# Patient Record
Sex: Female | Born: 1970 | Race: White | Hispanic: No | Marital: Married
Health system: Midwestern US, Community
[De-identification: ages and names within clinical notes are randomized; demographics above are authoritative.]

## PROBLEM LIST (undated history)

## (undated) DIAGNOSIS — C549 Malignant neoplasm of corpus uteri, unspecified: Secondary | ICD-10-CM

## (undated) DIAGNOSIS — D509 Iron deficiency anemia, unspecified: Secondary | ICD-10-CM

## (undated) DIAGNOSIS — R102 Pelvic and perineal pain: Secondary | ICD-10-CM

## (undated) DIAGNOSIS — IMO0001 Reserved for inherently not codable concepts without codable children: Secondary | ICD-10-CM

## (undated) DIAGNOSIS — R319 Hematuria, unspecified: Secondary | ICD-10-CM

## (undated) DIAGNOSIS — E119 Type 2 diabetes mellitus without complications: Secondary | ICD-10-CM

## (undated) DIAGNOSIS — F419 Anxiety disorder, unspecified: Secondary | ICD-10-CM

## (undated) DIAGNOSIS — Z1231 Encounter for screening mammogram for malignant neoplasm of breast: Secondary | ICD-10-CM

## (undated) DIAGNOSIS — E038 Other specified hypothyroidism: Secondary | ICD-10-CM

## (undated) DIAGNOSIS — K219 Gastro-esophageal reflux disease without esophagitis: Secondary | ICD-10-CM

## (undated) DIAGNOSIS — M5136 Other intervertebral disc degeneration, lumbar region: Secondary | ICD-10-CM

## (undated) DIAGNOSIS — F32A Depression, unspecified: Secondary | ICD-10-CM

## (undated) MED ORDER — TRIAMCINOLONE ACETONIDE 0.1 % TOPICAL CREAM
0.1 % | Freq: Two times a day (BID) | CUTANEOUS | Status: DC
Start: ? — End: 2011-12-24

## (undated) MED ORDER — IBUPROFEN 800 MG TAB
800 mg | ORAL_TABLET | Freq: Three times a day (TID) | ORAL | Status: DC | PRN
Start: ? — End: 2012-08-13

## (undated) MED ORDER — SERTRALINE 100 MG TAB
100 mg | ORAL_TABLET | Freq: Every day | ORAL | Status: DC
Start: ? — End: 2013-05-29

## (undated) MED ORDER — HYDROCODONE-ACETAMINOPHEN 5 MG-325 MG TAB
5-325 mg | ORAL_TABLET | Freq: Four times a day (QID) | ORAL | Status: DC | PRN
Start: ? — End: 2012-02-25

## (undated) MED ORDER — GLIPIZIDE 5 MG TAB
5 mg | ORAL_TABLET | ORAL | Status: DC
Start: ? — End: 2012-11-27

## (undated) MED ORDER — METHYLPREDNISOLONE 4 MG TABS IN A DOSE PACK
4 mg | PACK | ORAL | Status: DC
Start: ? — End: 2012-02-05

## (undated) MED ORDER — HYDROCODONE-ACETAMINOPHEN 5 MG-325 MG TAB
5-325 mg | ORAL_TABLET | Freq: Three times a day (TID) | ORAL | Status: DC | PRN
Start: ? — End: 2012-07-01

## (undated) MED ORDER — GLIPIZIDE 5 MG TAB
5 mg | ORAL_TABLET | Freq: Two times a day (BID) | ORAL | Status: DC
Start: ? — End: 2012-04-11

## (undated) MED ORDER — GLIPIZIDE 5 MG TAB
5 mg | ORAL_TABLET | ORAL | Status: DC
Start: ? — End: 2012-10-31

## (undated) MED ORDER — PHENAZOPYRIDINE 200 MG TAB
200 mg | ORAL_TABLET | Freq: Three times a day (TID) | ORAL | Status: AC
Start: ? — End: 2012-08-23

## (undated) MED ORDER — CEPHALEXIN 500 MG CAP
500 mg | ORAL_CAPSULE | Freq: Four times a day (QID) | ORAL | Status: AC
Start: ? — End: 2012-08-28

## (undated) MED ORDER — LIDOCAINE HCL 1 % (10 MG/ML) IJ SOLN
10 mg/mL (1 %) | Freq: Once | INTRAMUSCULAR | Status: AC
Start: ? — End: 2012-08-13

## (undated) MED ORDER — FERROUS SULFATE 325 MG (65 MG ELEMENTAL IRON) TAB
325 mg (65 mg iron) | ORAL_TABLET | Freq: Two times a day (BID) | ORAL | Status: DC
Start: ? — End: 2011-12-07

## (undated) MED ORDER — CELECOXIB 100 MG CAP
100 mg | ORAL_CAPSULE | Freq: Two times a day (BID) | ORAL | Status: DC
Start: ? — End: 2013-01-02

## (undated) MED ORDER — SERTRALINE 50 MG TAB
50 mg | ORAL_TABLET | Freq: Every day | ORAL | Status: DC
Start: ? — End: 2012-03-01

## (undated) MED ORDER — METFORMIN 1,000 MG TAB
1000 mg | ORAL_TABLET | Freq: Two times a day (BID) | ORAL | Status: DC
Start: ? — End: 2012-11-27

## (undated) MED ORDER — HYDROCODONE-ACETAMINOPHEN 5 MG-325 MG TAB
5-325 mg | ORAL_TABLET | ORAL | Status: DC | PRN
Start: ? — End: 2013-05-01

## (undated) MED ORDER — GLIPIZIDE 5 MG TAB
5 mg | ORAL_TABLET | ORAL | Status: DC
Start: ? — End: 2012-09-24

## (undated) MED ORDER — HYDROCODONE-ACETAMINOPHEN 5 MG-325 MG TAB
5-325 mg | ORAL_TABLET | ORAL | Status: DC | PRN
Start: ? — End: 2012-07-01

## (undated) MED ORDER — OXYCODONE 10 MG TAB
10 mg | ORAL_TABLET | ORAL | Status: DC | PRN
Start: ? — End: 2012-02-25

## (undated) MED ORDER — CYCLOBENZAPRINE 10 MG TAB
10 mg | ORAL_TABLET | Freq: Three times a day (TID) | ORAL | Status: AC | PRN
Start: ? — End: 2012-10-11

## (undated) MED ORDER — ALPRAZOLAM 0.25 MG TAB
0.25 mg | ORAL_TABLET | ORAL | Status: DC
Start: ? — End: 2012-07-01

## (undated) MED ORDER — CLONAZEPAM 0.5 MG TAB
0.5 mg | ORAL_TABLET | Freq: Two times a day (BID) | ORAL | Status: DC
Start: ? — End: 2012-11-27

## (undated) MED ORDER — SERTRALINE 100 MG TAB
100 mg | ORAL_TABLET | Freq: Every day | ORAL | Status: DC
Start: ? — End: 2013-03-25

## (undated) MED ORDER — SERTRALINE 100 MG TAB
100 mg | ORAL_TABLET | Freq: Every day | ORAL | Status: DC
Start: ? — End: 2012-11-27

## (undated) MED ORDER — ESOMEPRAZOLE MAGNESIUM 20 MG ORAL SUSP, DELAYED RELEASE
20 mg | PACK | Freq: Every day | ORAL | Status: DC
Start: ? — End: 2012-04-29

## (undated) MED ORDER — METHYLPREDNISOLONE 4 MG TABS IN A DOSE PACK
4 mg | PACK | ORAL | Status: DC
Start: ? — End: 2013-03-25

## (undated) MED ORDER — ALPRAZOLAM 0.25 MG TAB
0.25 mg | ORAL_TABLET | Freq: Three times a day (TID) | ORAL | Status: DC | PRN
Start: ? — End: 2012-05-24

## (undated) MED ORDER — TRIMETHOPRIM-SULFAMETHOXAZOLE 160 MG-800 MG TAB
160-800 mg | ORAL_TABLET | Freq: Two times a day (BID) | ORAL | Status: AC
Start: ? — End: 2012-01-07

## (undated) MED ORDER — FLUTICASONE-SALMETEROL 250 MCG-50 MCG/DOSE DISK DEVICE FOR INHALATION
250-50 mcg/dose | Freq: Two times a day (BID) | RESPIRATORY_TRACT | Status: DC
Start: ? — End: 2013-03-20

## (undated) MED ORDER — DEXAMETHASONE SODIUM PHOSPHATE 4 MG/ML IJ SOLN
4 mg/mL | Freq: Once | INTRAMUSCULAR | Status: AC
Start: ? — End: 2012-09-08

## (undated) MED ORDER — HYDROCODONE-ACETAMINOPHEN 5 MG-325 MG TAB
5-325 mg | ORAL_TABLET | Freq: Three times a day (TID) | ORAL | Status: DC | PRN
Start: ? — End: 2012-09-26

## (undated) MED ORDER — HYDROMORPHONE 2 MG TAB
2 mg | ORAL_TABLET | Freq: Four times a day (QID) | ORAL | Status: DC | PRN
Start: ? — End: 2012-03-06

## (undated) MED ORDER — GLIPIZIDE 5 MG TAB
5 mg | ORAL_TABLET | Freq: Two times a day (BID) | ORAL | Status: DC
Start: ? — End: 2012-04-22

## (undated) MED ORDER — KETOROLAC TROMETHAMINE 60 MG/2 ML IM
60 mg/2 mL | Freq: Once | INTRAMUSCULAR | Status: AC
Start: ? — End: 2011-12-20

## (undated) MED ORDER — SERTRALINE 100 MG TAB
100 mg | ORAL_TABLET | Freq: Every day | ORAL | Status: DC
Start: ? — End: 2012-10-31

## (undated) MED ORDER — PREDNISONE 10 MG TAB
10 mg | ORAL_TABLET | Freq: Every day | ORAL | Status: DC
Start: ? — End: 2013-05-01

## (undated) MED ORDER — HYDROCODONE-ACETAMINOPHEN 5 MG-325 MG TAB
5-325 mg | ORAL_TABLET | ORAL | Status: DC | PRN
Start: ? — End: 2013-03-25

## (undated) MED ORDER — DIAZEPAM 5 MG TAB
5 mg | ORAL_TABLET | Freq: Four times a day (QID) | ORAL | Status: DC | PRN
Start: ? — End: 2012-02-05

## (undated) MED ORDER — SERTRALINE 50 MG TAB
50 mg | ORAL_TABLET | ORAL | Status: DC
Start: ? — End: 2012-04-22

## (undated) MED ORDER — METFORMIN 1,000 MG TAB
1000 mg | ORAL_TABLET | Freq: Two times a day (BID) | ORAL | Status: DC
Start: ? — End: 2013-03-25

## (undated) MED ORDER — SERTRALINE 100 MG TAB
100 mg | ORAL_TABLET | ORAL | Status: DC
Start: ? — End: 2012-05-24

## (undated) MED ORDER — ALPRAZOLAM 0.25 MG TAB
0.25 mg | ORAL_TABLET | Freq: Three times a day (TID) | ORAL | Status: DC | PRN
Start: ? — End: 2012-04-04

## (undated) MED ORDER — TRIMETHOPRIM-SULFAMETHOXAZOLE 160 MG-800 MG TAB
160-800 mg | ORAL_TABLET | Freq: Two times a day (BID) | ORAL | Status: AC
Start: ? — End: 2012-02-24

## (undated) MED ORDER — HYDROCODONE-ACETAMINOPHEN 5 MG-325 MG TAB
5-325 mg | ORAL_TABLET | ORAL | Status: DC | PRN
Start: ? — End: 2012-10-08

## (undated) MED ORDER — SERTRALINE 100 MG TAB
100 mg | ORAL_TABLET | ORAL | Status: DC
Start: ? — End: 2012-07-11

## (undated) MED ORDER — FLUTICASONE 50 MCG/ACTUATION NASAL SPRAY, SUSP
50 mcg/actuation | NASAL | Status: DC
Start: ? — End: 2013-01-08

## (undated) MED ORDER — BETAMETHASONE ACET & SOD PHOS 6 MG/ML SUSP FOR INJECTION
6 mg/mL | Freq: Once | INTRAMUSCULAR | Status: AC
Start: ? — End: 2012-08-13

## (undated) MED ORDER — HYDROCODONE-ACETAMINOPHEN 5 MG-325 MG TAB
5-325 mg | ORAL_TABLET | ORAL | Status: DC | PRN
Start: ? — End: 2012-02-25

## (undated) MED ORDER — FERROUS SULFATE 325 MG (65 MG ELEMENTAL IRON) TAB
325 mg (65 mg iron) | ORAL_TABLET | Freq: Three times a day (TID) | ORAL | Status: AC
Start: ? — End: 2012-01-06

## (undated) MED ORDER — ALPRAZOLAM 0.25 MG TAB
0.25 mg | ORAL_TABLET | Freq: Three times a day (TID) | ORAL | Status: DC | PRN
Start: ? — End: 2012-09-24

## (undated) MED ORDER — ESOMEPRAZOLE MAGNESIUM 20 MG CAP, DELAYED RELEASE: 20 mg | ORAL_CAPSULE | Freq: Every day | ORAL | Status: AC

## (undated) MED ORDER — METFORMIN 1,000 MG TAB
1000 mg | ORAL_TABLET | Freq: Two times a day (BID) | ORAL | Status: DC
Start: ? — End: 2012-10-31

## (undated) MED ORDER — OXYCODONE-ACETAMINOPHEN 5 MG-325 MG TAB
5-325 mg | ORAL_TABLET | ORAL | Status: DC | PRN
Start: ? — End: 2011-12-07

## (undated) MED ORDER — CLONAZEPAM 0.5 MG TAB
0.5 mg | ORAL_TABLET | Freq: Two times a day (BID) | ORAL | Status: DC
Start: ? — End: 2013-02-06

## (undated) MED ORDER — ESOMEPRAZOLE MAGNESIUM 20 MG ORAL SUSP, DELAYED RELEASE
20 mg | PACK | Freq: Every day | ORAL | Status: DC
Start: ? — End: 2012-05-01

## (undated) MED ORDER — METFORMIN 1,000 MG TAB
1000 mg | ORAL_TABLET | Freq: Two times a day (BID) | ORAL | Status: DC
Start: ? — End: 2012-04-22

## (undated) MED ORDER — CLONAZEPAM 0.5 MG TAB
0.5 mg | ORAL_TABLET | Freq: Two times a day (BID) | ORAL | Status: DC
Start: ? — End: 2012-09-24

## (undated) MED ORDER — GLIPIZIDE 5 MG TAB
5 mg | ORAL_TABLET | ORAL | Status: DC
Start: ? — End: 2013-03-25

## (undated) MED ORDER — SERTRALINE 100 MG TAB
100 mg | ORAL_TABLET | Freq: Every day | ORAL | Status: DC
Start: ? — End: 2012-09-24

## (undated) MED ORDER — CLONAZEPAM 0.5 MG TAB
0.5 mg | ORAL_TABLET | Freq: Two times a day (BID) | ORAL | Status: DC
Start: ? — End: 2013-03-25

## (undated) MED ORDER — METFORMIN 1,000 MG TAB
1000 mg | ORAL_TABLET | Freq: Two times a day (BID) | ORAL | Status: DC
Start: ? — End: 2012-09-24

## (undated) MED ORDER — GABAPENTIN 300 MG CAP
300 mg | ORAL_CAPSULE | Freq: Three times a day (TID) | ORAL | Status: DC
Start: ? — End: 2013-05-29

## (undated) MED ORDER — SERTRALINE 100 MG TAB
100 mg | ORAL_TABLET | Freq: Every day | ORAL | Status: DC
Start: ? — End: 2012-08-20

## (undated) MED ORDER — GLIPIZIDE 5 MG TAB
5 mg | ORAL_TABLET | Freq: Two times a day (BID) | ORAL | Status: DC
Start: ? — End: 2012-08-20

## (undated) MED ORDER — FLUCONAZOLE 150 MG TAB
150 mg | ORAL_TABLET | ORAL | Status: AC
Start: ? — End: 2012-02-17

## (undated) MED ORDER — OXYCODONE-ACETAMINOPHEN 7.5 MG-325 MG TAB
ORAL_TABLET | ORAL | Status: DC | PRN
Start: ? — End: 2012-02-05

## (undated) MED ORDER — TRAMADOL 50 MG TAB
50 mg | ORAL_TABLET | Freq: Four times a day (QID) | ORAL | Status: DC | PRN
Start: ? — End: 2012-01-11

## (undated) MED ORDER — SERTRALINE 50 MG TAB
50 mg | ORAL_TABLET | ORAL | Status: DC
Start: ? — End: 2012-04-04

## (undated) MED ORDER — PROMETHAZINE 25 MG TAB
25 mg | ORAL_TABLET | Freq: Four times a day (QID) | ORAL | Status: DC | PRN
Start: ? — End: 2012-07-01

## (undated) MED ORDER — BLOOD SUGAR DIAGNOSTIC TEST STRIPS
PACK | Status: DC
Start: ? — End: 2013-03-20

## (undated) MED ORDER — CLONAZEPAM 0.5 MG TAB
0.5 mg | ORAL_TABLET | Freq: Two times a day (BID) | ORAL | Status: DC
Start: ? — End: 2012-10-31

## (undated) MED ORDER — METFORMIN 1,000 MG TAB
1000 mg | ORAL_TABLET | Freq: Two times a day (BID) | ORAL | Status: DC
Start: ? — End: 2013-05-29

## (undated) MED ORDER — DOXYCYCLINE HYCLATE 100 MG TAB
100 mg | ORAL_TABLET | Freq: Two times a day (BID) | ORAL | Status: AC
Start: ? — End: 2011-12-31

## (undated) MED ORDER — DIAZEPAM 5 MG TAB
5 mg | ORAL_TABLET | Freq: Four times a day (QID) | ORAL | Status: DC | PRN
Start: ? — End: 2012-01-11

## (undated) MED ORDER — PROMETHAZINE 25 MG TAB
25 mg | ORAL_TABLET | Freq: Four times a day (QID) | ORAL | Status: DC | PRN
Start: ? — End: 2012-02-05

## (undated) MED ORDER — BETAMETHASONE ACET & SOD PHOS 6 MG/ML SUSP FOR INJECTION
6 mg/mL | Freq: Once | INTRAMUSCULAR | Status: AC
Start: ? — End: 2012-09-08

---

## 2010-01-13 LAB — METABOLIC PANEL, COMPREHENSIVE
A-G Ratio: 1 — ABNORMAL LOW (ref 1.1–2.2)
ALT (SGPT): 48 U/L (ref 12–78)
AST (SGOT): 26 U/L (ref 15–37)
Albumin: 3.5 g/dL (ref 3.5–5.0)
Alk. phosphatase: 108 U/L (ref 50–136)
Anion gap: 6 mmol/L (ref 5–15)
BUN/Creatinine ratio: 10 — ABNORMAL LOW (ref 12–20)
BUN: 8 MG/DL (ref 6–20)
Bilirubin, total: 0.2 MG/DL (ref 0.2–1.0)
CO2: 29 MMOL/L (ref 21–32)
Calcium: 9.2 MG/DL (ref 8.5–10.1)
Chloride: 101 MMOL/L (ref 97–108)
Creatinine: 0.8 MG/DL (ref 0.6–1.3)
GFR est AA: >60 mL/min/{1.73_m2} (ref >60)
GFR est non-AA: >60 mL/min/{1.73_m2} (ref >60)
Globulin: 3.6 g/dL (ref 2.0–4.0)
Glucose: 256 MG/DL — ABNORMAL HIGH (ref 65–100)
Potassium: 3.5 MMOL/L (ref 3.5–5.1)
Protein, total: 7.1 g/dL (ref 6.4–8.2)
Sodium: 136 MMOL/L (ref 136–145)

## 2010-01-13 LAB — CBC WITH AUTOMATED DIFF
ABS. BASOPHILS: 0 10*3/uL (ref 0.0–0.1)
ABS. EOSINOPHILS: 0.3 10*3/uL (ref 0.0–0.4)
ABS. LYMPHOCYTES: 2.9 10*3/uL (ref 0.8–3.5)
ABS. MONOCYTES: 0.4 10*3/uL (ref 0.0–1.0)
ABS. NEUTROPHILS: 6.5 10*3/uL (ref 1.8–8.0)
BASOPHILS: 0 % (ref 0–1)
EOSINOPHILS: 3 % (ref 0–7)
HCT: 38.2 % (ref 35.0–47.0)
HGB: 12.6 g/dL (ref 11.5–16.0)
LYMPHOCYTES: 29 % (ref 12–49)
MCH: 26.4 PG (ref 26.0–34.0)
MCHC: 33 g/dL (ref 30.0–36.5)
MCV: 79.9 FL — ABNORMAL LOW (ref 80.0–99.0)
MONOCYTES: 4 % — ABNORMAL LOW (ref 5–13)
NEUTROPHILS: 64 % (ref 32–75)
PLATELET: 284 10*3/uL (ref 150–400)
RBC: 4.78 M/uL (ref 3.80–5.20)
RDW: 14.4 % (ref 11.5–14.5)
WBC: 10 10*3/uL (ref 3.6–11.0)

## 2010-01-13 LAB — URINALYSIS W/ REFLEX CULTURE
Bilirubin: NEGATIVE
Glucose: >1000 MG/DL — AB
Ketone: NEGATIVE MG/DL
Leukocyte Esterase: NEGATIVE
Nitrites: NEGATIVE
Protein: NEGATIVE MG/DL
Specific gravity: >1.03 — ABNORMAL HIGH (ref 1.003–1.030)
Urobilinogen: 1 EU/DL (ref 0.2–1.0)
pH (UA): 6 (ref 5.0–8.0)

## 2010-01-13 LAB — GLUCOSE, POC: Glucose (POC): 300 mg/dL — ABNORMAL HIGH (ref 65–105)

## 2010-01-13 MED ORDER — TRAMADOL 50 MG TAB
50 mg | ORAL_TABLET | Freq: Four times a day (QID) | ORAL | Status: DC | PRN
Start: 2010-01-13 — End: 2010-12-20

## 2010-01-13 MED ORDER — TRAMADOL 50 MG TAB
50 mg | ORAL | Status: AC
Start: 2010-01-13 — End: 2010-01-13
  Administered 2010-01-13: 10:00:00 via ORAL

## 2010-01-13 MED ORDER — INSULIN REGULAR HUMAN 100 UNIT/ML INJECTION
100 unit/mL | INTRAMUSCULAR | Status: AC
Start: 2010-01-13 — End: 2010-01-13
  Administered 2010-01-13: 10:00:00 via INTRAVENOUS

## 2010-01-13 MED ADMIN — sodium chloride (NS) 0.9 % flush: @ 08:00:00 | NDC 87701099893

## 2010-01-13 NOTE — ED Provider Notes (Signed)
HPI Comments: This is a 39yo female that presents ambulatory to ED c/o feeling weak and drained. Patient has also been urinating more during the night.  She denies any increased thirst recently but she has been getting dizzy upon standing. Pt has a hx of DM; she reports her blood sugars levels to bet 130s at home today.  Pt is compliant with all of her medications and denies that she has cheated on her diet.     Hx: DM, Asthma  There are no other complaints, changes or physical findings at this time.   Written by Loyal Jacobson, ED Scribe, as dictated by Garnette Scheuermann, MD          The history is provided by the patient.        Past Medical History   Diagnosis Date   ??? Diabetes    ??? Asthma           Past Surgical History   Procedure Date   ??? Hx orthopaedic      back   ??? Hx appendectomy    ??? Hx gyn      tubal           No family history on file.     History   Social History   ??? Marital Status: Married     Spouse Name: N/A     Number of Children: N/A   ??? Years of Education: N/A   Occupational History   ??? Not on file.   Social History Main Topics   ??? Smoking status: Current Everyday Smoker   ??? Smokeless tobacco: Not on file   ??? Alcohol Use:    ??? Drug Use:    ??? Sexually Active:    Other Topics Concern   ??? Not on file   Social History Narrative   ??? No narrative on file                    ALLERGIES: Erythromycin, Pcn, Macrolide antibiotics and Levaquin      Review of Systems   Constitutional: Negative for fever and chills.   HENT: Negative.  Negative for sore throat, neck pain and neck stiffness.    Eyes: Negative.    Respiratory: Negative.  Negative for cough and shortness of breath.    Cardiovascular: Negative.  Negative for chest pain.   Gastrointestinal: Negative.  Negative for nausea, vomiting, abdominal pain and diarrhea.   Genitourinary: Positive for frequency. Negative for dysuria and urgency.   Musculoskeletal: Negative.  Negative for back pain and arthralgias.   Skin: Negative.  Negative for rash and wound.    Neurological: Positive for dizziness and weakness. Negative for light-headedness and headaches.   All other systems reviewed and are negative.        Filed Vitals:    01/13/10 0406   BP: 144/80   Pulse: 80   Temp: 98.2 ??F (36.8 ??C)   Resp: 16   Height: 5\' 5"  (1.651 m)   Weight: 248 lb 14.4 oz (112.9 kg)   SpO2: 95%              Physical Exam   Nursing note and vitals reviewed.  Constitutional: She is oriented to person, place, and time. She appears well-developed and well-nourished. No distress.   HENT:   Head: Normocephalic and atraumatic.   Eyes: EOM are normal. Pupils are equal, round, and reactive to light.   Neck: Normal range of motion. Neck supple.   Cardiovascular: Normal  rate, regular rhythm, normal heart sounds and intact distal pulses.  Exam reveals no friction rub.    No murmur heard.  Pulmonary/Chest: Effort normal and breath sounds normal. No respiratory distress. She has no wheezes. She has no rales. She exhibits no tenderness.   Abdominal: Soft. Bowel sounds are normal. She exhibits no distension. No tenderness. She has no rebound and no guarding.   Musculoskeletal: Normal range of motion. She exhibits no edema and no tenderness.   Neurological: She is alert and oriented to person, place, and time. She exhibits normal muscle tone. Coordination normal.   Skin: Skin is warm and dry. She is not diaphoretic. No pallor.   Psychiatric: She has a normal mood and affect. Her behavior is normal.        MDM    Procedures    5:45 AM   The patient's results have been reviewed with them.  Patient and/or family, verbally conveyed their understanding and agreement of the patient's signs, symptoms, diagnosis, treatment and prognosis and additionally agree to follow up as recommended or return to the Emergency Room should their condition change prior to their follow-up appointment. The patient verbally agrees with the care-plan and verbally conveys that all of their questions have been answered.   The discharge instructions have also been provided to the patient with some educational information regarding their diagnosis as well a list of reasons why they would want to return to the ER prior to their follow-up appointment, should their condition change.    Written by Loyal Jacobson, ED Scribe, as dictated by Garnette Scheuermann, MD

## 2010-01-13 NOTE — Progress Notes (Signed)
Pt verbalized understanding of all d/c info given by dr. Marinus Maw.  Pt ambulatory to waiting room for friend to drive her home.  Pt refused w/c.

## 2010-01-13 NOTE — ED Notes (Signed)
Pt resting at present with eyes closed and room darkened.  Awaiting test results.  Family at bedside.  Will cont. To observe.

## 2010-01-13 NOTE — ED Notes (Signed)
Dr. Marinus Maw aware of pt requesting pain med for lower back pain.

## 2010-01-14 NOTE — Progress Notes (Signed)
Quick Note:    Pt allergic to pcn/ceph, macrolides, and quinolones; contaminated urine; will await s and s before any rx;  ______

## 2010-01-15 LAB — CULTURE, URINE
Colonies Counted: >100000
Colony Count: >100000

## 2010-01-15 NOTE — Progress Notes (Signed)
Quick Note:    Left message on phone, needs macrobid  ______

## 2010-12-20 MED ORDER — TRIMETHOPRIM-SULFAMETHOXAZOLE 160 MG-800 MG TAB
160-800 mg | ORAL_TABLET | Freq: Two times a day (BID) | ORAL | Status: AC
Start: 2010-12-20 — End: 2010-12-27

## 2010-12-20 MED ORDER — TRAMADOL 50 MG TAB
50 mg | ORAL_TABLET | Freq: Four times a day (QID) | ORAL | Status: DC | PRN
Start: 2010-12-20 — End: 2011-07-10

## 2010-12-20 NOTE — ED Provider Notes (Signed)
I have personally seen and evaluated patient. I find the patient's history and physical exam are consistent with the PA's NP documentation. I agree with the care provided, treatments rendered, disposition and follow up plan.

## 2010-12-20 NOTE — ED Provider Notes (Signed)
Patient is a 40 y.o. female presenting with foot pain. The history is provided by the patient. No language interpreter was used.   Foot Pain   This is a recurrent problem. The current episode started more than 1 week ago. The problem occurs constantly. The problem has not changed since onset.The pain is present in the left foot. The pain is moderate. Associated symptoms include tingling. Pertinent negatives include full range of motion, no back pain and no neck pain. She has tried OTC ointments for the symptoms. There has been no history of extremity trauma.        Past Medical History   Diagnosis Date   ??? Diabetes    ??? Asthma         Past Surgical History   Procedure Date   ??? Hx orthopaedic      back   ??? Hx appendectomy    ??? Hx gyn      tubal         No family history on file.     History     Social History   ??? Marital Status: Married     Spouse Name: N/A     Number of Children: N/A   ??? Years of Education: N/A     Occupational History   ??? Not on file.     Social History Main Topics   ??? Smoking status: Former Smoker   ??? Smokeless tobacco: Not on file   ??? Alcohol Use: No   ??? Drug Use:    ??? Sexually Active:      Other Topics Concern   ??? Not on file     Social History Narrative   ??? No narrative on file                  ALLERGIES: Erythromycin; Levaquin; Macrolide antibiotics; and Pcn      Review of Systems   Constitutional: Negative for diaphoresis and fatigue.   HENT: Negative for neck pain and neck stiffness.    Eyes: Negative for visual disturbance.   Respiratory: Negative for cough.    Cardiovascular: Negative for chest pain.   Musculoskeletal: Positive for myalgias. Negative for back pain and arthralgias.   Skin: Negative for rash.        Foot redness blister   Neurological: Positive for tingling. Negative for light-headedness.   Hematological: Negative for adenopathy.   Psychiatric/Behavioral: Negative for behavioral problems and agitation.   All other systems reviewed and are negative.        Filed Vitals:     12/20/10 1537   BP: 127/69   Pulse: 70   Temp: 98.4 ??F (36.9 ??C)   Resp: 16   Height: 5' 5.5" (1.664 m)   Weight: 104.327 kg (230 lb)   SpO2: 98%            Physical Exam   Constitutional: She is oriented to person, place, and time. She appears well-developed and well-nourished.   HENT:   Head: Normocephalic and atraumatic.   Eyes: Conjunctivae are normal.   Neck: Normal range of motion. Neck supple.   Cardiovascular: Normal rate, regular rhythm and normal heart sounds.    Pulmonary/Chest: Effort normal and breath sounds normal. No respiratory distress.   Abdominal: Soft. Bowel sounds are normal. There is no tenderness.   Musculoskeletal: Normal range of motion. She exhibits tenderness.        Feet:         Red scaly inflammed area  Lymphadenopathy:     She has no cervical adenopathy.   Neurological: She is alert and oriented to person, place, and time.   Skin: Skin is warm and dry.   Psychiatric: She has a normal mood and affect. Her behavior is normal. Thought content normal.        MDM    Procedures

## 2010-12-20 NOTE — ED Notes (Signed)
Patient (s)  given copy of dc instructions and 2 script(s).  Patient (s)  verbalized understanding of instructions and script (s).  Patient given a current medication reconciliation form and verbalized understanding of their medications.   Patient (s) verbalized understanding of the importance of discussing medications with  his or her physician or clinic they will be following up with.  Patient alert and oriented and in no acute distress.  Patient discharged home ambulatory with self.

## 2011-07-10 LAB — AMB POC URINE, MICROALBUMIN, SEMIQUANT (3 RESULTS)
ALBUMIN, URINE POC: 20 mg/L
CREATININE, URINE POC: 200 mg/dL
Microalbumin/creat ratio (POC): <30 mg/g

## 2011-07-10 LAB — AMB POC HEMOGLOBIN A1C: Hemoglobin A1c (POC): 6.9

## 2011-07-10 MED ORDER — METFORMIN 1,000 MG TAB
1000 mg | ORAL_TABLET | Freq: Two times a day (BID) | ORAL | Status: DC
Start: 2011-07-10 — End: 2011-08-29

## 2011-07-10 MED ORDER — GLIPIZIDE 5 MG TAB
5 mg | ORAL_TABLET | Freq: Two times a day (BID) | ORAL | Status: DC
Start: 2011-07-10 — End: 2011-08-29

## 2011-07-10 NOTE — Progress Notes (Signed)
Chief Complaint   Patient presents with   ??? ED Follow-up   ??? Establish Care     Reviewed record in preparation for visit and have obtained the necessary documentation.

## 2011-07-10 NOTE — Progress Notes (Signed)
HISTORY OF PRESENT ILLNESS  Adamarie Duffus is a 41 y.o. female.  HPI Comments: 41 year old female here as new patient to our practice.  She is following up from a ER visit to [facility] for abdominal pain.  Patient states that she was diagnosed with uterine fibroids by a CT scan but forgot the results today at home.  Patient states that she thought she was going to see an Ob-Gyn today and not a primary care and states that she would like to see one as soon as possible as she has only slept a few hours each night since the pain has worsened.    Patient also has the following history   Patient Active Problem List:     Fibroids (218.9)     Depression (311)     Hyperlipidemia (272.4)     Diabetes mellitus (250.00)     GERD (gastroesophageal reflux disease) (530.81)    Patient states that she forgot her medications at home and takes medication for her diabetes and depression but does not recall the exact dosages and does not know what her last diabetes check showed. She states that she used to go to a NP in [place].    From what history i can elicit, she takes the following medications:  Current Outpatient Prescriptions:  metFORMIN (GLUCOPHAGE) 1,000 mg tablet, Take 1 Tab by mouth two (2) times daily (with meals).  GLIPIZIDE PO, Take 1 Tab by mouth daily.            ED Follow-up  The history is provided by the patient. Associated symptoms include abdominal pain. Pertinent negatives include no headaches.   Establish Care  Associated symptoms include abdominal pain. Pertinent negatives include no headaches.       Review of Systems   Constitutional: Negative for fever, chills and weight loss.   Eyes: Negative for blurred vision.   Gastrointestinal: Positive for heartburn, nausea and abdominal pain. Negative for vomiting, diarrhea and constipation.   Neurological: Negative for dizziness, tingling and headaches.   Psychiatric/Behavioral: Positive for depression. Negative for suicidal ideas.       Physical Exam   Nursing note and  vitals reviewed.  Constitutional: She is oriented to person, place, and time. She appears well-developed and well-nourished.        Patient has a very bad odor and is a poor historian   HENT:   Head: Normocephalic.   Right Ear: External ear normal.   Left Ear: External ear normal.   Cardiovascular: Normal rate.    Pulmonary/Chest: Effort normal. She has no wheezes.   Neurological: She is alert and oriented to person, place, and time.   Skin: Skin is warm.   Psychiatric: She has a normal mood and affect.       ASSESSMENT and PLAN  1. Diabetes mellitus  metFORMIN (GLUCOPHAGE) 1,000 mg tablet, AMB POC HEMOGLOBIN A1C, CBC W/O DIFF, LIPID PANEL, AMB POC URINE, MICROALBUMIN, SEMIQUANT (3 RESULTS)   2. GERD (gastroesophageal reflux disease)     3. Hyperlipidemia  METABOLIC PANEL, COMPREHENSIVE, LIPID PANEL   it is very difficult to gauge exactly what this patient has done for her diabetes in the past and I am not sure of her medication regimen  A1C today is: 6.9%  Will have her take metformin 1000mg  BID and glipizide 5mg  BID  i will ask that she follow up with our Ob-Gyn's for evaluation and management of her fibroids  It is reiterated to her that she should bring a  copy of her CT scan with her to that appointment  i have also asked her to bring all of her medications with her to her next appointment so we can verify her medications.  She will require close follow up to better manage her chronic medical conditions    Patient is counseled to return to the office if symptoms do not improve as expected.  Urgent consultation with the nearest Emergency Department is strongly recommended if condition worsens.  Patient is counseled to follow up as recommended and to inform the office if any changes in treatment are recommended.

## 2011-07-11 LAB — CBC W/O DIFF
HCT: 32.7 % — ABNORMAL LOW (ref 34.0–46.6)
HGB: 10.3 g/dL — ABNORMAL LOW (ref 11.1–15.9)
MCH: 24.9 pg — ABNORMAL LOW (ref 26.6–33.0)
MCHC: 31.5 g/dL (ref 31.5–35.7)
MCV: 79 fL (ref 79–97)
PLATELET: 338 10*3/uL (ref 140–415)
RBC: 4.14 x10E6/uL (ref 3.77–5.28)
RDW: 14.2 % (ref 12.3–15.4)
WBC: 8 10*3/uL (ref 4.0–10.5)

## 2011-07-11 LAB — METABOLIC PANEL, COMPREHENSIVE
A-G Ratio: 1.6 (ref 1.1–2.5)
ALT (SGPT): 13 IU/L (ref 0–32)
AST (SGOT): 15 IU/L (ref 0–40)
Albumin: 4.2 g/dL (ref 3.5–5.5)
Alk. phosphatase: 75 IU/L (ref 25–150)
BUN/Creatinine ratio: 20 (ref 9–23)
BUN: 13 mg/dL (ref 6–24)
Bilirubin, total: 0.1 mg/dL (ref 0.0–1.2)
CO2: 24 mmol/L (ref 20–32)
Calcium: 8.7 mg/dL (ref 8.7–10.2)
Chloride: 99 mmol/L (ref 97–108)
Creatinine: 0.66 mg/dL (ref 0.57–1.00)
GFR est non-AA: 110 mL/min/{1.73_m2} (ref >59)
GLOBULIN, TOTAL: 2.7 g/dL (ref 1.5–4.5)
Glucose: 188 mg/dL — ABNORMAL HIGH (ref 65–99)
Potassium: 4.4 mmol/L (ref 3.5–5.2)
Protein, total: 6.9 g/dL (ref 6.0–8.5)
Sodium: 139 mmol/L (ref 134–144)
eGFR If African American: 127 mL/min/{1.73_m2} (ref >59)

## 2011-07-11 LAB — LIPID PANEL
Cholesterol, total: 152 mg/dL (ref 100–199)
HDL Cholesterol: 49 mg/dL (ref >39)
LDL, calculated: 69 mg/dL (ref 0–99)
Triglyceride: 172 mg/dL — ABNORMAL HIGH (ref 0–149)
VLDL, calculated: 34 mg/dL (ref 5–40)

## 2011-07-11 LAB — CVD REPORT: PDF IMAGE: 0

## 2011-07-11 MED ORDER — TRAMADOL-ACETAMINOPHEN 37.5 MG-325 MG TAB
ORAL_TABLET | Freq: Four times a day (QID) | ORAL | Status: DC | PRN
Start: 2011-07-11 — End: 2012-01-11

## 2011-07-11 MED ORDER — MEDROXYPROGESTERONE 5 MG TAB
5 mg | ORAL_TABLET | ORAL | Status: DC
Start: 2011-07-11 — End: 2011-12-26

## 2011-07-11 NOTE — Patient Instructions (Addendum)
Uterine Fibroids: After Your Visit  Your Care Instructions  Uterine fibroids are growths in the uterus. Fibroids aren't cancer. Doctors don't know what causes fibroids. Fibroids are very common in women during their childbearing years.  Fibroids can grow on the inside of the uterus, in the muscle wall of the uterus, or near the outside wall of the uterus. In some women, fibroids cause painful cramps and heavy periods. In these cases, taking anti-inflammatory medicines and birth control pills often helps decrease symptoms. Sometimes surgery is needed to treat fibroids. But if you are near menopause, you may want to wait and see if your symptoms get better.  Most fibroids shrink and go away after menopause, when your menstrual periods stop completely.  Follow-up care is a key part of your treatment and safety. Be sure to make and go to all appointments, and call your doctor if you are having problems. It???s also a good idea to know your test results and keep a list of the medicines you take.  How can you care for yourself at home?  ?? If your doctor gave you medicine, take it as exactly as prescribed. Call your doctor if you think you are having a problem with your medicine.  ?? Take anti-inflammatory medicines for pain. These include ibuprofen (Advil, Motrin) and naproxen (Aleve). Read and follow all instructions on the label.  ?? Use heat, such as a hot water bottle or a heating pad set on low, or a warm bath to relax tense muscles and relieve cramping. Put a thin cloth between the heating pad and your skin. Never go to sleep with a heating pad on.  ?? Lie down and put a pillow under your knees. Or, lie on your side and bring your knees up to your chest. These positions may help relieve belly pain or pressure.  ?? Use sanitary pads instead of tampons. Keep track of how many you use each day.  ?? Get at least 30 minutes of exercise on most days of the week. Walking is a good choice. You also may want to do other  activities, such as running, swimming, cycling, or playing tennis or team sports.  ?? If you bleed for several days or have heavy bleeding, take a daily multivitamin with iron.  When should you call for help?  Call 911 anytime you think you may need emergency care. For example, call if:  ?? You passed out (lost consciousness).  ?? You have sudden, severe pain in your belly or pelvis.  Call your doctor now or seek immediate medical care if:  ?? You have severe vaginal bleeding. You are passing clots of blood and soaking through your usual pads every hour for 2 or more hours.  ?? You have new belly or pelvic pain.  ?? You are dizzy or lightheaded, or you feel like you may faint.  ?? You have new or unexpected vaginal bleeding.  Watch closely for changes in your health, and be sure to contact your doctor if:  ?? You have new vaginal discharge.  ?? You have ongoing heavy or irregular vaginal bleeding.  ?? You have pelvic pain or a heavy feeling in your lower belly that doesn't go away.  ?? You have to urinate often.  ?? You are more constipated than usual.   Where can you learn more?   Go to http://www.healthwise.net/[facility]  Enter B121 in the search box to learn more about "Uterine Fibroids: After Your Visit."   ?? 2006-2013 [employer].   Care instructions adapted under license by [employer] (which disclaims liability or warranty for this information). This care instruction is for use with your licensed healthcare professional. If you have questions about a medical condition or this instruction, always ask your healthcare professional. [employer], Inc[employer] disclaims any warranty or liability for your use of this information.  Content Version: 9.6.101520; Last Revised: October 05, 2009            Pelvic Pain: After Your Visit  Your Care Instructions  Pelvic pain, or pain in the lower belly, can have many causes. Often pelvic pain is not serious and gets better in a few days. If your pain continues or gets worse, you  may need tests and treatment. Tell your doctor about any new symptoms. These may be signs of a serious problem.  Follow-up care is a key part of your treatment and safety. Be sure to make and go to all appointments, and call your doctor if you are having problems. It???s also a good idea to know your test results and keep a list of the medicines you take.  How can you care for yourself at home?  ?? Rest until you feel better. Lie down, and raise your legs by placing a pillow under your knees.  ?? Drink plenty of fluids. You may find that small, frequent sips are easier on your stomach than if you drink a lot at once. Avoid drinks with carbonation or caffeine, such as soda pop, tea, or coffee.  ?? Try eating several small meals instead of 2 or 3 large ones. Eat mild foods, such as rice, dry toast or crackers, bananas, and applesauce. Avoid fatty and spicy foods, other fruits, and alcohol until 48 hours after your symptoms have gone away.  ?? Take an over-the-counter pain medicine, such as acetaminophen (Tylenol), ibuprofen (Advil, Motrin), or naproxen (Aleve). Read and follow all instructions on the label.  ?? Do not take two or more pain medicines at the same time unless the doctor told you to. Many pain medicines have acetaminophen, which is Tylenol. Too much acetaminophen (Tylenol) can be harmful.  ?? You can put a heating pad, a warm cloth, or moist heat on your belly to relieve pain.  When should you call for help?  Call 911 anytime you think you may need emergency care. For example, call if:  ?? You passed out (lost consciousness).  Call your doctor now or seek immediate medical care if:  ?? Your pain is getting worse.  ?? Your pain becomes focused in one area of your belly.  ?? You have severe vaginal bleeding. Severe means that you are soaking through your usual pads or tampons every hour for 2 or more hours and passing clots of blood.  ?? You have new symptoms such as fever, urinary problems or unexpected vaginal  bleeding.  ?? You are dizzy or lightheaded, or you feel like you may faint.  Watch closely for changes in your health, and be sure to contact your doctor if:  ?? You do not get better as expected.   Where can you learn more?   Go to [employer]  Enter B514 in the search box to learn more about "Pelvic Pain: After Your Visit."   ?? 2006-2013 Healthwise, [employer]. Care instructions adapted under license by [employer] (which disclaims liability or warranty for this information). This care instruction is for use with your licensed healthcare professional. If you have questions about a medical condition or this instruction, always  ask your healthcare professional. Healthwise, Inc[employer] disclaims any warranty or liability for your use of this information.  Content Version: 9.6.101520; Last Revised: June 17, 2009            Hormone Therapy (HT): After Your Visit  Your Care Instructions  Hormone therapy (HT) is medicine to treat symptoms of menopause, such as hot flashes, vaginal dryness, and sleep problems. It replaces the hormones that drop at menopause. Most women get relief from these symptoms within weeks of starting HT.  HT contains two female hormones, estrogen and progestin. HT may come in the form of a pill, a patch, or a vaginal ring. A vaginal cream or a vaginal ring that contains a much lower dose of estrogen may be used to relieve vaginal dryness only.  HT has some risks. Most doctors recommend that women only take HT for as short a time as possible to reduce the chances of heart disease, breast cancer, blood clots, and stroke that may be connected to HT. It is important to have regular checkups with your doctor when taking HT.  Talk with your doctor about whether HT is right for you. If you decide that the benefits of HT outweigh the risks, ask your doctor to prescribe the lowest effective dose for as short a time as possible.  Follow-up care is a key part of your treatment and  safety. Be sure to make and go to all appointments, and call your doctor if you are having problems. It's also a good idea to know your test results and keep a list of the medicines you take.  Why might you take HT?  ?? HT reduces symptoms of menopause, such as hot flashes, mood swings, and sleep problems.  ?? The estrogen in HT helps to prevent thinning bones and may lower the chance of colon cancer.  ?? HT helps keep the lining of the vagina moist and thick, which can reduce irritation.  ?? HT helps protect against dental problems, such as tooth loss and gum disease.  What are the risks of taking HT?  ?? Some women who take HT may have vaginal bleeding, bloating, nausea, sore breasts, mood swings, and headaches. Talk to your doctor about changing the type of HT you take or lowering the dose, which may help to end these side effects.  ?? Taking HT may slightly increase your risk for heart disease, breast cancer, ovarian cancer, blood clots, and stroke.  ?? You should not take HT if you:  ?? Could be pregnant.  ?? Have a personal history of breast cancer, endometrial cancer, pulmonary embolism, deep vein thrombosis, heart attack, or stroke.  ?? Have vaginal bleeding from an unknown cause.  ?? Have active liver disease.  What can you do to reduce the symptoms of menopause?  ?? Eat healthy foods and get regular exercise. This also will help to maintain strong bones and a healthy heart.  ?? Do not smoke. If you smoke, you can reduce hot flashes and long-term health risks by stopping. If you need help quitting, talk to your doctor about stop-smoking programs and medicines. These can increase your chances of quitting for good.  ?? Practice daily breathing exercises (meditation) to reduce hot flashes and mood swings.  ?? Limit the amount of alcohol you drink to reduce symptoms of menopause and long-term health risks.  ?? Keep your home and office cool.  ?? Use a vaginal lubricant, such as Astroglide, Wet Gel Lubricant, or K-Y Jelly.  ?? Do  pelvic floor (Kegel) exercises, which tighten and strengthen pelvic muscles. To do Kegel exercises:  ?? Squeeze the same muscles you would use to stop your urine. Your belly and rear end (buttocks) should not move.  ?? Hold the squeeze for 3 seconds, then relax for 3 seconds.  ?? Repeat the exercise 10 to 15 times a session. Do three or more sessions a day.   Where can you learn more?   Go to [email or web address]  Enter V552 in the search box to learn more about "Hormone Therapy (HT): After Your Visit."   ?? 2006-2013 Healthwise, [employer]. Care instructions adapted under license by [employer] (which disclaims liability or warranty for this information). This care instruction is for use with your licensed healthcare professional. If you have questions about a medical condition or this instruction, always ask your healthcare professional. [employer], [employer] disclaims any warranty or liability for your use of this information.  Content Version: 9.6.101520; Last Revised: September 12, 2009

## 2011-07-11 NOTE — Progress Notes (Signed)
HISTORY OF PRESENT ILLNESS  Jody [name] is a 41 y.o. female presents with pelvic pain s/p diagnosis of uterine fibroids.    HPI  Pt states that she has been to multiple hospitals including [facility] and [facility] recently diagnosed with uterine fibroids on CT scan.  She states she has been bleeding with menorrhagia intermittently since Nov.   She states that she has left lower quadrant pain that radiates to the back.  She states this is the first time that she has had irregular heavy menses, previous her menses were regular lasting 5-7 days without clots.  She is not currently bleeding.  She has taken tylenol and vicodin, given by the ED for the pain.  She states she cannot take take NSAIDs or aspirin due to possible gastritis.  However, she denies any issues of PUD or GI bleeding.   She currently does not smoke cigarettes.    Pt did not bring any records for review.      Review of Systems   All other systems reviewed and are negative.        Physical Exam   Vitals reviewed.  Constitutional: She appears well-developed and well-nourished. No distress.        Obese caucasian female, disheveled appearance   Cardiovascular: Normal rate, regular rhythm and normal heart sounds.  Exam reveals no gallop and no friction rub.    No murmur heard.  Pulmonary/Chest: Effort normal and breath sounds normal. No respiratory distress. She has no wheezes. She has no rales.   Abdominal: Soft. Bowel sounds are normal. She exhibits no distension. There is tenderness (+left sided abdominal pain from the inguinal region to the upper left quadrant). There is no rebound and no guarding.   Skin: She is not diaphoretic.       ASSESSMENT and PLAN  1. Pelvic pain in female  traMADol-acetaminophen (ULTRACET) 37.5-325 mg per tablet, [place] PELV NON OBS, US TRANSVAGINAL, medroxyPROGESTERone (PROVERA) 5 mg tablet   2. Fibroids  [place] PELV NON OBS, US TRANSVAGINAL, medroxyPROGESTERone (PROVERA) 5 mg tablet   3. GERD (gastroesophageal reflux disease)     4.  Menorrhagia  medroxyPROGESTERone (PROVERA) 5 mg tablet     Orders Placed This Encounter   ??? [place] PELV NON OBS   ??? US TRANSVAGINAL   ??? traMADol-acetaminophen (ULTRACET) 37.5-325 mg per tablet   ??? medroxyPROGESTERone (PROVERA) 5 mg tablet   uterine fibroids and pelvic pain-ultracet prn. Discussed with patient that her abdominal pain may not be related to her fibroids since it radiates up her left side. Will review records once available and pt may need eval for DUB.  However, she is currently not bleeding at this time.  Will also check CBC due to menometrorrhagia as well as ultrasound to further evaluate the extent of her uterine fibroids  Pt states she has not had an ultrasound in some years.  Offered provera to help with regulation of cycles as well.  Pt without any hx of DVT, coagulopathy and was previously on hormonal contraception without complications.

## 2011-07-11 NOTE — Progress Notes (Signed)
Presents s/p [facility] ED 06/25/11 for Heavy Menses and Abdominal Pain.  She states the CT Scan she had showed Uterine Fibroids.  LMP 03/07/11 - 07/08/11.  Last pap 06/15/11, negative. I have reviewed the record in preparation for visit and have obtained necessary documentation.

## 2011-07-16 NOTE — Telephone Encounter (Signed)
Patient is stating the pain meds that Dr.Campbell put her on recently is not helping the pain and is upsetting her stomach and she is asking for a different med please advise     also stated ultrasound on Wednesday 07/18/11 @ 1030am

## 2011-07-17 NOTE — Telephone Encounter (Signed)
Patient calling back checking on status of message

## 2011-07-18 NOTE — Telephone Encounter (Signed)
Called pt and left message below per Dr. Orvan Falconer.    (You may stop the medication and cont antiinflammatories such as motrin (800 mg every 8 hours) Or aleve (500 mg twice daily) alternating with tylenol (650 mg every 6 hours) as needed for pain. Keep your scheduled appointment after the ultrasound. Thank you)

## 2011-07-19 NOTE — Telephone Encounter (Signed)
Called all 3 numbers in chart. 2 were wrong numbers and the 3rd one is disconnected.  Will send letter.

## 2011-07-19 NOTE — Telephone Encounter (Signed)
Attempted to call pt regarding her ultrasound results.  No msg left. Pt needs to be scheduled for an endometrial biopsy. Will relay information to RN.

## 2011-07-19 NOTE — Progress Notes (Signed)
Quick Note:    Call: Your ultrasound has returned with a thickened lining of your uterus. You need to schedule an appointment asap for a biopsy. This is an in office procedure, which may cause some cramping and discomfort. Pending on the results you may need to be referred to a specialist. Thank you.  ______

## 2011-07-20 NOTE — Telephone Encounter (Signed)
Want to know results of her ultrasounds she had done on 07/18/11.... Please call.Marland KitchenMarland KitchenMarland Kitchen

## 2011-07-23 NOTE — Telephone Encounter (Signed)
Patient is calling to check the status of her message please advise

## 2011-07-24 NOTE — Telephone Encounter (Signed)
Notified of [place] results. Would like something called in for patient.Jody [name] take Ultracet, Advil bothers her stomach. Patient states that she is having pain and would like something-appointment scheduled for Monday for bx.

## 2011-07-25 NOTE — Telephone Encounter (Signed)
Pt called for update and stated was to have received a return call yesterday 4/23.  Please call pt today at son's cell [phone]

## 2011-07-30 LAB — AMB POC URINE PREGNANCY TEST, VISUAL COLOR COMPARISON: HCG urine, Ql. (POC): NEGATIVE

## 2011-07-30 MED ORDER — HYDROCODONE-ACETAMINOPHEN 2.5 MG-500 MG TAB
ORAL_TABLET | Freq: Four times a day (QID) | ORAL | Status: DC | PRN
Start: 2011-07-30 — End: 2012-01-11

## 2011-07-30 NOTE — Progress Notes (Addendum)
Subjective:    Jody [name] is a 41 y.o. female who presents for an endometrial biopsy.  The patient was evaluated for vaginal bleeding in November with a CT scan and some fibroids were seen.  Patient followed up with Dr. Orvan Falconer in the office two weeks ago.  At this point it was found that the patient had a thickened endometrial stripe and the patient was scheduled to be seen at our office for an endometrial biopsy.  The patient presents today stating that she's had intermittent pain as well as intermittent bleeding that's continued.  She denies shortness of breath, chest pain, nausea, vomiting, or any kind of breast pain.  The patient states that she has been able to urinate as well and does not have diarrhea or constipation.    Past Medical History   Diagnosis Date   ??? Diabetes    ??? Asthma    ??? Endometrial thickening on ultra sound      Past Surgical History   Procedure Date   ??? Hx orthopaedic      back   ??? Hx appendectomy    ??? Hx gyn      tubal     No family history on file.  History     Social History   ??? Marital Status: LEGALLY SEPARATED     Spouse Name: N/A     Number of Children: N/A   ??? Years of Education: N/A     Occupational History   ??? Not on file.     Social History Main Topics   ??? Smoking status: Former Smoker -- 10 years     Quit date: 07/11/2010   ??? Smokeless tobacco: Never Used   ??? Alcohol Use: No   ??? Drug Use: No   ??? Sexually Active: Yes -- Female partner(s)     Birth Control/ Protection: None     Other Topics Concern   ??? Not on file     Social History Narrative   ??? No narrative on file     Current Outpatient Prescriptions on File Prior to Visit   Medication Sig Dispense Refill   ??? medroxyPROGESTERone (PROVERA) 5 mg tablet One tab daily for the first 10-12 days of the month for menorrhagia  30 Tab  0   ??? metFORMIN (GLUCOPHAGE) 1,000 mg tablet Take 1 Tab by mouth two (2) times daily (with meals).  60 Tab  1   ??? glipiZIDE (GLUCOTROL) 5 mg tablet Take 1 Tab by mouth two (2) times a day.  60 Tab  1   ???  traMADol-acetaminophen (ULTRACET) 37.5-325 mg per tablet Take 2 Tabs by mouth every six (6) hours as needed for Pain.  60 Tab  0     Allergies   Allergen Reactions   ??? Erythromycin Nausea and Vomiting and Swelling   ??? Levaquin (Levofloxacin) Rash and Swelling   ??? Macrolide Antibiotics Rash, Nausea and Vomiting and Swelling   ??? Nsaids (Non-Steroidal Anti-Inflammatory Drug) Other (comments)     Per pt, was told by GI d/t stomach ulcers   ??? Pcn (Penicillins) Nausea and Vomiting and Swelling   ??? Ultram (Tramadol) Nausea Only       Oubjective:   Patient appears well.   BP 124/75   Pulse 81   Temp(Src) 97.9 ??F (36.6 ??C) (Oral)   Resp 16   Wt 247 lb (112.038 kg)   LMP 07/09/2011  Physical Examination: General appearance - alert, well appearing, and in no distress, oriented to  person, place, and time and overweight  Chest - clear to auscultation, no wheezes, rales or rhonchi, symmetric air entry  Heart - normal rate, regular rhythm, normal S1, S2, no murmurs, rubs, clicks or gallops  Abdomen - soft, nontender, nondistended, no masses or organomegaly  Pelvic - VULVA: normal appearing vulva with no masses, tenderness or lesions, VAGINA: normal appearing vagina with normal color and discharge, no lesions, CERVIX: normal appearing cervix without discharge or lesions but some bleeding  Neurological - alert, oriented, normal speech, no focal findings or movement disorder noted  Musculoskeletal - no joint tenderness, deformity or swelling  Extremities - peripheral pulses normal, no pedal edema, no clubbing or cyanosis  Skin - normal coloration and turgor, no rashes, no suspicious skin lesions noted    Assessment:   41 year old female with history of vaginal bleeding as well as a thickened endometrial stripe visualized on Ultrasound who presents for an diagnostic endometrial biopsy.    Procedure Note:   Endometrial Biopsy Procedure Note    Endometrial biopsy was performed today.    Indications: abnormal uterine bleeding     Procedure Details   Urine pregnancy test was not done.  The risks (including infection, bleeding, pain, and uterine perforation) and benefits of the procedure were explained to the her and written informed consent was obtained. Antibiotic prophylaxis against endocarditis was not indicated.    She was placed in the dorsal lithotomy position.  Bimanual exam showed the uterus to be in the retroflexed position.  A speculum inserted in the vagina, and the cervix prepped with povidone iodine. A "Pipelle endometrial aspirator" was used to sample the endometrium. Sample was sent for pathologic examination.  Samples were done x2 and done down to about 7 cm's.    The patient tolerated the procedure well and there were no complications.    Plan:  Ms. Wholey was advised to call for any fever or for prolonged or severe pain or bleeding. She was advised to use Lortab as needed for mild to moderate pain, patient is allergic to all NSAIDS. She was advised to avoid vaginal intercourse for 48 hours or until the bleeding has completely stopped. My office will get in touch with her as soon as we have the pathology report.    Procedure was supervised directly by Dr. Orvan Falconer.

## 2011-07-30 NOTE — Patient Instructions (Signed)
Endometrial Biopsy: After Your Visit  Your Care Instructions  An endometrial biopsy is a test that helps your doctor look for problems in the lining of your uterus (endometrium). If you have abnormal bleeding from your uterus, you may have this test to see if you have abnormal cells in the lining of your uterus. Some women have this test if they have problems getting pregnant. The test results show how your body's hormones are affecting the lining of the uterus.  During an endometrial biopsy, you will lie on an exam table with your feet in stirrups. The doctor may use a spray or injection to numb your cervix, which is the opening to the uterus.  The doctor will use a speculum to see the cervix. Then the doctor will pass a thin tube through the cervix to take a sample of the endometrium. You may feel a sharp cramp when the doctor collects the sample. You likely will have mild vaginal bleeding and may have cramps for a few days after the biopsy. Most women find that the cramps feel like bad menstrual cramps.  The sample is sent to a lab where it is looked at under a microscope. Usually the doctor will call you in a week or two with the results.  Follow-up care is a key part of your treatment and safety. Be sure to make and go to all appointments, and call your doctor if you are having problems. It???s also a good idea to know your test results and keep a list of the medicines you take.  What should you do to get ready for an endometrial biopsy?  ?? Do not wash the inside of your vagina (douche) or use tampons, or use vaginal cream or medicine for at least 24 hours before this test.  ?? Be sure to tell your doctor if you are or might be pregnant or have been treated recently for an infection in your pelvic area.  ?? Your doctor may recommend that you take anti-inflammatory medicine, such as ibuprofen (Advil, Motrin), 30 to 60 minutes before the biopsy. This will lessen the pain when tissue is removed during the biopsy.   How can you care for yourself at home?  ?? Do not have sex, use tampons, or douche until the spotting stops. Use sanitary pads for vaginal bleeding or discharge.  ?? If your doctor has not prescribed pain medicine, take anti-inflammatory medicines to reduce cramps and pain after the biopsy. These include ibuprofen (Advil, Motrin) and naproxen (Aleve). Read and follow all instructions on the label.  When should you call for help?  Call your doctor now or seek immediate medical care if:  ?? You have severe vaginal bleeding. You are passing clots of blood and soaking through your usual pads every hour for 2 or more hours.  ?? You have signs of a pelvic infection, such as:  ?? A fever.  ?? Chills.  ?? Moderate to severe pain in the pelvic area.  ?? Unusual vaginal discharge.  Watch closely for changes in your health, and be sure to contact your doctor if:  ?? You have vaginal bleeding or discharge that lasts longer than a week after the biopsy.   Where can you learn more?   Go to http://www.healthwise.net/[facility]  Enter V957 in the search box to learn more about "Endometrial Biopsy: After Your Visit."   ?? 2006-2013 Healthwise, Incorporated. Care instructions adapted under license by [employer] (which disclaims liability or warranty for this information). This care   instruction is for use with your licensed healthcare professional. If you have questions about a medical condition or this instruction, always ask your healthcare professional. Healthwise, Incorporated disclaims any warranty or liability for your use of this information.  Content Version: 9.6.101520; Last Revised: December 02, 2009

## 2011-07-31 LAB — CBC WITH AUTOMATED DIFF
ABS. BASOPHILS: 0 10*3/uL (ref 0.0–0.2)
ABS. EOSINOPHILS: 0.4 10*3/uL (ref 0.0–0.4)
ABS. IMM. GRANS.: 0 10*3/uL (ref 0.0–0.1)
ABS. MONOCYTES: 0.5 10*3/uL (ref 0.1–1.0)
ABS. NEUTROPHILS: 6.9 10*3/uL (ref 1.8–7.8)
Abs Lymphocytes: 1.9 10*3/uL (ref 0.7–4.5)
BASOPHILS: 0 % (ref 0–3)
EOSINOPHILS: 5 % (ref 0–7)
HCT: 35.2 % (ref 34.0–46.6)
HGB: 10.9 g/dL — ABNORMAL LOW (ref 11.1–15.9)
IMMATURE GRANULOCYTES: 0 % (ref 0–2)
Lymphocytes: 20 % (ref 14–46)
MCH: 23.7 pg — ABNORMAL LOW (ref 26.6–33.0)
MCHC: 31 g/dL — ABNORMAL LOW (ref 31.5–35.7)
MCV: 77 fL — ABNORMAL LOW (ref 79–97)
MONOCYTES: 5 % (ref 4–13)
NEUTROPHILS: 70 % (ref 40–74)
PLATELET: 391 10*3/uL (ref 140–415)
RBC: 4.59 x10E6/uL (ref 3.77–5.28)
RDW: 14.3 % (ref 12.3–15.4)
WBC: 9.7 10*3/uL (ref 4.0–10.5)

## 2011-07-31 NOTE — Telephone Encounter (Signed)
Pt seen for her appointment on 4/29 s/p EMBx. Medication adjusted at that time. See note

## 2011-07-31 NOTE — Telephone Encounter (Signed)
Patient complaining of heavy bleeding ( clots ), advised patient to go to Emergency Room, patient declined.    Patient stated she was told after her biopsy she would  have heavy bleeding. Does she suppose to be passing blood clots?     Appointment with Dr. Forrestine Him [phone].

## 2011-08-02 NOTE — Telephone Encounter (Signed)
Pt called requesting to get an update from her labs done 07/30/11.              Thanks,

## 2011-08-03 NOTE — Telephone Encounter (Signed)
2nd request regarding biopsy results.       (The physician is out of the office.  Can this be addressed by another physician for the patient?)

## 2011-08-06 NOTE — Telephone Encounter (Signed)
Left message to call office,    Please call and inform the patient that her biopsy results were normal. She needs to schedule an appointment for her medication evaluation, the provera. Thank you per Dr. Orvan Falconer.

## 2011-08-08 NOTE — Telephone Encounter (Signed)
Patient appointment 08/10/2011 with Dr. Forrestine Him.

## 2011-08-10 LAB — AMB POC URINALYSIS DIP STICK AUTO W/O MICRO
Bilirubin (UA POC): NEGATIVE
Glucose (UA POC): NEGATIVE
Ketones (UA POC): NEGATIVE
Nitrites (UA POC): NEGATIVE
Specific gravity (UA POC): 1.03 (ref 1.001–1.035)
Urobilinogen (UA POC): 1 (ref 0.2–1)
pH (UA POC): 8 (ref 4.6–8.0)

## 2011-08-10 NOTE — Progress Notes (Signed)
History and Physical  Subjective:     Jody [name] is a 41 y.o. Caucasian female who presents with a follow up from her visit to [facility] emergency room on 08/02/11 for cramping and some bleeding.  She also saw a couple of weeks ago for an endometrial biopsy.  The biopsy did not result for anything significant, showing benign inactive endometrial glands and stroma, endocervical glands, blood, and blood elements.  The patient tells [place] that she's having bleeding on and off, alongside some clot immediately after the biopsy, although the clots have subsided.  The patient also states that she's having cramping on and off in the right lower and left lower quadrants.  She is taking percocet for the pain that she was given at [facility], taking about two pills a day.  She also is taking ciprofloxacin twice a day for her UTI.  The patient states that she never had urinary symptoms anyway.  She took her provera for 10 days starting on the 20th of April, stopped it since for 10 days and will restart it for ten days again today.     Past Medical History   Diagnosis Date   ??? Diabetes    ??? Asthma    ??? Endometrial thickening on ultra sound       Past Surgical History   Procedure Date   ??? Hx orthopaedic      back   ??? Hx appendectomy    ??? Hx gyn      tubal     History reviewed. No pertinent family history.   History   Substance Use Topics   ??? Smoking status: Former Smoker -- 10 years     Quit date: 07/11/2010   ??? Smokeless tobacco: Never Used   ??? Alcohol Use: No       Prior to Admission medications    Medication Sig Start Date End Date Taking? Authorizing Provider   oxyCODONE-acetaminophen (PERCOCET) 7.5-325 mg per tablet Take 1 Tab by mouth two (2) times a day.   Yes Historical Provider   ciprofloxacin (CIPRO) 500 mg tablet Take 500 mg by mouth two (2) times a day.   Yes Historical Provider   medroxyPROGESTERone (PROVERA) 5 mg tablet One tab daily for the first 10-12 days of the month for menorrhagia 07/11/11  Yes Maryelizabeth Kaufmann, MD    metFORMIN (GLUCOPHAGE) 1,000 mg tablet Take 1 Tab by mouth two (2) times daily (with meals). 07/10/11  Yes Kandis Mannan, MD   glipiZIDE (GLUCOTROL) 5 mg tablet Take 1 Tab by mouth two (2) times a day. 07/10/11  Yes Kandis Mannan, MD   HYDROcodone-acetaminophen (LORTAB) 2.5-500 mg per tablet Take 1 Tab by mouth every six (6) hours as needed for Pain. 07/30/11   Sandria Senter, MD   traMADol-acetaminophen (ULTRACET) 37.5-325 mg per tablet Take 2 Tabs by mouth every six (6) hours as needed for Pain. 07/11/11   Maryelizabeth Kaufmann, MD     Allergies   Allergen Reactions   ??? Erythromycin Nausea and Vomiting and Swelling   ??? Levaquin (Levofloxacin) Rash and Swelling   ??? Macrolide Antibiotics Rash, Nausea and Vomiting and Swelling   ??? Nsaids (Non-Steroidal Anti-Inflammatory Drug) Other (comments)     Per pt, was told by GI d/t stomach ulcers   ??? Pcn (Penicillins) Nausea and Vomiting and Swelling   ??? Ultram (Tramadol) Nausea Only        Review of Systems:  Respiratory: negative for cough, sputum or hemoptysis  Cardiovascular: negative  for palpitations, irregular heart beats, near-syncope  Gastrointestinal: negative for reflux symptoms, nausea and vomiting  Genitourinary:negative for nocturia, urinary incontinence and hesitancy     Objective:   Physical Exam:   BP 135/82   Pulse 82   Temp(Src) 97.5 ??F (36.4 ??C) (Oral)   Resp 12   Ht 5\' 5"  (1.651 m)   Wt 245 lb 12.8 oz (111.494 kg)   BMI 40.90 kg/m2   LMP 02/10/2011  General appearance: alert, cooperative, no distress, appears stated age  Lungs: clear to auscultation bilaterally  Heart: regular rate and rhythm, S1, S2 normal, no murmur, click, rub or gallop  Abdomen: soft, non-tender. Bowel sounds normal. No masses,  no organomegaly  Extremities: extremities normal, atraumatic, no cyanosis or edema  Pulses: 2+ and symmetric  Skin: Skin color, texture, turgor normal. No rashes or lesions  Assessment:   41 year old female with a hx of a endometrial biopsy secondary to dysfunctional  uterine bleeding and recent UTI who continues to have vaginal bleeding and cramping.    Plan:     Orders Placed This Encounter   ??? AMB POC URINALYSIS DIP STICK AUTO W/O MICRO   ??? oxyCODONE-acetaminophen (PERCOCET) 7.5-325 mg per tablet     Sig: Take 1 Tab by mouth two (2) times a day.   ??? ciprofloxacin (CIPRO) 500 mg tablet     Sig: Take 500 mg by mouth two (2) times a day.   We have ran a urine on the patient today.  We will follow this urine and get a culture if it still shows infection.  We will also tell the patient to follow up in 10 days with Dr. Robby Sermon to be evaluated for continued DUB.  The patient is to return prn if she develops fevers, sob, clots bleeding again.      Signed By: Sandria Senter, MD     Aug 10, 2011

## 2011-08-10 NOTE — Progress Notes (Signed)
Patient was seen at [facility] ER on 08/02/11 for pain in her right lower abdomen.

## 2011-08-10 NOTE — Patient Instructions (Signed)
Dysfunctional Uterine Bleeding: After Your Visit  Your Care Instructions  Dysfunctional uterine bleeding is vaginal bleeding that is not related to your normal menstrual (monthly) cycle. Sometimes you may have heavy bleeding and at other times mild spotting. The bleeding is often related to high or low hormone levels. Sometimes a cause cannot be found.  You may have heavy bleeding when you are not expecting your period. In most cases, the cause is not serious. But in rare cases a more serious problem, such as an infection, unknown pregnancy, or cancer, can cause the bleeding. A pregnancy test may have been done. If not, let your doctor know if you might be pregnant.  Follow-up care is a key part of your treatment and safety. Be sure to make and go to all appointments, and call your doctor if you are having problems. It???s also a good idea to know your test results and keep a list of the medicines you take.  How can you care for yourself at home?  ?? Take pain medicines exactly as directed.  ?? If the doctor gave you a prescription medicine for pain, take it as prescribed.  ?? If you are not taking a prescription pain medicine, ask your doctor if you can take an over-the-counter medicine.  ?? You may be low in iron because of blood loss. Eat a balanced diet that is high in iron and vitamin C. Foods rich in iron include red meat, shellfish, eggs, beans, and leafy green vegetables. Talk to your doctor about whether you need to take iron pills or a multivitamin.  When should you call for help?  Call 911 anytime you think you may need emergency care. For example, call if:  ?? You passed out (lost consciousness).  Call your doctor now or seek immediate medical care if:  ?? You have sudden, severe pain in your belly or pelvis.  ?? You have severe vaginal bleeding. You are passing clots of blood and soaking through your usual pads or tampons every hour for 2 or more hours.  ?? You feel dizzy or lightheaded, or you feel like you  may faint.  Watch closely for changes in your health, and be sure to contact your doctor if:  ?? You have new belly or pelvic pain.  ?? You have a fever.  ?? Your bleeding gets worse or lasts longer than 1 week.  ?? You think you may be pregnant.   Where can you learn more?   Go to http://www.healthwise.net/[facility]  Enter I327 in the search box to learn more about "Dysfunctional Uterine Bleeding: After Your Visit."   ?? 2006-2013 Healthwise, Incorporated. Care instructions adapted under license by [employer] (which disclaims liability or warranty for this information). This care instruction is for use with your licensed healthcare professional. If you have questions about a medical condition or this instruction, always ask your healthcare professional. Healthwise, Incorporated disclaims any warranty or liability for your use of this information.  Content Version: 9.6.101520; Last Revised: June 23, 2009

## 2011-08-29 ENCOUNTER — Encounter

## 2011-08-29 MED ORDER — GLIPIZIDE 5 MG TAB
5 mg | ORAL_TABLET | Freq: Two times a day (BID) | ORAL | Status: DC
Start: 2011-08-29 — End: 2011-10-14

## 2011-08-29 MED ORDER — METFORMIN 1,000 MG TAB
1000 mg | ORAL_TABLET | Freq: Two times a day (BID) | ORAL | Status: DC
Start: 2011-08-29 — End: 2011-12-07

## 2011-09-20 NOTE — Addendum Note (Signed)
Addended by: Lennox Grumbles on: 09/20/2011 02:14 PM     Modules accepted: Level of Service

## 2011-10-01 NOTE — Addendum Note (Signed)
Addended by: Lennox Grumbles on: 10/01/2011 10:21 AM     Modules accepted: Level of Service

## 2011-10-14 ENCOUNTER — Encounter

## 2011-10-15 MED ORDER — GLIPIZIDE 5 MG TAB
5 mg | ORAL_TABLET | Freq: Two times a day (BID) | ORAL | Status: DC
Start: 2011-10-15 — End: 2011-12-07

## 2011-11-30 LAB — AMB POC HEMOGLOBIN A1C: Hemoglobin A1c (POC): 5.9 %

## 2011-11-30 LAB — AMB POC HEMOGLOBIN (HGB): Hemoglobin (POC): 8.9

## 2011-11-30 NOTE — Patient Instructions (Addendum)
Fainting: After Your Visit  Your Care Instructions  When you faint, or pass out, you lose consciousness for a short time. A brief drop in blood flow to the brain often causes it. When you fall or lie down, more blood flows to your brain and you regain consciousness.  Emotional stress, pain, or overheating???especially if you have been standing???can make you faint. In these cases, fainting is usually not serious. But fainting can be a sign of a more serious problem. Your doctor may want you to have more tests to rule out other causes.  The treatment you need depends on the reason why you fainted.  The doctor has checked you carefully, but problems can develop later. If you notice any problems or new symptoms, get medical treatment right away.  Follow-up care is a key part of your treatment and safety. Be sure to make and go to all appointments, and call your doctor if you are having problems. It's also a good idea to know your test results and keep a list of the medicines you take.  How can you care for yourself at home?  ?? Drink plenty of fluids to prevent dehydration. If you have kidney, heart, or liver disease and have to limit fluids, talk with your doctor before you increase your fluid intake.  When should you call for help?  Call 911 anytime you think you may need emergency care. For example, call if:  ?? You have symptoms of a heart problem. These may include:  ?? Chest pain or pressure.  ?? Severe trouble breathing.  ?? A fast or irregular heartbeat.  ?? Lightheadedness or sudden weakness.  ?? Coughing up pink, foamy mucus.  ?? Passing out.  After you call 911, the operator may tell you to chew 1 adult-strength or 2 to 4 low-dose aspirin. Wait for an ambulance. Do not try to drive yourself.  ?? You have symptoms of a stroke. These may include:  ?? Sudden numbness, tingling, weakness, or loss of movement in your face, arm, or leg, especially on only one side of your body.  ?? Sudden vision changes.  ?? Sudden trouble  speaking.  ?? Sudden confusion or trouble understanding simple statements.  ?? Sudden problems with walking or balance.  ?? A sudden, severe headache that is different from past headaches.  ?? You passed out (lost consciousness) again.  Watch closely for changes in your health, and be sure to contact your doctor if:  ?? You do not get better as expected.   Where can you learn more?   Go to [email or web address]  Enter A848 in the search box to learn more about "Fainting: After Your Visit."   ?? 2006-2013 [employer]. Care instructions adapted under license by [employer] (which disclaims liability or warranty for this information). This care instruction is for use with your licensed healthcare professional. If you have questions about a medical condition or this instruction, always ask your healthcare professional. [employer], Inc[employer] disclaims any warranty or liability for your use of this information.  Content Version: 9.7.130178; Last Revised: September 27, 2010            Anemia: After Your Visit  Your Care Instructions  Anemia is a low level of red blood cells, which carry oxygen throughout your body. Many things can cause anemia. Lack of iron is one of the most common causes. Your body needs iron to make hemoglobin, a substance in red blood cells that carries oxygen from the lungs to your  body's cells. Without enough iron, the body produces fewer and smaller red blood cells. As a result, your body???s cells do not get enough oxygen, and you feel tired and weak. And you may have trouble concentrating.  Bleeding is the most common cause of a lack of iron. You may have heavy menstrual bleeding or bleeding caused by conditions such as ulcers, hemorrhoids, or cancer. Regular use of aspirin or other anti-inflammatory medicines (such as ibuprofen) also can cause bleeding in some people. A lack of iron in your diet also can cause anemia, especially at times when the body needs more iron, such as  during pregnancy, infancy, and the teen years.  Your doctor may have prescribed iron pills. It may take several months of treatment for your iron levels to return to normal. Your doctor also may suggest that you eat foods that are rich in iron, such as meat and beans.  There are many other causes of anemia. It is not always due to a lack of iron. Finding the specific cause of your anemia will help your doctor find the right treatment for you.  Follow-up care is a key part of your treatment and safety. Be sure to make and go to all appointments, and call your doctor if you are having problems. It???s also a good idea to know your test results and keep a list of the medicines you take.  How can you care for yourself at home?  ?? Take your medicines exactly as prescribed. Call your doctor if you think you are having a problem with your medicine.  ?? If your doctor recommends iron pills, take them as directed:  ?? Try to take the pills on an empty stomach about 1 hour before or 2 hours after meals. But you may need to take iron with food to avoid an upset stomach.  ?? Do not take antacids or drink milk or caffeine drinks (such as coffee, tea, or cola) at the same time or within 2 hours of the time that you take your iron. They can make it hard for your body to absorb the iron.  ?? Vitamin C (from food or supplements) helps your body absorb iron. Try taking iron pills with a glass of orange juice or some other food that is high in vitamin C, such as citrus fruits.  ?? Iron pills may cause stomach problems, such as heartburn, nausea, diarrhea, constipation, and cramps. Be sure to drink plenty of fluids, and include fruits, vegetables, and fiber in your diet each day. Iron pills often make your bowel movements dark or green.  ?? If you forget to take an iron pill, do not take a double dose of iron the next time you take a pill.  ?? Keep iron pills out of the reach of small children. An overdose of iron can be very dangerous.  ??  Follow your doctor's advice about eating iron-rich foods. These include red meat, shellfish, poultry, eggs, beans, raisins, whole-grain bread, and leafy green vegetables.  ?? Steam vegetables to help them keep their iron content.  When should you call for help?  Call 911 anytime you think you may need emergency care. For example, call if:  ?? You have symptoms of a heart attack. These may include:  ?? Chest pain or pressure, or a strange feeling in the chest.  ?? Sweating.  ?? Shortness of breath.  ?? Nausea or vomiting.  ?? Pain, pressure, or a strange feeling in the back, neck, jaw, or  upper belly or in one or both shoulders or arms.  ?? Lightheadedness or sudden weakness.  ?? A fast or irregular heartbeat.  After you call 911, the operator may tell you to chew 1 adult-strength or 2 to 4 low-dose aspirin. Wait for an ambulance. Do not try to drive yourself.  ?? You passed out (lost consciousness).  ?? You vomit blood or what looks like coffee grounds.  ?? You pass maroon or very bloody stools.  Call your doctor now or seek immediate medical care if:  ?? You have new or increased shortness of breath.  ?? You are dizzy or lightheaded, or you feel like you may faint.  ?? You have any unusual bleeding, such as:  ?? Blood spots under the skin.  ?? A nosebleed that you cannot stop.  ?? Bleeding gums when you brush your teeth.  ?? Blood in your urine.  ?? Vaginal bleeding when you are not having your period, or heavy period bleeding.  ?? Your stools are black and tarlike or have streaks of blood.  ?? Your fatigue and weakness continue or get worse.  Watch closely for changes in your health, and be sure to contact your doctor if:  ?? You do not get better as expected.   Where can you learn more?   Go to [email or web address]  Enter R301 in the search box to learn more about "Anemia: After Your Visit."   ?? 2006-2013 [employer], [employer]. Care instructions adapted under license by [employer] (which disclaims liability or  warranty for this information). This care instruction is for use with your licensed healthcare professional. If you have questions about a medical condition or this instruction, always ask your healthcare professional. [employer], [employer] disclaims any warranty or liability for your use of this information.  Content Version: 9.7.130178; Last Revised: September 12, 2009

## 2011-11-30 NOTE — Progress Notes (Signed)
I discussed the findings, assessment and plan with the resident and agree with the resident's findings and plan as documented in the resident's note.

## 2011-11-30 NOTE — Progress Notes (Signed)
Jody [name] is a 40 y.o. female who presents for hospital follow-up of syncope.  Patient had 3 syncopal episodes within approximately one hour.  She denies pre-syncopal symptoms.  She only states that she was walking down her hallway from her bedroom when the episodes occurred.  She denies prior syncopal episodes.  She was seen at [facility].  She states her work-up included CT scans and EKG.  She reports all were negative.  She has a family history of seizure disorder.  She denies having witnesses to the syncopal events.  She denies loss of bowel and bladder control.  She denies being post ictal.    PMHx:  Past Medical History   Diagnosis Date   ??? Diabetes    ??? Asthma    ??? Endometrial thickening on ultra sound        Meds:   Current Outpatient Prescriptions   Medication Sig Dispense Refill   ??? glipiZIDE (GLUCOTROL) 5 mg tablet Take 1 Tab by mouth two (2) times a day. Please have the patient come in for an office visit for next refills  60 Tab  0   ??? metFORMIN (GLUCOPHAGE) 1,000 mg tablet Take 1 Tab by mouth two (2) times daily (with meals).  60 Tab  1   ??? oxyCODONE-acetaminophen (PERCOCET) 7.5-325 mg per tablet Take 1 Tab by mouth two (2) times a day.       ??? ciprofloxacin (CIPRO) 500 mg tablet Take 500 mg by mouth two (2) times a day.       ??? HYDROcodone-acetaminophen (LORTAB) 2.5-500 mg per tablet Take 1 Tab by mouth every six (6) hours as needed for Pain.  40 Tab  0   ??? traMADol-acetaminophen (ULTRACET) 37.5-325 mg per tablet Take 2 Tabs by mouth every six (6) hours as needed for Pain.  60 Tab  0   ??? medroxyPROGESTERone (PROVERA) 5 mg tablet One tab daily for the first 10-12 days of the month for menorrhagia  30 Tab  0       Allergies:   Allergies   Allergen Reactions   ??? Erythromycin Nausea and Vomiting and Swelling   ??? Levaquin (Levofloxacin) Rash and Swelling   ??? Macrolide Antibiotics Rash, Nausea and Vomiting and Swelling   ??? Nsaids (Non-Steroidal Anti-Inflammatory Drug) Other (comments)      Per pt, was told by GI d/t stomach ulcers   ??? Pcn (Penicillins) Nausea and Vomiting and Swelling   ??? Ultram (Tramadol) Nausea Only       Smoker:  History   Smoking status   ??? Former Smoker -- 10 years   ??? Quit date: 07/11/2010   Smokeless tobacco   ??? Never Used       ETOH:   History   Alcohol Use No       FH: No family history on file.    ROS:  General/Constitutional:   No headache, fever, fatigue, weight loss or weight gain       Eyes:   No redness, pruritis, pain, visual changes, swelling, or discharge      Ears:    No pain, loss or changes in hearing     Neck:   No swelling, masses, stiffness, pain, or limited movement     Cardiac:    No chest pain      Respiratory:   No cough or shortness of breath     GI:   No nausea/vomiting, diarrhea, abdominal pain, bloody or dark stools  GU:   No dysuria or  hematuria       Skin: No rash     Physical Exam:  BP 127/73   Pulse 63   Temp 97.7 ??F (36.5 ??C) (Oral)   Resp 61   Ht 5\' 5"  (1.651 m)   Wt 244 lb 6.4 oz (110.859 kg)   BMI 40.67 kg/m2   SpO2 100%  Gen: AAOx3. NAD  HEENT: NCAT. PERRLA. TMs clear.  External auditory canal is non-erythematous.  Oropharynx normal.  Neck: Trachea midline.  No thryomegaly.  No carotid bruitis  CV: S1S2 RRR. No rubs, murmurs, or gallops  Lungs: CTA bilaterally. No wheezes, rales, or rhonchi  Abdomen: soft, NT, ND, +BS  Neuro: CN II-XII grossly intact. No focal deficits. Normal sensation.  Normal strength.  Neg Romberg.     Assessment:  1. Syncope  AMB POC HEMOGLOBIN A1C, AMB POC HEMOGLOBIN (HGB), CBC WITH AUTOMATED DIFF   2. Anemia  CBC WITH AUTOMATED DIFF, VITAMIN B12 & FOLATE, FERRITIN, IRON PROFILE       Plan:  Syncope- exact cause is unknown.  Likely multifactorial with anemia and dm medications playing a role.  Will check anemia studies  Will hold off pursuing seizure work-up  Get records from [facility]

## 2011-11-30 NOTE — Progress Notes (Signed)
Chief Complaint   Patient presents with   ??? Hospital Follow Up     seen atr [facility] on 11/26/2011  s/p fall after fainting      Pt states she had CT scan and xrays

## 2011-12-01 LAB — CBC WITH AUTOMATED DIFF
ABS. BASOPHILS: 0 10*3/uL (ref 0.0–0.2)
ABS. EOSINOPHILS: 0.5 10*3/uL — ABNORMAL HIGH (ref 0.0–0.4)
ABS. IMM. GRANS.: 0 10*3/uL (ref 0.0–0.1)
ABS. MONOCYTES: 0.3 10*3/uL (ref 0.1–1.0)
ABS. NEUTROPHILS: 4.1 10*3/uL (ref 1.8–7.8)
Abs Lymphocytes: 1.7 10*3/uL (ref 0.7–4.5)
BASOPHILS: 0 % (ref 0–3)
EOSINOPHILS: 8 % — ABNORMAL HIGH (ref 0–7)
HCT: 29.4 % — ABNORMAL LOW (ref 34.0–46.6)
HGB: 8.6 g/dL — ABNORMAL LOW (ref 11.1–15.9)
IMMATURE GRANULOCYTES: 0 % (ref 0–2)
Lymphocytes: 26 % (ref 14–46)
MCH: 20.6 pg — ABNORMAL LOW (ref 26.6–33.0)
MCHC: 29.3 g/dL — ABNORMAL LOW (ref 31.5–35.7)
MCV: 71 fL — ABNORMAL LOW (ref 79–97)
MONOCYTES: 4 % (ref 4–13)
NEUTROPHILS: 62 % (ref 40–74)
PLATELET: 366 10*3/uL (ref 140–415)
RBC: 4.17 x10E6/uL (ref 3.77–5.28)
RDW: 18 % — ABNORMAL HIGH (ref 12.3–15.4)
WBC: 6.5 10*3/uL (ref 4.0–10.5)

## 2011-12-01 LAB — IRON PROFILE
Iron % saturation: 5 % — CL (ref 15–55)
Iron: 16 ug/dL — ABNORMAL LOW (ref 35–155)
TIBC: 327 ug/dL (ref 250–450)
UIBC: 311 ug/dL (ref 150–375)

## 2011-12-01 LAB — FERRITIN: Ferritin: 6 ng/mL — ABNORMAL LOW (ref 15–150)

## 2011-12-01 LAB — VITAMIN B12 & FOLATE
Folate: 7.6 ng/mL (ref >3.0)
Vitamin B12: 243 pg/mL (ref 211–946)

## 2011-12-03 LAB — GLUCOSE, POC: Glucose (POC): 149 mg/dL — ABNORMAL HIGH (ref 65–105)

## 2011-12-03 LAB — CBC WITH AUTOMATED DIFF
ABS. BASOPHILS: 0 10*3/uL (ref 0.0–0.1)
ABS. EOSINOPHILS: 0.4 10*3/uL (ref 0.0–0.4)
ABS. LYMPHOCYTES: 2.6 10*3/uL (ref 0.8–3.5)
ABS. MONOCYTES: 0.4 10*3/uL (ref 0.0–1.0)
ABS. NEUTROPHILS: 4.1 10*3/uL (ref 1.8–8.0)
BASOPHILS: 0 % (ref 0–1)
EOSINOPHILS: 5 % (ref 0–7)
HCT: 29.7 % — ABNORMAL LOW (ref 35.0–47.0)
HGB: 8.8 g/dL — ABNORMAL LOW (ref 11.5–16.0)
LYMPHOCYTES: 34 % (ref 12–49)
MCH: 20.9 PG — ABNORMAL LOW (ref 26.0–34.0)
MCHC: 29.6 g/dL — ABNORMAL LOW (ref 30.0–36.5)
MCV: 70.4 FL — ABNORMAL LOW (ref 80.0–99.0)
MONOCYTES: 6 % (ref 5–13)
NEUTROPHILS: 55 % (ref 32–75)
PLATELET: 403 10*3/uL — ABNORMAL HIGH (ref 150–400)
RBC: 4.22 M/uL (ref 3.80–5.20)
RDW: 18.1 % — ABNORMAL HIGH (ref 11.5–14.5)
WBC: 7.7 10*3/uL (ref 3.6–11.0)

## 2011-12-03 LAB — URINALYSIS W/ REFLEX CULTURE
Blood: NEGATIVE
Glucose: NEGATIVE MG/DL
Ketone: NEGATIVE MG/DL
Leukocyte Esterase: NEGATIVE
Nitrites: NEGATIVE
Specific gravity: 1.028 (ref 1.003–1.030)
Urobilinogen: 0.2 EU/DL (ref 0.2–1.0)
pH (UA): 5.5 (ref 5.0–8.0)

## 2011-12-03 LAB — METABOLIC PANEL, COMPREHENSIVE
A-G Ratio: 0.9 — ABNORMAL LOW (ref 1.1–2.2)
ALT (SGPT): 20 U/L (ref 12–78)
AST (SGOT): 21 U/L (ref 15–37)
Albumin: 3.7 g/dL (ref 3.5–5.0)
Alk. phosphatase: 81 U/L (ref 50–136)
Anion gap: 7 mmol/L (ref 5–15)
BUN/Creatinine ratio: 16 (ref 12–20)
BUN: 12 MG/DL (ref 6–20)
Bilirubin, total: 0.4 MG/DL (ref 0.2–1.0)
CO2: 29 MMOL/L (ref 21–32)
Calcium: 8.5 MG/DL (ref 8.5–10.1)
Chloride: 103 MMOL/L (ref 97–108)
Creatinine: 0.74 MG/DL (ref 0.45–1.15)
GFR est AA: >60 mL/min/{1.73_m2} (ref >60)
GFR est non-AA: >60 mL/min/{1.73_m2} (ref >60)
Globulin: 3.9 g/dL (ref 2.0–4.0)
Glucose: 131 MG/DL — ABNORMAL HIGH (ref 65–100)
Potassium: 3.5 MMOL/L (ref 3.5–5.1)
Protein, total: 7.6 g/dL (ref 6.4–8.2)
Sodium: 139 MMOL/L (ref 136–145)

## 2011-12-03 LAB — EKG, 12 LEAD, INITIAL
Atrial Rate: 76 {beats}/min
Calculated P Axis: 47 degrees
Calculated R Axis: 64 degrees
Calculated T Axis: 59 degrees
Diagnosis: NORMAL
P-R Interval: 152 ms
Q-T Interval: 424 ms
QRS Duration: 90 ms
QTC Calculation (Bezet): 477 ms
Ventricular Rate: 76 {beats}/min

## 2011-12-03 LAB — DRUG SCREEN, URINE
AMPHETAMINES: NEGATIVE
BARBITURATES: NEGATIVE
BENZODIAZEPINES: NEGATIVE
COCAINE: NEGATIVE
METHADONE: NEGATIVE
OPIATES: NEGATIVE
PCP(PHENCYCLIDINE): NEGATIVE
THC (TH-CANNABINOL): NEGATIVE

## 2011-12-03 LAB — HCG URINE, QL. - POC: Pregnancy test,urine (POC): NEGATIVE

## 2011-12-03 LAB — CK W/ REFLX CKMB: CK: 71 U/L (ref 26–192)

## 2011-12-03 LAB — TROPONIN I: Troponin-I, Qt.: <0.04 ng/mL (ref <0.05)

## 2011-12-03 MED ADMIN — ondansetron (ZOFRAN) injection 4 mg: INTRAVENOUS | @ 05:00:00 | NDC 00781301072

## 2011-12-03 MED ADMIN — diazepam (VALIUM) tablet 5 mg: ORAL | @ 07:00:00 | NDC 68084035911

## 2011-12-03 MED ADMIN — morphine injection 2 mg: INTRAVENOUS | @ 07:00:00 | NDC 00409189001

## 2011-12-03 MED ADMIN — 0.9% sodium chloride infusion: INTRAVENOUS | @ 05:00:00 | NDC 00409798309

## 2011-12-03 MED ADMIN — morphine injection 2 mg: INTRAVENOUS | @ 05:00:00 | NDC 00409189001

## 2011-12-03 MED ADMIN — sodium chloride (NS) flush 10 mL: INTRAVENOUS | @ 05:00:00 | NDC 87701099893

## 2011-12-03 MED FILL — SODIUM CHLORIDE 0.9 % IV: INTRAVENOUS | Qty: 1000

## 2011-12-03 MED FILL — SALINE FLUSH INJECTION SYRINGE: INTRAMUSCULAR | Qty: 10

## 2011-12-03 MED FILL — MORPHINE 2 MG/ML INJECTION: 2 mg/mL | INTRAMUSCULAR | Qty: 1

## 2011-12-03 MED FILL — ONDANSETRON (PF) 4 MG/2 ML INJECTION: 4 mg/2 mL | INTRAMUSCULAR | Qty: 2

## 2011-12-03 MED FILL — DIAZEPAM 5 MG TAB: 5 mg | ORAL | Qty: 1

## 2011-12-03 NOTE — ED Notes (Signed)
Pt stable, no s/s of distress noted at this time, pt lying in bed on the phone, family member at bedside, will continue to monitor

## 2011-12-03 NOTE — ED Notes (Signed)
Pt has multiple medical complaints pt states she has passed out 3 times today h/a chills nausea anemia

## 2011-12-03 NOTE — ED Provider Notes (Signed)
HPI Comments: Pt sitting in car yesterday and this she passed out x 3,  Pt not drving at the time, no know head trauma    Pt with episode of syncope last month, seen at [facility]- pt sts she was seen in ED, had ct head and lab work and was told she was anemic, may be having sz and needed f/u wth neuro MD.  Pt also seen by pcp last week, whom started anemia work up, pt awaiting results    Since then with episodes of near syncope fatigue, chest pain, nausea, no vomiting,     Also with c/o back pain, muscles aches s/p syncope falling backwards 2 weeks ago,    Patient is a 41 y.o. female presenting with general illness, anemia, and dizziness. The history is provided by the patient.   Generalized Body Aches  This is a recurrent problem. Associated symptoms include chest pain, abdominal pain and headaches. Pertinent negatives include no shortness of breath. Nothing relieves the symptoms. She has tried nothing for the symptoms.   Anemia  Associated symptoms include chest pain, abdominal pain and headaches. Pertinent negatives include no shortness of breath.   Dizziness  Associated symptoms include chest pain and headaches. Pertinent negatives include no shortness of breath.        Past Medical History   Diagnosis Date   ??? Diabetes    ??? Asthma    ??? Endometrial thickening on ultra sound         Past Surgical History   Procedure Date   ??? Hx orthopaedic      back   ??? Hx appendectomy    ??? Hx gyn      tubal         No family history on file.     History     Social History   ??? Marital Status: LEGALLY SEPARATED     Spouse Name: N/A     Number of Children: N/A   ??? Years of Education: N/A     Occupational History   ??? Not on file.     Social History Main Topics   ??? Smoking status: Former Smoker -- 10 years     Quit date: 07/11/2010   ??? Smokeless tobacco: Never Used   ??? Alcohol Use: No   ??? Drug Use: No   ??? Sexually Active: Yes -- Female partner(s)     Birth Control/ Protection: None     Other Topics Concern   ??? Not on file      Social History Narrative   ??? No narrative on file                  ALLERGIES: Erythromycin; Levaquin; Macrolide antibiotics; Nsaids (non-steroidal anti-inflammatory drug); Pcn; and Ultram      Review of Systems   Respiratory: Negative for cough, shortness of breath, wheezing and stridor.    Cardiovascular: Positive for chest pain.   Gastrointestinal: Positive for abdominal pain.   Genitourinary: Negative for dysuria, hematuria, difficulty urinating and dyspareunia.   Musculoskeletal: Positive for myalgias, back pain and arthralgias.   Skin: Negative.    Neurological: Positive for dizziness and headaches.       Filed Vitals:    12/03/11 0003 12/03/11 0013   BP: 167/94    Pulse: 81    Temp: 97.8 ??F (36.6 ??C)    Resp: 20    Height:  5\' 5"  (1.651 m)   Weight: 108.126 kg (238 lb 6 oz)  SpO2: 98%             Physical Exam   Nursing note and vitals reviewed.  Constitutional: She is oriented to person, place, and time. She appears well-developed and well-nourished.   Eyes: EOM are normal. Pupils are equal, round, and reactive to light.   Neck: Normal range of motion.   Cardiovascular: Normal rate, regular rhythm and normal heart sounds.    Pulmonary/Chest: Effort normal and breath sounds normal.   Abdominal: Soft. Bowel sounds are normal.   Musculoskeletal: Normal range of motion. She exhibits tenderness (paraspinal lumbar tenderness).   Neurological: She is alert and oriented to person, place, and time.   Skin: Skin is warm and dry.   Psychiatric: She has a normal mood and affect. Her behavior is normal.        MDM     Differential Diagnosis; Clinical Impression; Plan:     Ddx: metabolic derangement, arrhythmia, cerebellar infarct/cva, anemia, acs    ekg w/ nl int, nl axis no st changes      Imp:  Iron def anemia, syncope vs near syncope    Plan:  H/h stable from h/h checked 2 days ago. Case discussed w/hospitalist- given stable h/h. Iron studies showing defciency and resolving current sx, no admission needed at this  time. (+) orthostatic vs but improved sx after ivfs.  rx for  Iron supplement, pain med given. I have discussed with patient their diagnosis, treatment, and follow up plan, including pcp f/u. The patient agrees to follow up as outlined in discharge paperwork and also to return to the ED with any worsening. Bradd Burner, MD        Amount and/or Complexity of Data Reviewed:   Clinical lab tests:  Ordered and reviewed  Tests in the radiology section of CPT??:  Ordered and reviewed  Critical Care:   Total time providing critical care:  30-74 minutes (Excluding all other billable procedures.)      Procedures

## 2011-12-03 NOTE — ED Notes (Signed)
Pt c/o headache, chest tightness, nausea, syncope episode, abdominal pain. Symptom onset: yesterday; syncope episodes x 3 started today.

## 2011-12-03 NOTE — ED Notes (Signed)
Pt rates pain 10/10, dr green notified

## 2011-12-03 NOTE — ED Notes (Signed)
Attempted to obtain rectal stool sample, unable to obtain, dr green notified

## 2011-12-03 NOTE — ED Notes (Signed)
hospitalist paged

## 2011-12-03 NOTE — ED Notes (Signed)
Pt discharged to home with instructions and 2 scripts.

## 2011-12-03 NOTE — ED Notes (Signed)
Bedside shift change report given to Chase RN (oncoming nurse) by Sharron RN (offgoing nurse).  Report given with SBAR, Kardex, ED Summary and MAR.

## 2011-12-03 NOTE — ED Notes (Signed)
Port. Chest xray completed

## 2011-12-04 LAB — CULTURE, URINE
Colonies Counted: 55000
Colony Count: 55000

## 2011-12-04 NOTE — Telephone Encounter (Signed)
Patient is calling to get recent labs please advise

## 2011-12-04 NOTE — Progress Notes (Signed)
Out bound call to patient appearing on the Ed d/c list for 12/03/11- LM on Vm for return call

## 2011-12-05 NOTE — Telephone Encounter (Deleted)
Patient is calling again regarding her lab results. Would like a nurse to call her as soon as possible.

## 2011-12-05 NOTE — Progress Notes (Signed)
Patient returned call from message left- her chest continues to hurt and she continues with shortness of breath- explained that the symptoms are caused by the lack of circulating RBC's that carry oxygen to her body- she is light headed and dizzy when she moves quickly- she has not picked up the ferrous sulfate from Rx but she was encouraged to start taking them today - reminded that iron can be constipating and she will need to increase her fluid intake and monitor for signs of constipation    Appt made with Dr Bonnita Nasuti for 9/6/at 1:30p

## 2011-12-05 NOTE — Telephone Encounter (Signed)
Patient is calling again regarding her lab results. Would like a nurse to call her as soon as possible.

## 2011-12-06 NOTE — Telephone Encounter (Signed)
Pt has appt 12/07/11.  Will review labs at that time.

## 2011-12-07 NOTE — Telephone Encounter (Signed)
Patient currently being seen in office today.

## 2011-12-07 NOTE — Progress Notes (Signed)
.    Chief Complaint   Patient presents with   ??? Follow-up     f/u on anemia     .Reviewed record in preparation for visit and have obtained necessary documentation

## 2011-12-07 NOTE — Telephone Encounter (Signed)
Called patient to inform her to continue her iron supplements. She is to f/u with the MD in two months.  Left Message:  "This is [name] from [facility], trying to reach Jody [name].  You may give me a call back at [phone], and ask for [facility]. Thank you."

## 2011-12-07 NOTE — Progress Notes (Signed)
HISTORY OF PRESENT ILLNESS  Jody [name] is a 41 y.o. female presents for ED follow up.     HPI  Pt went to the ED twice, once here and the other at [facility], for anemia and syncopal episodes. Pt states that she continues to have syncopal episodes, but denies any injury.  She does not have a neurology appt scheduled. She continues to note some chest pain.   Denies any hx of seizures. Denies any usage of illicit drugs or alcohol.   Pt states that the menorrhagia has resolved.  LMP 8/15 with some spotting. She is not currently taking any more provera.     ROS  Pertinent ROS performed and negative except as mentioned in HPI.     Physical Exam   Vitals reviewed.  Constitutional:        Pale appearing   Cardiovascular: Normal rate, regular rhythm and normal heart sounds.  Exam reveals no gallop and no friction rub.    No murmur heard.  Pulmonary/Chest: Effort normal and breath sounds normal. No respiratory distress. She has no wheezes. She has no rales.   Abdominal: Soft. Bowel sounds are normal. She exhibits no distension. There is no tenderness. There is no rebound and no guarding.     Lab Results   Component Value Date/Time    WBC 7.0 12/07/2011  2:15 PM    HGB 8.8 12/07/2011  2:15 PM    HCT 29.2 12/07/2011  2:15 PM    PLATELET 345 12/07/2011  2:15 PM    MCV 72 12/07/2011  2:15 PM       ASSESSMENT and PLAN  1. Syncope  REFERRAL TO NEUROLOGY   2. Anemia  CBC W/O DIFF, ferrous sulfate 325 mg (65 mg iron) tablet   3. Diabetes mellitus  metFORMIN (GLUCOPHAGE) 1,000 mg tablet, glipiZIDE (GLUCOTROL) 5 mg tablet   pt also states that she is out of her cholesterol medication but is unsure of the name. Encouraged pt to return with her prescriptions.   -anemia-encouraged po iron. hgb stable.   -syncope-likely secondary to anemia. Pt referred to neurology to exclude other etiologies.

## 2011-12-07 NOTE — Patient Instructions (Signed)
Fainting: After Your Visit  Your Care Instructions  When you faint, or pass out, you lose consciousness for a short time. A brief drop in blood flow to the brain often causes it. When you fall or lie down, more blood flows to your brain and you regain consciousness.  Emotional stress, pain, or overheating???especially if you have been standing???can make you faint. In these cases, fainting is usually not serious. But fainting can be a sign of a more serious problem. Your doctor may want you to have more tests to rule out other causes.  The treatment you need depends on the reason why you fainted.  The doctor has checked you carefully, but problems can develop later. If you notice any problems or new symptoms, get medical treatment right away.  Follow-up care is a key part of your treatment and safety. Be sure to make and go to all appointments, and call your doctor if you are having problems. It's also a good idea to know your test results and keep a list of the medicines you take.  How can you care for yourself at home?  ?? Drink plenty of fluids to prevent dehydration. If you have kidney, heart, or liver disease and have to limit fluids, talk with your doctor before you increase your fluid intake.  When should you call for help?  Call 911 anytime you think you may need emergency care. For example, call if:  ?? You have symptoms of a heart problem. These may include:  ?? Chest pain or pressure.  ?? Severe trouble breathing.  ?? A fast or irregular heartbeat.  ?? Lightheadedness or sudden weakness.  ?? Coughing up pink, foamy mucus.  ?? Passing out.  After you call 911, the operator may tell you to chew 1 adult-strength or 2 to 4 low-dose aspirin. Wait for an ambulance. Do not try to drive yourself.  ?? You have symptoms of a stroke. These may include:  ?? Sudden numbness, tingling, weakness, or loss of movement in your face, arm, or leg, especially on only one side of your body.  ?? Sudden vision changes.  ?? Sudden trouble  speaking.  ?? Sudden confusion or trouble understanding simple statements.  ?? Sudden problems with walking or balance.  ?? A sudden, severe headache that is different from past headaches.  ?? You passed out (lost consciousness) again.  Watch closely for changes in your health, and be sure to contact your doctor if:  ?? You do not get better as expected.   Where can you learn more?   Go to [email or web address]  Enter A848 in the search box to learn more about "Fainting: After Your Visit."   ?? 2006-2013 [employer]. Care instructions adapted under license by [employer] (which disclaims liability or warranty for this information). This care instruction is for use with your licensed healthcare professional. If you have questions about a medical condition or this instruction, always ask your healthcare professional. [employer] disclaims any warranty or liability for your use of this information.  Content Version: 9.7.130178; Last Revised: September 27, 2010            Iron Deficiency Anemia: After Your Visit  Your Care Instructions  Anemia means that you do not have enough red blood cells, which carry oxygen to your body's tissues. You may get iron deficiency anemia if you do not get enough iron in your diet or if your body has trouble absorbing iron. But the most common cause of iron  deficiency anemia is loss of blood, such as from heavy menstrual periods or from bleeding in your stomach or bowel. Pregnancy also can cause iron deficiency anemia because a pregnant woman needs more iron. Anemia develops slowly after your body's stores of iron are used up. You may become pale, weak, and tired.  Your doctor may need to do more tests to find the exact cause of your anemia. If a disease or another health problem is causing your anemia, your doctor will treat the problem. You will need to follow up with your doctor to make sure your iron level returns to normal.  Follow-up care is a key part  of your treatment and safety. Be sure to make and go to all appointments, and call your doctor if you are having problems. It???s also a good idea to know your test results and keep a list of the medicines you take.  How can you care for yourself at home?  ?? If your doctor recommended iron pills, take them as directed.  ?? Try to take the pills on an empty stomach about 1 hour before or 2 hours after meals. But you may need to take iron with food to avoid an upset stomach.  ?? Do not take antacids or drink milk or caffeine drinks (such as coffee, tea, or cola) at the same time or within 2 hours of the time that you take your iron. They can keep your body from absorbing the iron well.  ?? Vitamin C helps your body absorb iron. You may want to take iron pills with a glass of orange juice or some other food high in vitamin C.  ?? Iron pills may cause stomach problems, such as heartburn, nausea, diarrhea, constipation, and cramps. Be sure to drink plenty of fluids and include fruits, vegetables, and fiber in your diet each day. Iron pills can change the color of your stool to a greenish or grayish black. This is normal, but because internal bleeding can also cause dark stool, be sure to mention any color changes to your doctor.  ?? Call your doctor if you think you are having a problem with your iron pills. Even after you start feeling better, it will take several months for your body to build up its supply of iron.  ?? If you miss taking a pill on time, do not take a double dose of iron.  ?? Keep iron pills out of the reach of small children. An overdose of iron can be very dangerous.  ?? Eat foods rich in iron, such as red meat, shellfish, poultry, eggs, beans, raisins, whole-grain bread, and leafy green vegetables.  ?? Steam vegetables to help them keep their iron content.  ?? Do not take nonsteroidal anti-inflammatory pain relievers, such as aspirin, naproxen (Aleve), or ibuprofen (Advil, Motrin) unless your doctor tells you  to.  ?? Liquid forms of iron can stain your teeth. You can mix a dose of liquid iron in water, fruit juice, or tomato juice and then drink it with a straw so that it does not get on your teeth.  When should you call for help?  Call 911 anytime you think you may need emergency care. For example, call if:  ?? You passed out (lost consciousness).  ?? You vomit blood or what looks like coffee grounds.  ?? You pass maroon or very bloody stools.  Call your doctor now or seek immediate medical care if:  ?? Your stools are black and tarlike or have  streaks of blood.  ?? You are dizzy or lightheaded, or you feel like you may faint.  Watch closely for changes in your health, and be sure to contact your doctor if:  ?? Your fatigue and weakness continue or get worse.  ?? You have side effects from taking iron pills, such as nausea, vomiting, constipation, diarrhea, or heartburn.  ?? You do not get better as expected.   Where can you learn more?   Go to [email or web address]  Enter [phone] in the search box to learn more about "Iron Deficiency Anemia: After Your Visit."   ?? 2006-2013 [employer]. Care instructions adapted under license by [employer] (which disclaims liability or warranty for this information). This care instruction is for use with your licensed healthcare professional. If you have questions about a medical condition or this instruction, always ask your healthcare professional. [employer] disclaims any warranty or liability for your use of this information.  Content Version: 9.7.130178; Last Revised: September 12, 2009            Anemia: After Your Visit  Your Care Instructions  Anemia is a low level of red blood cells, which carry oxygen throughout your body. Many things can cause anemia. Lack of iron is one of the most common causes. Your body needs iron to make hemoglobin, a substance in red blood cells that carries oxygen from the lungs to your body's cells. Without enough iron, the  body produces fewer and smaller red blood cells. As a result, your body???s cells do not get enough oxygen, and you feel tired and weak. And you may have trouble concentrating.  Bleeding is the most common cause of a lack of iron. You may have heavy menstrual bleeding or bleeding caused by conditions such as ulcers, hemorrhoids, or cancer. Regular use of aspirin or other anti-inflammatory medicines (such as ibuprofen) also can cause bleeding in some people. A lack of iron in your diet also can cause anemia, especially at times when the body needs more iron, such as during pregnancy, infancy, and the teen years.  Your doctor may have prescribed iron pills. It may take several months of treatment for your iron levels to return to normal. Your doctor also may suggest that you eat foods that are rich in iron, such as meat and beans.  There are many other causes of anemia. It is not always due to a lack of iron. Finding the specific cause of your anemia will help your doctor find the right treatment for you.  Follow-up care is a key part of your treatment and safety. Be sure to make and go to all appointments, and call your doctor if you are having problems. It???s also a good idea to know your test results and keep a list of the medicines you take.  How can you care for yourself at home?  ?? Take your medicines exactly as prescribed. Call your doctor if you think you are having a problem with your medicine.  ?? If your doctor recommends iron pills, take them as directed:  ?? Try to take the pills on an empty stomach about 1 hour before or 2 hours after meals. But you may need to take iron with food to avoid an upset stomach.  ?? Do not take antacids or drink milk or caffeine drinks (such as coffee, tea, or cola) at the same time or within 2 hours of the time that you take your iron. They can make it hard for your body  to absorb the iron.  ?? Vitamin C (from food or supplements) helps your body absorb iron. Try taking iron pills  with a glass of orange juice or some other food that is high in vitamin C, such as citrus fruits.  ?? Iron pills may cause stomach problems, such as heartburn, nausea, diarrhea, constipation, and cramps. Be sure to drink plenty of fluids, and include fruits, vegetables, and fiber in your diet each day. Iron pills often make your bowel movements dark or green.  ?? If you forget to take an iron pill, do not take a double dose of iron the next time you take a pill.  ?? Keep iron pills out of the reach of small children. An overdose of iron can be very dangerous.  ?? Follow your doctor's advice about eating iron-rich foods. These include red meat, shellfish, poultry, eggs, beans, raisins, whole-grain bread, and leafy green vegetables.  ?? Steam vegetables to help them keep their iron content.  When should you call for help?  Call 911 anytime you think you may need emergency care. For example, call if:  ?? You have symptoms of a heart attack. These may include:  ?? Chest pain or pressure, or a strange feeling in the chest.  ?? Sweating.  ?? Shortness of breath.  ?? Nausea or vomiting.  ?? Pain, pressure, or a strange feeling in the back, neck, jaw, or upper belly or in one or both shoulders or arms.  ?? Lightheadedness or sudden weakness.  ?? A fast or irregular heartbeat.  After you call 911, the operator may tell you to chew 1 adult-strength or 2 to 4 low-dose aspirin. Wait for an ambulance. Do not try to drive yourself.  ?? You passed out (lost consciousness).  ?? You vomit blood or what looks like coffee grounds.  ?? You pass maroon or very bloody stools.  Call your doctor now or seek immediate medical care if:  ?? You have new or increased shortness of breath.  ?? You are dizzy or lightheaded, or you feel like you may faint.  ?? You have any unusual bleeding, such as:  ?? Blood spots under the skin.  ?? A nosebleed that you cannot stop.  ?? Bleeding gums when you brush your teeth.  ?? Blood in your urine.  ?? Vaginal bleeding when you are  not having your period, or heavy period bleeding.  ?? Your stools are black and tarlike or have streaks of blood.  ?? Your fatigue and weakness continue or get worse.  Watch closely for changes in your health, and be sure to contact your doctor if:  ?? You do not get better as expected.   Where can you learn more?   Go to [email or web address]  Enter R301 in the search box to learn more about "Anemia: After Your Visit."   ?? 2006-2013 [employer]. Care instructions adapted under license by [employer] (which disclaims liability or warranty for this information). This care instruction is for use with your licensed healthcare professional. If you have questions about a medical condition or this instruction, always ask your healthcare professional. [employer] disclaims any warranty or liability for your use of this information.  Content Version: 9.7.130178; Last Revised: September 12, 2009

## 2011-12-08 LAB — CBC W/O DIFF
HCT: 29.2 % — ABNORMAL LOW (ref 34.0–46.6)
HGB: 8.8 g/dL — ABNORMAL LOW (ref 11.1–15.9)
MCH: 21.7 pg — ABNORMAL LOW (ref 26.6–33.0)
MCHC: 30.1 g/dL — ABNORMAL LOW (ref 31.5–35.7)
MCV: 72 fL — ABNORMAL LOW (ref 79–97)
PLATELET: 345 10*3/uL (ref 140–415)
RBC: 4.06 x10E6/uL (ref 3.77–5.28)
RDW: 18.4 % — ABNORMAL HIGH (ref 12.3–15.4)
WBC: 7 10*3/uL (ref 4.0–10.5)

## 2011-12-09 NOTE — Progress Notes (Signed)
Quick Note:    Hgb stable. Encouraged pt to take her iron.  ______

## 2011-12-15 LAB — AMB POC HEMOGLOBIN (HGB): Hemoglobin (POC): 9.9

## 2011-12-15 NOTE — Progress Notes (Signed)
Blood counts are low - hgb 8.8    Was having baseball size clots, had endometrial biopsy by Dr. Orvan Falconer  Now not bleeding at all    Last LMP was before April  Had EMBx which was negative    TVUS showed enlarged uterus, with markedly thickened endometrial stripe  Was given provera     States that she has a weak heart, beats slow and skips a beat    Diabetic  States has seen blood in her stool    Review of pt's chart:  Diabetes in good control  Needs workup for blood in stool  Did not have withdrawal after being placed on provera    PE:  General -- awake, alert  Lungs -- clear bilaterally  CV -- regular rhythm, ECG -- NSR  Skin-- papular rash on both forearms    Assessment:  Iron deficiency anemia  Thickened endometrial stripe  Reported blood in stool    Plan:  Stool cards  Recommend steroid cream for rash  Schedule TVUS to assess endometrial stripe  Needs PAP smear  F/U with me in two weeks after US done

## 2011-12-15 NOTE — Progress Notes (Signed)
Chief Complaint   Patient presents with   ??? Follow-up   ??? Anemia     Reviewed record in preparation for visit and have obtained necessary documentation.

## 2011-12-18 NOTE — Telephone Encounter (Signed)
Patient is calling to speak to Dr.hendricks nurse about getting an apt for a ref? She was told Dr.hendricks nurse would call her back please advise

## 2011-12-19 NOTE — Telephone Encounter (Signed)
Dr. Hendricks/ Telephone            Sent: Wed December 19, 2011 12:21 PM   MRN: [id number] DOB: 12-10-1970     Pt Home: [phone]          Message      Patient needs to speak with you  Her number is [phone].

## 2011-12-20 NOTE — Progress Notes (Signed)
Chief Complaint   Patient presents with   ??? Headache     x 2 days        Toradol 60 mg given in left gluteus, pt tolerated well. No adverse reaction noted.

## 2011-12-20 NOTE — Progress Notes (Signed)
Subjective:      Nickols Pecina is a 41 y.o. female who comes in for evaluation of ongoing headache.    Description of Headache:  Location of pain: occipital, temporal  Radiation of pain?:none  Character of pain:aching, pulsating  Severity of pain: 7 out of 10.  Degree of functional impairment: moderate  Accompanying symptoms: photophobia  Prodromal sx?: none  Rapidity of onset: gradual    Current Use of Meds to Treat Headaches:  Abortive meds used for this headache: acetaminophen  Prophylactic meds used in general: none    Patient Active Problem List   Diagnosis Code   ??? Fibroids 218.9   ??? Depression 311   ??? Hyperlipidemia 272.4   ??? Diabetes mellitus 250.00   ??? GERD (gastroesophageal reflux disease) 530.81     Current Outpatient Prescriptions   Medication Sig Dispense Refill   ??? ketorolac tromethamine (TORADOL) 60 mg/2 mL Soln 2 mL by IntraMUSCular route once for 1 dose.  1 Vial  0   ??? triamcinolone acetonide (KENALOG) 0.1 % topical cream Apply  to affected area two (2) times a day. use thin layer  45 g  0   ??? ferrous sulfate 325 mg (65 mg iron) tablet Take 1 Tab by mouth three (3) times daily (with meals) for 30 days.  90 Tab  0   ??? metFORMIN (GLUCOPHAGE) 1,000 mg tablet Take 1 Tab by mouth two (2) times daily (with meals).  60 Tab  1   ??? glipiZIDE (GLUCOTROL) 5 mg tablet Take 1 Tab by mouth two (2) times a day.  60 Tab  0   ??? oxyCODONE-acetaminophen (PERCOCET) 7.5-325 mg per tablet Take 1 Tab by mouth two (2) times a day.       ??? ciprofloxacin (CIPRO) 500 mg tablet Take 500 mg by mouth two (2) times a day.       ??? HYDROcodone-acetaminophen (LORTAB) 2.5-500 mg per tablet Take 1 Tab by mouth every six (6) hours as needed for Pain.  40 Tab  0   ??? traMADol-acetaminophen (ULTRACET) 37.5-325 mg per tablet Take 2 Tabs by mouth every six (6) hours as needed for Pain.  60 Tab  0   ??? medroxyPROGESTERone (PROVERA) 5 mg tablet One tab daily for the first 10-12 days of the month for menorrhagia  30 Tab  0     Allergies    Allergen Reactions   ??? Erythromycin Nausea and Vomiting and Swelling   ??? Levaquin (Levofloxacin) Rash and Swelling   ??? Macrolide Antibiotics Rash, Nausea and Vomiting and Swelling   ??? Nsaids (Non-Steroidal Anti-Inflammatory Drug) Other (comments)     Per pt, was told by GI d/t stomach ulcers   ??? Pcn (Penicillins) Nausea and Vomiting and Swelling   ??? Ultram (Tramadol) Nausea Only        Review of Systems  Review of Systems  Constitutional: negative for fevers, chills, fatigue and weight loss  Eyes: negative for visual disturbance, irritation and redness  Ears, nose, mouth, throat, and face: negative for nasal congestion, snoring, sore throat and hoarseness  Respiratory: negative for cough, sputum, hemoptysis, wheezing or dyspnea on exertion  Cardiovascular: negative for chest pain, palpitations, irregular heart beats, fatigue, lower extremity edema  Gastrointestinal: negative for reflux symptoms, nausea, vomiting, diarrhea, constipation and abdominal pain  Musculoskeletal:negative for stiff joints, neck pain, back pain and muscle weakness  Neurological: negative for dizziness, memory problems and gait problems; + for headache      Objective:   BP  115/76   Pulse 77   Temp 97.9 ??F (36.6 ??C) (Oral)   Ht 5\' 5"  (1.651 m)   Wt 247 lb 6.4 oz (112.22 kg)   BMI 41.17 kg/m2   SpO2 98%   LMP 11/15/2011    General: alert, cooperative, mild distress   Temporal artery tender:  no   Eyes:  conjunctivae/corneas clear. PERRL, EOM's intact. Fundi benign   ENT: ENT exam normal, no neck nodes or sinus tenderness.    Head/neck bruits:  None   Neck:  supple, no significant adenopathy.    Neurologic:  normal without focal findings  mental status, speech normal, alert and oriented x iii  PERLA  reflexes normal and symmetric     Assessment/Plan:     Tension headache, episodice    1. Additional workup: None  2. Lifestyle changes: sleep hygiene, exercise (daily), avoid precipitants (alcohol and cigarettes)  3. Abortive medications to be given  in clinic:  Ketorolac 60 mg IM  4. Abortive medications to use in the future: acetaminophen  5. Prophylactic medications: riboflavin 400mg  QD    1. Tension headache  ketorolac tromethamine (TORADOL) 60 mg/2 mL Soln, KETOROLAC TROMETHAMINE INJ, PR THER/PROPH/DIAG INJECTION, SUBCUT/IM   .    Orders Placed This Encounter   ??? ketorolac tromethamine (TORADOL) 60 mg/2 mL Soln

## 2011-12-20 NOTE — Telephone Encounter (Signed)
S/w pt regarding her order for a TVUS per Dr. Robby Sermon.  Order has been placed and informed pt that [facility] from scheduling will call her to make that appt.  Pt also stated that she has been having migraines of which she has a history of.  Recommended that she take Tylenol, she said she was.  Recommended ibuprofen or naproxen and she stated that she can't take those d/t stomach ulcers.  Informed pt that she would have to be seen if that was the case.  Pt stated she may come in today to be seen.

## 2011-12-20 NOTE — Addendum Note (Signed)
Addended by: Laruth Bouchard on: 12/20/2011 02:08 PM     Modules accepted: Orders

## 2011-12-21 LAB — AMB POC FECAL BLOOD, OCCULT, QL 3 CARDS
Hemoccult (POC): POSITIVE
Occult Blood-2 (POC): NEGATIVE
Occult blood-3 (POC): NEGATIVE

## 2011-12-21 NOTE — Telephone Encounter (Signed)
Pt called to update Dr Nelson Chimes that she stayed in the rest room all night from the shot Tordal recvd, and headache has not  improved, was up all night cramping.  Plz call to advise!  Thanks,

## 2011-12-21 NOTE — Telephone Encounter (Signed)
Spoke to patient. She has been feeling a little better; however she has the urge to urinate but cannot. Stated that she urinated today "just not as much". She stated that she spoke to PharmD and they suggested since she is diabetic that she call the office. I asked her to increase her fluids. I also asked that if she wants she can take some warm water an sit on toilet and gently pour over her labia to stimulate and hopefully urinate.   I did tell her that if this continues she may need to be cathed to see if she has any retention issues.

## 2011-12-22 NOTE — Progress Notes (Signed)
Refer to GI for workup of positive FOBT

## 2011-12-22 NOTE — Progress Notes (Signed)
Quick Note:    Needs referral to GI    ______

## 2011-12-24 NOTE — ED Notes (Signed)
Patient (s) given copy of dc instructions and one script (s).  Patient (s)  verbalized understanding of instructions and scrip (s).  Patient given a current medication reconciliation form and verbalized understanding of their medications.  Patient (s) verbalized understanding of the importance of discussing medications with his or her physician or clinic they will be following up with.  Patient alert and oriented and in no acute distress.  Patient discharged home ambulatory with female companion

## 2011-12-24 NOTE — ED Provider Notes (Signed)
I have personally seen and evaluated patient. I find the patient's history and physical exam are consistent with the PA's NP documentation. I agree with the care provided, treatments rendered, disposition and follow up plan.

## 2011-12-24 NOTE — ED Provider Notes (Signed)
HPI Comments: Patient presents with multiple complaints including chest pain, abdominal pain, cough and dizziness. Patient reports she has had these symptoms "for a long time" patient unable to quantify how long. Patient reports being seen by her PCP for this, seen by OB-GYN, patient had ultra sound today. Patient also reports she is anemic. Denies cardiac history, denies ever having stress test.    Patient is a 41 y.o. female presenting with chest pain, abdominal pain, cough, and headaches. The history is provided by the patient.   Chest Pain   This is a new problem. The current episode started more than 1 week ago. The problem has been gradually worsening. The problem occurs constantly. The pain is associated with normal activity. The pain is present in the left side. The pain is at a severity of 9/10. The pain is severe. The quality of the pain is described as sharp. The pain does not radiate. The symptoms are aggravated by palpation. Associated symptoms include abdominal pain, cough, dizziness and headaches. She has tried nothing for the symptoms. Risk factors include diabetes mellitus and obesity.   Abdominal Pain   Associated symptoms include headaches and chest pain.   Cough  Associated symptoms include chest pain and headaches.   Headache   Associated symptoms include dizziness.        Past Medical History   Diagnosis Date   ??? Diabetes    ??? Asthma    ??? Endometrial thickening on ultra sound         Past Surgical History   Procedure Date   ??? Hx orthopaedic      back   ??? Hx appendectomy    ??? Hx gyn      tubal         No family history on file.     History     Social History   ??? Marital Status: LEGALLY SEPARATED     Spouse Name: N/A     Number of Children: N/A   ??? Years of Education: N/A     Occupational History   ??? Not on file.     Social History Main Topics   ??? Smoking status: Former Smoker -- 10 years     Quit date: 07/11/2010   ??? Smokeless tobacco: Never Used   ??? Alcohol Use: No   ??? Drug Use: No   ??? Sexually  Active: Yes -- Female partner(s)     Birth Control/ Protection: None     Other Topics Concern   ??? Not on file     Social History Narrative   ??? No narrative on file                  ALLERGIES: Erythromycin; Levaquin; Macrolide antibiotics; Nsaids (non-steroidal anti-inflammatory drug); Pcn; and Ultram      Review of Systems   Constitutional: Negative.    Eyes: Negative.    Respiratory: Positive for cough.    Cardiovascular: Positive for chest pain.   Gastrointestinal: Positive for abdominal pain.   Genitourinary: Negative.    Musculoskeletal: Negative.    Skin: Negative.    Neurological: Positive for dizziness and headaches.   Hematological: Negative.    Psychiatric/Behavioral: Negative.    All other systems reviewed and are negative.        Filed Vitals:    12/24/11 1951   BP: 142/70   Pulse: 85   Temp: 98.1 ??F (36.7 ??C)   Resp: 18   Height: 5\' 5"  (1.651 m)  Weight: 108.863 kg (240 lb)   SpO2: 96%            Physical Exam   Nursing note and vitals reviewed.  Constitutional: She is oriented to person, place, and time. She appears well-developed and well-nourished.   HENT:   Head: Normocephalic and atraumatic.   Right Ear: External ear normal.   Left Ear: External ear normal.   Mouth/Throat: Oropharynx is clear and moist. No oropharyngeal exudate.   Eyes: Conjunctivae and EOM are normal. Pupils are equal, round, and reactive to light. Right eye exhibits no discharge. Left eye exhibits no discharge. No scleral icterus.   Neck: Normal range of motion. No tracheal deviation present. No thyromegaly present.   Cardiovascular: Normal rate, regular rhythm and normal heart sounds.    No murmur heard.  Pulmonary/Chest: Effort normal. No respiratory distress. She has wheezes. She has no rales. She exhibits no tenderness.       Abdominal: Soft. Bowel sounds are normal. She exhibits no distension. There is no tenderness. There is no rebound and no guarding.       Musculoskeletal: Normal range of motion. She exhibits no edema and  no tenderness.   Lymphadenopathy:     She has no cervical adenopathy.   Neurological: She is alert and oriented to person, place, and time. No cranial nerve deficit. Coordination normal.   Skin: Skin is warm. No erythema.   Psychiatric: She has a normal mood and affect. Her behavior is normal. Judgment and thought content normal.        MDM    Procedures

## 2011-12-24 NOTE — ED Notes (Signed)
Provider at bs to discuss with pt discharge plan of care

## 2011-12-24 NOTE — ED Notes (Signed)
For last few days having chest pain, abd pain, cough which started first.  Has a headache for a couple of days.  Was given Toradol without relief and upset her stomach.  Dry cough.  Left chest pain that radiates per pt down left arm.

## 2011-12-25 LAB — CBC WITH AUTOMATED DIFF
ABS. BASOPHILS: 0 10*3/uL (ref 0.0–0.1)
ABS. EOSINOPHILS: 0.5 10*3/uL — ABNORMAL HIGH (ref 0.0–0.4)
ABS. LYMPHOCYTES: 3 10*3/uL (ref 0.8–3.5)
ABS. MONOCYTES: 0.5 10*3/uL (ref 0.0–1.0)
ABS. NEUTROPHILS: 6 10*3/uL (ref 1.8–8.0)
BASOPHILS: 0 % (ref 0–1)
EOSINOPHILS: 5 % (ref 0–7)
HCT: 34.4 % — ABNORMAL LOW (ref 35.0–47.0)
HGB: 11 g/dL — ABNORMAL LOW (ref 11.5–16.0)
LYMPHOCYTES: 30 % (ref 12–49)
MCH: 24.6 PG — ABNORMAL LOW (ref 26.0–34.0)
MCHC: 32 g/dL (ref 30.0–36.5)
MCV: 76.8 FL — ABNORMAL LOW (ref 80.0–99.0)
MONOCYTES: 5 % (ref 5–13)
NEUTROPHILS: 60 % (ref 32–75)
PLATELET: 270 10*3/uL (ref 150–400)
RBC: 4.48 M/uL (ref 3.80–5.20)
RDW: 23.5 % — ABNORMAL HIGH (ref 11.5–14.5)
WBC: 10 10*3/uL (ref 3.6–11.0)

## 2011-12-25 LAB — URINALYSIS W/ REFLEX CULTURE
Bacteria: NEGATIVE /HPF
Bilirubin: NEGATIVE
Glucose: NEGATIVE MG/DL
Ketone: NEGATIVE MG/DL
Leukocyte Esterase: NEGATIVE
Nitrites: NEGATIVE
Protein: NEGATIVE MG/DL
Specific gravity: <1.005 (ref 1.003–1.030)
Urobilinogen: 0.2 EU/DL (ref 0.2–1.0)
pH (UA): 6 (ref 5.0–8.0)

## 2011-12-25 LAB — METABOLIC PANEL, BASIC
Anion gap: 4 mmol/L — ABNORMAL LOW (ref 5–15)
BUN/Creatinine ratio: 22 — ABNORMAL HIGH (ref 12–20)
BUN: 13 MG/DL (ref 6–20)
CO2: 31 MMOL/L (ref 21–32)
Calcium: 8.8 MG/DL (ref 8.5–10.1)
Chloride: 99 MMOL/L (ref 97–108)
Creatinine: 0.58 MG/DL (ref 0.45–1.15)
GFR est AA: >60 mL/min/{1.73_m2} (ref >60)
GFR est non-AA: >60 mL/min/{1.73_m2} (ref >60)
Glucose: 119 MG/DL — ABNORMAL HIGH (ref 65–100)
Potassium: 4 MMOL/L (ref 3.5–5.1)
Sodium: 134 MMOL/L — ABNORMAL LOW (ref 136–145)

## 2011-12-25 LAB — EKG, 12 LEAD, INITIAL
Atrial Rate: 81 {beats}/min
Calculated P Axis: 18 degrees
Calculated R Axis: 47 degrees
Calculated T Axis: 38 degrees
Diagnosis: NORMAL
P-R Interval: 144 ms
Q-T Interval: 388 ms
QRS Duration: 86 ms
QTC Calculation (Bezet): 450 ms
Ventricular Rate: 81 {beats}/min

## 2011-12-25 LAB — HCG URINE, QL. - POC: Pregnancy test,urine (POC): NEGATIVE

## 2011-12-25 LAB — TROPONIN I: Troponin-I, Qt.: <0.04 ng/mL (ref <0.05)

## 2011-12-25 MED ORDER — BUTALBITAL-ACETAMINOPHEN-CAFFEINE 50 MG-325 MG-40 MG TAB
50-325-40 mg | ORAL | Status: AC
Start: 2011-12-25 — End: 2011-12-24
  Administered 2011-12-25: 01:00:00 via ORAL

## 2011-12-25 MED ADMIN — ondansetron (ZOFRAN) injection 4 mg: INTRAVENOUS | @ 01:00:00 | NDC 00409475503

## 2011-12-25 MED ADMIN — sodium chloride (NS) flush 5-10 mL: INTRAVENOUS | @ 01:00:00 | NDC 87701099893

## 2011-12-25 MED ADMIN — sodium chloride 0.9 % bolus infusion 1,000 mL: INTRAVENOUS | @ 01:00:00 | NDC 00409798309

## 2011-12-25 MED ADMIN — albuterol-ipratropium (DUO-NEB) 2.5 MG-0.5 MG/3 ML: RESPIRATORY_TRACT | @ 01:00:00 | NDC 00487020101

## 2011-12-25 MED FILL — BUTALBITAL-ACETAMINOPHEN-CAFFEINE 50 MG-325 MG-40 MG TAB: 50-325-40 mg | ORAL | Qty: 2

## 2011-12-25 MED FILL — IPRATROPIUM-ALBUTEROL 2.5 MG-0.5 MG/3 ML NEB SOLUTION: 2.5 mg-0.5 mg/3 ml | RESPIRATORY_TRACT | Qty: 3

## 2011-12-25 MED FILL — ONDANSETRON (PF) 4 MG/2 ML INJECTION: 4 mg/2 mL | INTRAMUSCULAR | Qty: 2

## 2011-12-25 MED FILL — SODIUM CHLORIDE 0.9 % IV: INTRAVENOUS | Qty: 1000

## 2011-12-25 NOTE — Telephone Encounter (Signed)
Message copied by Corine Shelter on Tue Dec 25, 2011  4:23 PM  ------       Message from: Narda Rutherford       Created: Sat Dec 22, 2011 11:43 AM         Needs referral to GI

## 2011-12-25 NOTE — Telephone Encounter (Signed)
Patient returned call. Has apt with Dr. Robby Sermon tmw (12/26/2011) said she would obtain results then.

## 2011-12-25 NOTE — Telephone Encounter (Signed)
Chief Complaint   Patient presents with   ??? Abnormal Lab Results     Called and left message to call me back

## 2011-12-26 MED ORDER — MEDROXYPROGESTERONE 10 MG TAB
10 mg | ORAL_TABLET | Freq: Every day | ORAL | Status: DC
Start: 2011-12-26 — End: 2011-12-26

## 2011-12-26 NOTE — Patient Instructions (Signed)
.Vaccine Information Statement    Inactivated Influenza Vaccine: What You Need to Know  2012 - 2013    Many Vaccine Information Statements are available in Spanish and other languages. See www.immunize.org/vis  Hojas de Informaci??n Sobre Vacunas est??n disponibles en espa??ol y en muchos otros idiomas. Visite www.immunize.org/vis      1. Why get vaccinated?    Influenza (???flu???) is a contagious disease.     It is caused by the influenza virus, which can be spread by coughing, sneezing, or nasal secretions.     Anyone can get influenza, but rates of infection are highest among children. For most people, symptoms last only a few days. They include:  ??? fever/chills   ??? sore throat ??? fatigue  ??? cough   ??? headache ??? muscle aches  ??? runny or stuffy nose    Other illnesses can have the same symptoms and are often mistaken for influenza.    Young children, people 65 and older, pregnant women, and people with certain health conditions ??? such as heart, lung or kidney disease or a weakened immune system ??? can get much sicker. Flu can cause high fever and pneumonia, and make existing medical conditions worse. It can cause diarrhea and seizures in children. Each year thousands of people die from influenza and even more require hospitalization.     By getting flu vaccine you can protect yourself from influenza and may also avoid spreading influenza to others.      2. Inactivated influenza vaccine    There are two types of influenza vaccine:     1. Inactivated (killed) vaccine, the ???flu shot,??? is given by injection with a needle.     2. Live, attenuated (weakened) influenza vaccine is sprayed into the nostrils. This vaccine is described in a separate Vaccine Information Statement.     A ???high-dose??? inactivated influenza vaccine is available for people 65 years of age and older. Ask your doctor for more information.    Influenza viruses are always changing, so annual vaccination is recommended. Each year scientists try to match the  viruses in the vaccine to those most likely to cause flu that year.  Flu vaccine will not prevent disease from other viruses, including flu viruses not contained in the vaccine.    It takes up to 2 weeks for protection to develop after the shot. Protection lasts about a year.     Some inactivated influenza vaccine contains a preservative called thimerosal. Thimerosal-free influenza vaccine is available. Ask your doctor for more information.      3. Who should get inactivated influenza vaccine and when?    WHO  All people 6 months of age and older should get flu vaccine.    Vaccination is especially important for people at higher risk of severe influenza and their close contacts, including healthcare personnel and close contacts of children younger than 6 months.    WHEN  Get the vaccine as soon as it is available. This should provide protection if the flu season comes early. You can get the vaccine as long as illness is occurring in your community.     Influenza can occur at any time, but most influenza occurs from October through May. In recent seasons, most infections have occurred in January and February. Getting vaccinated in December, or even later, will still be beneficial in most years.    Adults and older children need one dose of influenza vaccine each year. But some children younger than 9 years of   age need two doses to be protected. Ask your doctor.    Influenza vaccine may be given at the same time as other vaccines, including pneumococcal vaccine.      4. Some people should not get inactivated influenza vaccine or should wait.    ??? Tell your doctor if you have any severe (life-threatening) allergies, including a severe allergy to eggs. A severe allergy to any vaccine component may be a reason not to get the vaccine. Allergic reactions to influenza vaccine are rare.      ??? Tell your doctor if you ever had a severe reaction after a dose of influenza vaccine.     ??? Tell your doctor if you ever had  Guillain-Barr?? Syndrome (a severe paralytic illness, also called GBS). Your doctor will help you decide whether the vaccine is recommended for you.     ??? People who are moderately or severely ill should usually wait until they recover before getting flu vaccine. If you are ill, talk to your doctor about whether to reschedule the vaccination. People with a mild illness can usually get the vaccine.      5. What are the risks from inactivated influenza vaccine?    A vaccine, like any medicine, could possibly cause serious problems, such as severe allergic reactions. The risk of a vaccine causing serious harm, or death, is extremely small.     Serious problems from inactivated influenza vaccine are very rare. The viruses in inactivated influenza vaccine have been killed, so you cannot get influenza from the vaccine.     Mild problems can include:   ??? soreness, redness, or swelling where the shot was given   ??? hoarseness; sore, red or itchy eyes; cough  ??? fever     ??? aches  ??? headache ??? itching ??? fatigue  If these problems occur, they usually begin soon after the shot and last 1-2 days.     Moderate problems:  Young children who get inactivated flu vaccine and pneumococcal vaccine (PCV13) at the same time appear to be at increased risk for seizures caused by fever. Ask your doctor for more information.    Tell your doctor if a child who is getting flu vaccine has ever had a seizure.    Severe problems:  ??? Life-threatening allergic reactions from vaccines are very rare. If they do occur, it is usually within a few minutes to a few hours after the shot.     ??? In 1976, a type of inactivated influenza (swine flu) vaccine was associated with Guillain-Barr?? Syndrome (GBS). Since then, flu vaccines have not been clearly linked to GBS. However, if there is a risk of GBS from current flu vaccines, it would be no more than 1 or 2 cases per million people vaccinated. This is much lower than the risk of severe influenza, which can  be prevented by vaccination.    The safety of vaccines is always being monitored.  For more information, visit:   www.cdc.gov/vaccinesafety/Vaccine_Monitoring/Index.html and   www.cdc.gov/vaccinesafety/Activities/Activities_Index.html    One brand of inactivated flu vaccine, called Afluria, should not be given to children 8 years old or younger, except in special circumstances. A related vaccine was associated with fevers and fever-related seizures in young children in [place]. Your doctor can give you more information.                   6. What if there is a severe reaction?    What should I look for?    Any unusual condition, such as a high fever or unusual behavior. Signs of a severe allergic reaction can include difficulty breathing, hoarseness or wheezing, hives, paleness, weakness, a fast heart beat or dizziness.    What should I do?  ??? Call a doctor, or get the person to a doctor right away.  ??? Tell the doctor what happened, the date and time it happened, and when the vaccination was given.  ??? Ask your doctor, nurse, or health department to report the reaction by filing a Vaccine Adverse Event Reporting System (VAERS) form. Or you can file this report through the VAERS website at www.vaers.hhs.gov, or by calling 1-800-822-7967.     VAERS does not provide medical advice.      7. The National Vaccine Injury Compensation Program    The National Vaccine Injury Compensation Program (VICP) was created in 1986.    People who believe they may have been injured by a vaccine can learn about the program and about filing a claim by calling 1-800-338-2382, or visiting the VICP website at ww.hrsa.gov/vaccinecompensation.      8. How can I learn more?    ??? Ask your doctor. They can give you the vaccine package insert or suggest other sources of information.  ??? Call your local or state health department.  ??? Contact the Centers for Disease Control and Prevention (CDC):   - Call 1-800-232-4636 (1-800-CDC-INFO) or   - Visit  CDC???s website at www.cdc.gov/flu      Vaccine Information Statement (Interim)  Inactivated Influenza Vaccine (10/02/2010)       42 [place] ??300aa-26    Department of Health and Human Services  Centers for Disease Control and Prevention    Office Use Only

## 2011-12-26 NOTE — Progress Notes (Signed)
Had chest pain   Seen in the ED  Told that she had bronchitis    Feels like something is sitting on her chest  Has had pain for 2-3 weeks    Smoker -- quit two years ago    Has been using her albuterol inhaler at home    Recent pregnancy test was negative  G5P5 -- vaginal    ECG - NSR    PE:  General -- awake, alert  Lungs -- clear bilaterally  CV -- regular rhythm, S1S2  Chest -- pain reproducible    Patient Active Problem List   Diagnosis Code   ??? Fibroids 218.9   ??? Depression 311   ??? Hyperlipidemia 272.4   ??? Diabetes mellitus 250.00   ??? GERD (gastroesophageal reflux disease) 530.81     Had heme positive stool (one of three stool cards); would recommend GI referral (pt has [employer])    Pt reports not having had a period since April 2013 -- she did not have a withdrawal bleed after taking provera    Recommend referral to Dr. 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

## 2011-12-26 NOTE — Progress Notes (Signed)
Quick Note:    NSR  ______

## 2011-12-26 NOTE — Progress Notes (Signed)
Chief Complaint   Patient presents with   ??? Results     Pt is here today as a follow up from her lab (hemoccult) and [place] results. Pt was seen recently in the hospital and was diagnosed w/ bronchitis.

## 2011-12-27 NOTE — Telephone Encounter (Signed)
Ph [phone], pt called and was seen yesterday.  A medication of tramadol was to have been ordered for headaches.  Requesting a return call today.    Dr Kirstie Mirza office called pt yesterday and changed her scheduled appt of Oct 4 to Oct 15.

## 2011-12-28 NOTE — Telephone Encounter (Signed)
Pt. called regarding apt and ultrasound from 9/25 with Dr. Robby Sermon. Said she was supposed to receive a call yesterday regarding results and possible DNC. Would like a call back ASAP.

## 2011-12-28 NOTE — Telephone Encounter (Signed)
Medication was sent to the pharmacy per Dr. Robby Sermon

## 2012-01-01 NOTE — Progress Notes (Signed)
Quick Note:    Refer to Dr. 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

## 2012-01-03 NOTE — ED Notes (Signed)
Patient has been having abdominal pain and lower back pain for three days.  Has an appointment with GI tomorrow.  Straining to urinate. No vomiting or diarrhea

## 2012-01-04 LAB — URINALYSIS W/ REFLEX CULTURE
Bilirubin: NEGATIVE
Blood: NEGATIVE
Glucose: NEGATIVE MG/DL
Ketone: NEGATIVE MG/DL
Leukocyte Esterase: NEGATIVE
Nitrites: NEGATIVE
Protein: NEGATIVE MG/DL
Specific gravity: 1.03 (ref 1.003–1.030)
Urobilinogen: 0.2 EU/DL (ref 0.2–1.0)
pH (UA): 5.5 (ref 5.0–8.0)

## 2012-01-04 MED ADMIN — diazepam (VALIUM) injection 5 mg: INTRAVENOUS | @ 03:00:00 | NDC 00409127332

## 2012-01-04 MED ADMIN — morphine injection 4 mg: INTRAVENOUS | @ 04:00:00 | NDC 00409189101

## 2012-01-04 MED FILL — DIAZEPAM 5 MG/ML SYRINGE: 5 mg/mL | INTRAMUSCULAR | Qty: 2

## 2012-01-04 MED FILL — MORPHINE 4 MG/ML SYRINGE: 4 mg/mL | INTRAMUSCULAR | Qty: 1

## 2012-01-04 NOTE — ED Notes (Signed)
I have reviewed discharge instructions with the patient.  The patient verbalized understanding.

## 2012-01-04 NOTE — Telephone Encounter (Signed)
Pt. Called about ultrasound. Pt. Has colonoscopy scheduled 11/ 5/13.

## 2012-01-04 NOTE — ED Provider Notes (Signed)
Patient is a 41 y.o. female presenting with back pain. The history is provided by the patient.   Back Pain   This is a new problem. The current episode started more than 2 days ago. The problem has been gradually worsening. Patient reports no work related injury.The pain is associated with no known injury. The pain is present in the lower back. Radiates to: both legs. The pain is at a severity of 10/10. The symptoms are aggravated by certain positions. Associated symptoms include abdominal pain. Pertinent negatives include no chest pain, no fever, no abdominal swelling, no bowel incontinence, no perianal numbness, no bladder incontinence and no dysuria.        Past Medical History   Diagnosis Date   ??? Diabetes    ??? Asthma    ??? Endometrial thickening on ultra sound    ??? Diabetic neuropathy      Per pt diagnosed by a physician a [facility]        Past Surgical History   Procedure Date   ??? Hx orthopaedic      back   ??? Hx appendectomy    ??? Hx tubal ligation      tubal         No family history on file.     History     Social History   ??? Marital Status: LEGALLY SEPARATED     Spouse Name: N/A     Number of Children: N/A   ??? Years of Education: N/A     Occupational History   ??? Not on file.     Social History Main Topics   ??? Smoking status: Former Smoker -- 10 years     Quit date: 07/11/2010   ??? Smokeless tobacco: Never Used   ??? Alcohol Use: No   ??? Drug Use: No   ??? Sexually Active: Yes -- Female partner(s)     Birth Control/ Protection: None     Other Topics Concern   ??? Not on file     Social History Narrative   ??? No narrative on file                  ALLERGIES: Erythromycin; Levaquin; Macrolide antibiotics; Nsaids (non-steroidal anti-inflammatory drug); Pcn; and Ultram      Review of Systems   Constitutional: Negative for fever and chills.   Respiratory: Negative for shortness of breath.    Cardiovascular: Negative for chest pain.   Gastrointestinal: Positive for abdominal pain. Negative for nausea,  vomiting, constipation and bowel incontinence.   Genitourinary: Positive for difficulty urinating. Negative for bladder incontinence, dysuria, vaginal bleeding and vaginal discharge.   Musculoskeletal: Positive for back pain.   All other systems reviewed and are negative.        Filed Vitals:    01/03/12 2241   BP: 165/80   Pulse: 80   Temp: 97.6 ??F (36.4 ??C)   Resp: 20   Height: 5' 5.5" (1.664 m)   Weight: 108.863 kg (240 lb)   SpO2: 97%            Physical Exam   Nursing note and vitals reviewed.  Constitutional: She is oriented to person, place, and time. She appears well-developed and well-nourished. She appears distressed.   HENT:   Head: Normocephalic and atraumatic.   Mouth/Throat: Oropharynx is clear and moist.   Eyes: Conjunctivae and EOM are normal. Pupils are equal, round, and reactive to light.   Neck: Normal range of motion.  Cardiovascular: Normal rate, regular rhythm, normal heart sounds and intact distal pulses.    No murmur heard.  Pulmonary/Chest: Effort normal and breath sounds normal. No stridor. No respiratory distress.   Abdominal: Soft. Bowel sounds are normal. There is no tenderness.   Musculoskeletal: Normal range of motion. She exhibits tenderness. She exhibits no edema.        Lower back diffuse soft tissue tenderness   Neurological: She is alert and oriented to person, place, and time. No cranial nerve deficit.   Skin: Skin is warm and dry. She is not diaphoretic.   Psychiatric: She has a normal mood and affect.        MDM     Differential Diagnosis; Clinical Impression; Plan:     Low back pain - musculoskeletal strain.  Check Urine and if no UTI, will discharge with muscle relaxants and f/u with her doctor.  Amount and/or Complexity of Data Reviewed:   Clinical lab tests:  Ordered and reviewed  Progress:   Patient progress:  Stable      Procedures

## 2012-01-05 LAB — CULTURE, URINE
Colonies Counted: 40000
Colony Count: 40000
Culture result:: NORMAL
Culture: NORMAL

## 2012-01-06 NOTE — Progress Notes (Signed)
Quick Note:    Normal skin flora on urine culture, no action needed.  ______

## 2012-01-07 NOTE — Telephone Encounter (Signed)
Pt called valium that is not working when she was seen in the ER.  Pt needs to know if she she should return to ER or scheduled appt, still having back pain meds not working.  Plz call pt to advise!          Thanks

## 2012-01-07 NOTE — Telephone Encounter (Signed)
Called and left a voicemail for patient to call us back to schedule an appt, per ER note patient is to follow up with [place].

## 2012-01-07 NOTE — Telephone Encounter (Signed)
Pt stated that her primary care Dr Robby Sermon advised her that  our office was suppose to call her to set up a D& C with Dr Hyacinth Meeker two weeks ago pt states that she did not get a call

## 2012-01-07 NOTE — Telephone Encounter (Signed)
I called the patient to let Jody [name] know she needs to have a pre-operative appointment with Dr. Hyacinth Meeker before he can do surgery. I instructed Jody [name] to call back to schedule.

## 2012-01-11 NOTE — ED Notes (Signed)
Pt states that for the past week she has had lower back pain that is worse on the right side than left, she went to [facility] 2 days ago was given medication for a back strain and the pain but she feels she is getting worse not better while taking the medication, resting and applying ice and heat. No radiation of pain to legs or upper back

## 2012-01-11 NOTE — ED Notes (Signed)
Patient discharged with prescriptions and instructions by Dr. Harrie Jeans.  Discharge instructions reviewed with patient and questions answered.

## 2012-01-11 NOTE — ED Provider Notes (Signed)
HPI Comments: 41 year old we'll female with prior history of type 2 diabetes, asthma, lumbar disc disease presents to the ER complaining of one week of persistent low back pain. She in fact was seen at [facility] and diagnosed with a muscle strain and prescribed diazepam. She was also told she might have a urinary tract infection and was given Bactrim. In the ER she denies any urinary symptoms, fever, chills, nausea, vomiting or abdominal pain. She denies a flank pain or hematuria. The pain is worse when she tries to stand. There is some radiation into her buttocks but not her legs. She denies any gross weakness. She has no bowel or bladder incontinence And denies any perianal loss of sensation with wiping. She denies a history of IV drug abuse. In 2007 she had disc surgery secondary to a ruptured disc and states this feels somewhat like that. She drove herself to the hospital. She denies any history of kidney stones or aneurysm and has no gripping pain in her back.    Patient is a 41 y.o. female presenting with back pain.   Back Pain   Pertinent negatives include no chest pain, no fever, no abdominal pain and no pelvic pain.        Past Medical History   Diagnosis Date   ??? Diabetes    ??? Asthma    ??? Endometrial thickening on ultra sound    ??? Diabetic neuropathy      Per pt diagnosed by a physician a [facility]   ??? Lumbar disc disease         Past Surgical History   Procedure Date   ??? Hx orthopaedic      back   ??? Hx appendectomy    ??? Hx tubal ligation      tubal         History reviewed. No pertinent family history.     History     Social History   ??? Marital Status: LEGALLY SEPARATED     Spouse Name: N/A     Number of Children: N/A   ??? Years of Education: N/A     Occupational History   ??? Not on file.     Social History Main Topics   ??? Smoking status: Former Smoker -- 10 years     Quit date: 07/11/2010   ??? Smokeless tobacco: Never Used   ??? Alcohol Use: No   ??? Drug Use: No   ??? Sexually Active: Yes --  Female partner(s)     Birth Control/ Protection: None     Other Topics Concern   ??? Not on file     Social History Narrative   ??? No narrative on file                  ALLERGIES: Erythromycin; Levaquin; Macrolide antibiotics; Nsaids (non-steroidal anti-inflammatory drug); Pcn; and Ultram      Review of Systems   Constitutional: Negative for fever and chills.   HENT: Negative for neck pain and neck stiffness.    Respiratory: Negative for cough.    Cardiovascular: Negative for chest pain.   Gastrointestinal: Negative for abdominal pain.   Genitourinary: Negative for hematuria, flank pain, difficulty urinating and pelvic pain.   Musculoskeletal: Positive for back pain and gait problem.   Neurological: Negative for dizziness.   All other systems reviewed and are negative.        Filed Vitals:    01/11/12 1118   BP: 152/71  Pulse: 85   Temp: 97.7 ??F (36.5 ??C)   Resp: 18   Height: 5' 5.5" (1.664 m)   Weight: 111.131 kg (245 lb)   SpO2: 100%            Physical Exam   Nursing note and vitals reviewed.  Constitutional: She is oriented to person, place, and time. She appears well-developed. No distress.   HENT:   Head: Normocephalic and atraumatic.   Eyes: Conjunctivae are normal. Pupils are equal, round, and reactive to light.   Neck: Normal range of motion.   Cardiovascular: Exam reveals no gallop and no friction rub.    No murmur heard.  Pulmonary/Chest: Effort normal and breath sounds normal.   Abdominal: Soft. Bowel sounds are normal. She exhibits no distension and no mass. There is no tenderness. There is no rebound and no guarding.   Musculoskeletal: She exhibits tenderness. She exhibits no edema.        Lumbar back: She exhibits tenderness, pain and spasm. She exhibits normal range of motion, no bony tenderness and no swelling.        Back:         Patient has worse pain when she attempts to stand from sitting. She has a negative straight leg raise test although she states her pain is worse with lying flat. She is able  to stand on her toes. She walks without ataxia but does have an antalgic gait. Reflexes are symmetrical. There is no gross weakness noted.   Neurological: She is alert and oriented to person, place, and time. She has normal reflexes. She displays normal reflexes. No cranial nerve deficit. She exhibits normal muscle tone. Coordination normal.   Skin: Skin is warm and dry.        MDM     Differential Diagnosis; Clinical Impression; Plan:     Diagnoses diagnoses-lumbar strain, lumbar disc disease, spinal cord syndrome, lumbar radiculopathy, or aneurysm, UTI number for GI pain. Clinical impression/plan-patient presents with very reproducible lower lumbar pain that is midline. Chest no fever chills or urinary symptoms. Her abdomen is soft. She has no signs of cord compromise. Suspicion is possible lumbar disc disease. She has had similar episodes and surgery in the past. She denies any history of IV drug abuse. She is afebrile. Plan will be an x-ray. She will be treated with analgesics, steroids and continued anti-spasmodics. She will be given followup to orthopedic spine surgeon.  Amount and/or Complexity of Data Reviewed:   Discussion of test results with the performing providers:  No   Decide to obtain previous medical records or to obtain history from someone other than the patient:  Yes   Obtain history from someone other than the patient:  No   Review and summarize past medical records:  Yes   Discuss the patient with another provider:  No   Independant visualization of image, tracing, or specimen:  Yes  Risk of Significant Complications, Morbidity, and/or Mortality:   Presenting problems:  Moderate  Diagnostic procedures:  Low  Management options:  Low  Progress:   Patient progress:  Stable      Procedures

## 2012-01-16 NOTE — Progress Notes (Signed)
Name:  Jody [name]    DOB:  [birth year]    PCP:   Maryelizabeth Kaufmann, MD   MRN:   [id number]  Referring:  As above    Chief Complaint:   Chief Complaint   Patient presents with   ??? Loss of Consciousness       HISTORY OF PRESENT ILLNESS:     This is a 41 y.o. female who presents for evaluation of passing out spells.     She reports that about 2-3 months ago, she was at home, lying in bed, reading, had gotten up, blacked out and fell into the wall, alerted, then hit the wall again, walked into living room, recalls falling to floor and hitting her head, remained awake. She had 2 ER visits for syncope which was felt to be secondary to anemia.  She developed dysfunctional uterine bleeding/ anemia around the time her syncopal events started.  She has plans to see Ob/ Gyn tomorrow to discuss pre-op planning for surgery (? Fibroids).      No hx of childhood seizures, no hx of major head injury/ loss of consciousness, no hx of meningitis/ encephalitis.    + FHx seizure: mother around age 19, dad around 31s    CT Head: 12-03-11: reviewed; normal study    10 Point Review of Systems: reviewed on intake paperwork; refer to media section      Allergies   Allergen Reactions   ??? Erythromycin Nausea and Vomiting and Swelling   ??? Levaquin (Levofloxacin) Rash and Swelling   ??? Macrolide Antibiotics Rash, Nausea and Vomiting and Swelling   ??? Nsaids (Non-Steroidal Anti-Inflammatory Drug) Other (comments)     Per pt, was told by GI d/t stomach ulcers   ??? Pcn (Penicillins) Nausea and Vomiting and Swelling   ??? Ultram (Tramadol) Nausea Only     Current Outpatient Prescriptions on File Prior to Visit   Medication Sig   ??? diazepam (VALIUM) 5 mg tablet Take 1 Tab by mouth every six (6) hours as needed (back spasm).   ??? oxyCODONE-acetaminophen (PERCOCET) 7.5-325 mg per tablet Take 1 Tab by mouth every four (4) hours as needed for Pain.   ??? promethazine (PHENERGAN) 25 mg tablet Take 1 Tab by mouth every six (6) hours as needed for Nausea.   ??? metFORMIN  (GLUCOPHAGE) 1,000 mg tablet Take 1 Tab by mouth two (2) times daily (with meals).   ??? glipiZIDE (GLUCOTROL) 5 mg tablet Take 1 Tab by mouth two (2) times a day.   ??? methylPREDNISolone (MEDROL, PAK,) 4 mg tablet Per dose pack instructions     No current facility-administered medications on file prior to visit.     Past Medical History   Diagnosis Date   ??? Diabetes    ??? Asthma    ??? Endometrial thickening on ultra sound    ??? Diabetic neuropathy      Per pt diagnosed by a physician a [facility]   ??? Lumbar disc disease      Past Surgical History   Procedure Laterality Date   ??? Hx orthopaedic       back   ??? Hx appendectomy     ??? Hx tubal ligation       tubal     Family History   Problem Relation Age of Onset   ??? Seizures Mother    ??? Seizures Father      History     Social History   ??? Marital Status: LEGALLY SEPARATED  Spouse Name: N/A     Number of Children: N/A   ??? Years of Education: N/A     Occupational History   ??? Not on file.     Social History Main Topics   ??? Smoking status: Former Smoker -- 10 years     Quit date: 07/11/2010   ??? Smokeless tobacco: Never Used   ??? Alcohol Use: No   ??? Drug Use: No   ??? Sexually Active: Yes -- Female partner(s)     Birth Control/ Protection: None     Other Topics Concern   ??? Not on file     Social History Narrative   ??? No narrative on file       PHYSICAL EXAM  Blood pressure 132/74, pulse 72, temperature 98.2 ??F (36.8 ??C), temperature source Oral, resp. rate 20, height 5' 5.5" (1.664 m), weight 111.131 kg (245 lb), last menstrual period 09/11/2011, SpO2 98.00%.    General: Well-nourished, no acute distress  Neck: supple  Lungs: clear to auscultation  CV: regular rhythm, regular rate  Extremities: no edema       Neurological Exam     Mental status: Alert, oriented to person, place, situation.    Language: normal fluency and comprehension; no dysarthria.      CNs: [CN 1 smell, CN 9 sensory component not tested]    Pupils (CN 2): equal, round, reactive to light and  accomodation, no APD.    Fundoscopic: exam benign.   Visual fields: normal visual fields in all 4 quadrants bilaterally.    EOMs (CN 3, 4, 6):  No nystagmus, no INO, normal saccades/ pursuit.    Facial sensation (CN 5):  intact in V1, V2, V3 distribution bilaterally.    Facial strength (CN 7): normal, no asymmetry.    Hearing (CN 8): intact to casual conversation.   Palate/ Gag reflex (CN 10, 9): deferred; not pertinent   Shoulder shrug (CN 11): normal symmetric shoulder shrug  Tongue (CN 12): protrudes midline, no atrophy, no fasciculations.      Sensory: intact light touch in all 4 exts  Motor: Normal bulk, tone, and strength in all 4 extremities.  No cog-wheeling, spasticity.  Reflexes: DTRs are symmetric, 2+, no sustained clonus    Plantar response: down-going bilaterally   Coordination: No resting or intention tremor.   Gait: normal observed gait    Romberg: not tested  -----------------------------  Prior clinic notes reviewed    ASSESSMENT AND PLAN  1. Syncope    2. Anemia        Syncope most likely related to dysfunctional uterine bleeding/ anemia  Will check EEG, Carotid doppler and TTE to complete evaluation, rule out other causes  No need to be on anti-epileptic drug for these events  Ob/ Gyn to address anemia/ bleeding issues in the near future.   Patient to follow up after studies to go over results.       Thank you for this referral. Please feel free to contact me if you have any questions.    Sincerely,      Oris Drone, MD

## 2012-01-23 NOTE — Telephone Encounter (Signed)
Pt states she called to get her MRI  Scheduled and was told there was no order for one but she has the paper in her hand. Needs a call back about this.

## 2012-01-23 NOTE — Telephone Encounter (Signed)
Called and spoke to patient and she understands about no mri at this time  She was asking about having test done at [facility] when she is having another test done  Got disconnected   Called and spoke to derek and he states she could have the echo scheduled at [facility] if patient wants  Attempted to call patient unsuccessful message left on voicemail to call and get scheduled with Francee Piccolo he will be the one to schedule these test  Left his number [phone]

## 2012-01-28 NOTE — Progress Notes (Unsigned)
EEG completed

## 2012-01-28 NOTE — Progress Notes (Signed)
Vascular Lab Preliminary report ofcerbrovascular exam reveals minimal plaque rt and lt cervical carotid system 1-15%  Antegrade flow rt and lt vert arteries  Final report to follow  Wille Celeste RVT RDCS

## 2012-01-29 NOTE — Progress Notes (Signed)
Gynecology History and Physical    Name: Jody [name] MRN: [id number] SSN: [id number]    Date of Birth: 06-03-1970  Age: 41 y.o.  Sex: female       Subjective:      Chief complaint:  Submucosal fibroid    Jilliane is a 41 y.o. Caucasian female with a history of fibroids noted on ultrasound consistent with submucosal fibroid. Previous treatment measures included hormone therapy. She is admitted for Hysteroscopy with resection of submucosal fibroid    The current method of family planning is tubal ligation.    OB History    Grav Para Term Preterm Abortions TAB SAB Ect Mult Living    5 5 5       5         Past Medical History   Diagnosis Date   ??? Diabetes    ??? Asthma    ??? Endometrial thickening on ultra sound    ??? Diabetic neuropathy      Per pt diagnosed by a physician a [facility]   ??? Lumbar disc disease      Past Surgical History   Procedure Laterality Date   ??? Hx orthopaedic       back   ??? Hx appendectomy     ??? Hx tubal ligation  1994     Social History     Occupational History   ??? Not on file.     Social History Main Topics   ??? Smoking status: Former Smoker -- 10 years     Quit date: 07/11/2010   ??? Smokeless tobacco: Never Used   ??? Alcohol Use: No   ??? Drug Use: No   ??? Sexually Active: Yes -- Female partner(s)     Birth Control/ Protection: Surgical      Comment: BTL 19 years ago     Family History   Problem Relation Age of Onset   ??? Seizures Mother    ??? Seizures Father    ??? Diabetes Mother    ??? Asthma Mother    ??? High Cholesterol Mother    ??? Heart Attack Mother    ??? Hypertension Father    ??? Cancer Father      throat cancer   ??? Heart Attack Father    ??? Hypertension Sister    ??? Hypertension Paternal Grandmother    ??? Diabetes Paternal Grandmother    ??? Breast Cancer Paternal Grandmother    ??? Other Paternal Grandfather      leukemia        Allergies   Allergen Reactions   ??? Erythromycin Nausea and Vomiting and Swelling   ??? Levaquin (Levofloxacin) Rash and Swelling   ??? Macrolide Antibiotics Rash, Nausea and Vomiting  and Swelling   ??? Nsaids (Non-Steroidal Anti-Inflammatory Drug) Other (comments)     Per pt, was told by GI d/t stomach ulcers   ??? Pcn (Penicillins) Nausea and Vomiting and Swelling   ??? Ultram (Tramadol) Nausea Only     Prior to Admission medications    Medication Sig Start Date End Date Taking? Authorizing Provider   ferrous sulfate (IRON) 325 mg (65 mg iron) tablet Take  by mouth Daily (before breakfast).   Yes Historical Provider   diazepam (VALIUM) 5 mg tablet Take 1 Tab by mouth every six (6) hours as needed (back spasm). 01/11/12  Yes Jule Ser, MD   promethazine (PHENERGAN) 25 mg tablet Take 1 Tab by mouth every six (6) hours as needed for  Nausea. 12/20/11  Yes Kandis Mannan, MD   metFORMIN (GLUCOPHAGE) 1,000 mg tablet Take 1 Tab by mouth two (2) times daily (with meals). 12/07/11  Yes Maryelizabeth Kaufmann, MD   glipiZIDE (GLUCOTROL) 5 mg tablet Take 1 Tab by mouth two (2) times a day. 12/07/11  Yes Maryelizabeth Kaufmann, MD   methylPREDNISolone (MEDROL, PAK,) 4 mg tablet Per dose pack instructions 01/11/12   Jule Ser, MD   oxyCODONE-acetaminophen (PERCOCET) 7.5-325 mg per tablet Take 1 Tab by mouth every four (4) hours as needed for Pain. 01/11/12   Jule Ser, MD        Review of Systems:  Constitutional: positive for obesity, negative for fevers and chills  Respiratory: negative for cough or wheezing  Cardiovascular: positive for none, negative for chest pain, dyspnea, orthopnea, paroxysmal nocturnal dyspnea  Gastrointestinal: positive for blood in stool with work up schedule for Nov 5, negative for nausea, vomiting, diarrhea and constipation  Genitourinary:positive for abnormal menstrual periods, negative for frequency, dysuria, nocturia and vaginal discharge  Endocrine: positive for Diabetes, negative for temperature intolerance     Objective:     Filed Vitals:    01/29/12 1357   BP: 141/99   Pulse: 84   Height: 5\' 5"  (1.651 m)   Weight: 255 lb (115.667 kg)       Physical Exam:  Patient without  distress.  Breast: normal breast exam  Heart: Regular rate and rhythm or S1S2 present  Lung: clear to auscultation throughout lung fields, no wheezes, no rales, no rhonchi and normal respiratory effort  Back: costovertebral angle tenderness absent  Abdomen: soft, nontender, protuberant  External Genitalia: normal general appearance  Rectal: deferred    Assessment:     Submucosal fibroid    Plan:     Hysteroscopy with resection of submucosal fibroid.    Discussed the risks of surgery including the risks of bleeding, infection, deep vein thrombosis, and surgical injuries to internal organs including but not limited to the bowels, bladder, rectum, and female reproductive organs. The patient understands the risks; any and all questions were answered to the patient's satisfaction.    Signed By:  Eyvonne Mechanic, MD     January 29, 2012

## 2012-01-29 NOTE — H&P (View-Only) (Signed)
Gynecology History and Physical    Name: Jody [name] MRN: [id number] SSN: [id number]    Date of Birth: 09/17/1970  Age: 41 y.o.  Sex: female       Subjective:      Chief complaint:  Submucosal fibroid    Jody [name] is a 41 y.o. Caucasian female with a history of fibroids noted on ultrasound consistent with submucosal fibroid. Previous treatment measures included hormone therapy. She is admitted for Hysteroscopy with resection of submucosal fibroid    The current method of family planning is tubal ligation.    OB History    Grav Para Term Preterm Abortions TAB SAB Ect Mult Living    5 5 5       5        Past Medical History   Diagnosis Date   ??? Diabetes    ??? Asthma    ??? Endometrial thickening on ultra sound    ??? Diabetic neuropathy      Per pt diagnosed by a physician a [facility]   ??? Lumbar disc disease      Past Surgical History   Procedure Laterality Date   ??? Hx orthopaedic       back   ??? Hx appendectomy     ??? Hx tubal ligation  1994     Social History     Occupational History   ??? Not on file.     Social History Main Topics   ??? Smoking status: Former Smoker -- 10 years     Quit date: 07/11/2010   ??? Smokeless tobacco: Never Used   ??? Alcohol Use: No   ??? Drug Use: No   ??? Sexually Active: Yes -- Female partner(s)     Birth Control/ Protection: Surgical      Comment: BTL 19 years ago     Family History   Problem Relation Age of Onset   ??? Seizures Mother    ??? Seizures Father    ??? Diabetes Mother    ??? Asthma Mother    ??? High Cholesterol Mother    ??? Heart Attack Mother    ??? Hypertension Father    ??? Cancer Father      throat cancer   ??? Heart Attack Father    ??? Hypertension Sister    ??? Hypertension Paternal Grandmother    ??? Diabetes Paternal Grandmother    ??? Breast Cancer Paternal Grandmother    ??? Other Paternal Grandfather      leukemia        Allergies   Allergen Reactions   ??? Erythromycin Nausea and Vomiting and Swelling   ??? Levaquin (Levofloxacin) Rash and Swelling   ??? Macrolide Antibiotics Rash, Nausea and Vomiting  and Swelling   ??? Nsaids (Non-Steroidal Anti-Inflammatory Drug) Other (comments)     Per pt, was told by GI d/t stomach ulcers   ??? Pcn (Penicillins) Nausea and Vomiting and Swelling   ??? Ultram (Tramadol) Nausea Only     Prior to Admission medications    Medication Sig Start Date End Date Taking? Authorizing Provider   ferrous sulfate (IRON) 325 mg (65 mg iron) tablet Take  by mouth Daily (before breakfast).   Yes Historical Provider   diazepam (VALIUM) 5 mg tablet Take 1 Tab by mouth every six (6) hours as needed (back spasm). 01/11/12  Yes Dwayne E Stratton, MD   promethazine (PHENERGAN) 25 mg tablet Take 1 Tab by mouth every six (6) hours as needed for   Nausea. 12/20/11  Yes Rupen S Amin, MD   metFORMIN (GLUCOPHAGE) 1,000 mg tablet Take 1 Tab by mouth two (2) times daily (with meals). 12/07/11  Yes Lachelle Campbell, MD   glipiZIDE (GLUCOTROL) 5 mg tablet Take 1 Tab by mouth two (2) times a day. 12/07/11  Yes Lachelle Campbell, MD   methylPREDNISolone (MEDROL, PAK,) 4 mg tablet Per dose pack instructions 01/11/12   Dwayne E Stratton, MD   oxyCODONE-acetaminophen (PERCOCET) 7.5-325 mg per tablet Take 1 Tab by mouth every four (4) hours as needed for Pain. 01/11/12   Dwayne E Stratton, MD        Review of Systems:  Constitutional: positive for obesity, negative for fevers and chills  Respiratory: negative for cough or wheezing  Cardiovascular: positive for none, negative for chest pain, dyspnea, orthopnea, paroxysmal nocturnal dyspnea  Gastrointestinal: positive for blood in stool with work up schedule for Nov 5, negative for nausea, vomiting, diarrhea and constipation  Genitourinary:positive for abnormal menstrual periods, negative for frequency, dysuria, nocturia and vaginal discharge  Endocrine: positive for Diabetes, negative for temperature intolerance     Objective:     Filed Vitals:    01/29/12 1357   BP: 141/99   Pulse: 84   Height: 5' 5" (1.651 m)   Weight: 255 lb (115.667 kg)       Physical Exam:  Patient without  distress.  Breast: normal breast exam  Heart: Regular rate and rhythm or S1S2 present  Lung: clear to auscultation throughout lung fields, no wheezes, no rales, no rhonchi and normal respiratory effort  Back: costovertebral angle tenderness absent  Abdomen: soft, nontender, protuberant  External Genitalia: normal general appearance  Rectal: deferred    Assessment:     Submucosal fibroid    Plan:     Hysteroscopy with resection of submucosal fibroid.    Discussed the risks of surgery including the risks of bleeding, infection, deep vein thrombosis, and surgical injuries to internal organs including but not limited to the bowels, bladder, rectum, and female reproductive organs. The patient understands the risks; any and all questions were answered to the patient's satisfaction.    Signed By:  Venezia Sargeant A Genavieve Mangiapane, MD     January 29, 2012

## 2012-01-31 NOTE — Procedures (Signed)
Carotid Doppler Report [facility]     Date of Service: 01/28/2012   Referring: Dr. Gerri Lins  Indication: Syncope    B mode imaging reveals no significant plaquing in the carotid systems bilaterally   Velocities demonstrate no significant increases   Vertebral artery flow anterograde bilaterally     Impression   Normal study  No significant stenosis   Raw images available on PACS system

## 2012-01-31 NOTE — Procedures (Signed)
Carotid Doppler Report [facility]     Date of Service: 01/28/2012   Referring: Dr. Asghar  Indication: Syncope    B mode imaging reveals no significant plaquing in the carotid systems bilaterally   Velocities demonstrate no significant increases   Vertebral artery flow anterograde bilaterally     Impression   Normal study  No significant stenosis   Raw images available on PACS system

## 2012-02-01 NOTE — Telephone Encounter (Signed)
Pt states that she is experiencing a lot of pain on L side and wants to know if it's from her fibroids; pt is scheduled to have surgery on Wednesday 02/06/12.  Please call patient

## 2012-02-05 ENCOUNTER — Inpatient Hospital Stay: Payer: Charity

## 2012-02-05 LAB — POC CHEM8
Anion gap (POC): 14 mmol/L (ref 5–15)
BUN (POC): 13 MG/DL (ref 9–20)
CO2 (POC): 28 MMOL/L (ref 21–32)
Calcium, ionized (POC): 1.21 MMOL/L (ref 1.12–1.32)
Chloride (POC): 104 MMOL/L (ref 98–107)
Creatinine (POC): 0.8 MG/DL (ref 0.6–1.3)
GFRAA, POC: >60 mL/min/{1.73_m2} (ref >60)
GFRNA, POC: >60 mL/min/{1.73_m2} (ref >60)
Glucose (POC): 173 MG/DL — ABNORMAL HIGH (ref 65–105)
Hematocrit (POC): 37 % (ref 35.0–47.0)
Hemoglobin (POC): 12.6 GM/DL (ref 11.5–16.0)
Potassium (POC): 4.6 MMOL/L (ref 3.5–5.1)
Sodium (POC): 140 MMOL/L (ref 136–145)

## 2012-02-05 LAB — EKG, 12 LEAD, INITIAL
Atrial Rate: 83 {beats}/min
Calculated P Axis: 28 degrees
Calculated R Axis: 31 degrees
Calculated T Axis: 35 degrees
Diagnosis: NORMAL
P-R Interval: 148 ms
Q-T Interval: 384 ms
QRS Duration: 82 ms
QTC Calculation (Bezet): 451 ms
Ventricular Rate: 83 {beats}/min

## 2012-02-05 LAB — POC TROPONIN-I: Troponin-I (POC): <0.04 ng/mL (ref 0.00–0.08)

## 2012-02-05 LAB — CK W/ CKMB & INDEX
CK - MB: <0.5 NG/ML — ABNORMAL LOW (ref 0.5–3.6)
CK: 29 U/L (ref 26–192)

## 2012-02-05 LAB — GLUCOSE, POC: Glucose (POC): 166 mg/dL — ABNORMAL HIGH (ref 65–105)

## 2012-02-05 MED ORDER — MORPHINE 2 MG/ML INJECTION
2 mg/mL | INTRAMUSCULAR | Status: AC
Start: 2012-02-05 — End: 2012-02-05
  Administered 2012-02-05: 21:00:00 via INTRAVENOUS

## 2012-02-05 MED ADMIN — fentaNYL citrate (PF) injection 25-100 mcg: INTRAVENOUS | @ 14:00:00 | NDC 00641602501

## 2012-02-05 MED ADMIN — midazolam (VERSED) injection 1-10 mg: INTRAVENOUS | @ 14:00:00 | NDC 10019002837

## 2012-02-05 MED ADMIN — pantoprazole (PROTONIX) injection 40 mg: INTRAVENOUS | @ 21:00:00 | NDC 63323018610

## 2012-02-05 MED ADMIN — 0.9% sodium chloride infusion: INTRAVENOUS | @ 14:00:00 | NDC 00409798348

## 2012-02-05 MED ADMIN — midazolam (VERSED) injection 1-10 mg: INTRAVENOUS | @ 13:00:00 | NDC 10019002837

## 2012-02-05 MED FILL — FENTANYL CITRATE (PF) 50 MCG/ML IJ SOLN: 50 mcg/mL | INTRAMUSCULAR | Qty: 5

## 2012-02-05 MED FILL — SODIUM CHLORIDE 0.9 % IV: INTRAVENOUS | Qty: 1000

## 2012-02-05 MED FILL — PROTONIX 40 MG INTRAVENOUS SOLUTION: 40 mg | INTRAVENOUS | Qty: 40

## 2012-02-05 MED FILL — MIDAZOLAM 1 MG/ML IJ SOLN: 1 mg/mL | INTRAMUSCULAR | Qty: 10

## 2012-02-05 MED FILL — MORPHINE 2 MG/ML INJECTION: 2 mg/mL | INTRAMUSCULAR | Qty: 1

## 2012-02-05 NOTE — Procedures (Signed)
Procedures  by Briscoe Deutscher, MD at 02/05/12 [phone]                Author: Briscoe Deutscher, MD  Service: --  Author Type: Physician       Filed: 02/05/12 0850  Date of Service: 02/05/12 0849  Status: Signed          Editor: Briscoe Deutscher, MD (Physician)            Pre-procedure Diagnoses        1. Iron deficiency anemia, unspecified [280.9]        2. Blood in stool [578.1]                           Post-procedure Diagnoses        1. Iron deficiency anemia, unspecified [280.9]        2. Blood in stool [578.1]                           Procedures        1. COLONOSCOPY,DIAGNOSTIC [98119 (CPT??)]                              [facility]   [address]   [place], [place] [address]                   Operator:  Briscoe Deutscher, MD      Referring Provider: Maryelizabeth Kaufmann, MD      Sedation:  Versed 3 mg IV, Fentanyl 50 mcg IV and See EGD note            Prior to the procedure its objectives, risks, consequences and alternatives were discussed with the patient who then elected to proceed. The patient had the opportunity to ask questions and those questions were answered. A physical exam was performed.  The heart, lungs, and mental status were examined prior to the procedure and found to be satisfactory for conscious sedation and for the procedure. Conscious sedation was initiated by the physician. Continuous pulse oximetry and blood pressure monitoring  were used throughout the procedure.       After appropriate analgesia and rectal exam, the colonoscope was passed into the anus and passed to the cecum without difficulty. The prep was good . On slow withdrawal of the scope, the cecum, ascending colon, hepatic flexure, transverse colon, splenic flexure, descending colon, sigmoid and rectum were normal. Retroflexion exam of the rectum was normal.  She tolerated the procedure without complication and I recommend a repeat colonoscopy in  10 years.      Specimen none      Complications: None.        EBL:  None.      Briscoe Deutscher, MD   02/05/2012  8:50 AM

## 2012-02-05 NOTE — Procedures (Signed)
Procedures  by Briscoe Deutscher, MD at 02/05/12 0845                Author: Briscoe Deutscher, MD  Service: --  Author Type: Physician       Filed: 02/05/12 0848  Date of Service: 02/05/12 0845  Status: Signed          Editor: Briscoe Deutscher, MD (Physician)            Pre-procedure Diagnoses        1. Iron deficiency anemia, unspecified [280.9]        2. Blood in stool [578.1]                           Post-procedure Diagnoses        1. Iron deficiency anemia, unspecified [280.9]        2. Blood in stool [578.1]                           Procedures        1. UPPER GI ENDOSCOPY,BIOPSY [[address] (CPT??)]                              [facility]   [address]   [place], [place] [address]                    Operator: Briscoe Deutscher, MD      Referring Provider: Maryelizabeth Kaufmann, MD      Sedation:  Versed 7 mg IV and Fentanyl 150 mcg. IV         Prior to the procedure its objectives, risks, consequences and alternatives were discussed with the patient who then elected to proceed. The patient had the opportunity to ask questions and those questions were answered. A physical exam was performed.  The heart, lungs, and mental status were examined prior to the procedure and found to be satisfactory for conscious sedation and for the procedure. Conscious sedation was initiated by the physician. Continuous pulse oximetry and blood pressure monitoring  were used throughout the procedure.       After appropriate pharyngeal anesthesia, the endoscope was passed into the esophagus without difficulty. The proximal esophagus is normal as is the distal esophagus. The scope was passed into the stomach without difficulty. The fundus, body, antrum, pylorus,  bulb and postbulbar area are unremarkable.Biopsies were obtained from the second and third portion of duodenum to rule out celiac. On slow  withdrawal of the scope, no abnormality was noted. Retroflexion revealed normal cardia and fundus. She tolerated the  procedure without  complications and will be discharged later today.      Specimen: Biopsies of the duodenum      Complications: None.       EBL:  None.      Briscoe Deutscher, MD   02/05/2012  8:46 AM

## 2012-02-05 NOTE — ED Notes (Signed)
Pt given water & peanut butter crackers per request.

## 2012-02-05 NOTE — Progress Notes (Signed)
Called patient's driver and left a message on voice mail requesting him to pick patient up in 30 minutes.

## 2012-02-05 NOTE — ED Provider Notes (Addendum)
HPI Comments: 41 y.o.female with PMH significant for diabetes, GERD, hypercholesterolemia, tubal ligation, and asthma presents with epigastric pressure.  The pt reports having a EGD and colonoscopy today and was discharged home.  She reports eating lunch this afternoon and 15-20 minutes after eating she had a sudden onset of epigstric pains worse with taking a deep breath.   + flatus.  Denies fever, vomiting, melena, hematochezia, urinary symptoms or any other acute medical conditions.  Maryelizabeth Kaufmann, MD PCP.  Note written by Eliezer Champagne, Scribe, as dictated by Vella Kohler, MD 3:00 PM     Note: The pt reports that a couple pieces of her stomach had to be removed "due to 3 spots that were found and they had to biopsy them."  She also reports she is scheduled to have a [facility] tomorrow.     The history is provided by the patient.        Past Medical History   Diagnosis Date   ??? Diabetes    ??? Asthma    ??? Endometrial thickening on ultra sound    ??? Diabetic neuropathy      Per pt diagnosed by a physician a [facility]   ??? Lumbar disc disease         Past Surgical History   Procedure Laterality Date   ??? Hx orthopaedic       back   ??? Hx appendectomy     ??? Hx tubal ligation  1994         Family History   Problem Relation Age of Onset   ??? Seizures Mother    ??? Seizures Father    ??? Diabetes Mother    ??? Asthma Mother    ??? High Cholesterol Mother    ??? Heart Attack Mother    ??? Hypertension Father    ??? Cancer Father      throat cancer   ??? Heart Attack Father    ??? Hypertension Sister    ??? Hypertension Paternal Grandmother    ??? Diabetes Paternal Grandmother    ??? Breast Cancer Paternal Grandmother    ??? Other Paternal Grandfather      leukemia        History     Social History   ??? Marital Status: LEGALLY SEPARATED     Spouse Name: N/A     Number of Children: N/A   ??? Years of Education: N/A     Occupational History   ??? Not on file.     Social History Main Topics   ??? Smoking status: Former Smoker -- 10 years      Quit date: 07/11/2010   ??? Smokeless tobacco: Never Used   ??? Alcohol Use: No   ??? Drug Use: No   ??? Sexually Active: Yes -- Female partner(s)     Birth Control/ Protection: Surgical      Comment: BTL 19 years ago     Other Topics Concern   ??? Not on file     Social History Narrative   ??? No narrative on file                  ALLERGIES: Erythromycin; Levaquin; Macrolide antibiotics; Nsaids (non-steroidal anti-inflammatory drug); Pcn; and Ultram      Review of Systems   Constitutional: Negative for fever and appetite change.   HENT: Negative for congestion and rhinorrhea.    Eyes: Negative for visual disturbance.   Respiratory: Negative for cough and shortness  of breath.    Cardiovascular: Negative for chest pain.   Gastrointestinal: Positive for abdominal pain. Negative for nausea, vomiting and diarrhea.   Genitourinary: Negative for dysuria and hematuria.   Musculoskeletal: Negative for back pain.   Skin: Negative for rash.   Neurological: Positive for dizziness. Negative for syncope, light-headedness and headaches.   Psychiatric/Behavioral: Negative for confusion.   All other systems reviewed and are negative.        Filed Vitals:    02/05/12 1300   BP: 141/83   Temp: 98.4 ??F (36.9 ??C)   Resp: 16   Height: 5\' 5"  (1.651 m)   Weight: 111.131 kg (245 lb)   SpO2: 97%            Physical Exam   Nursing note and vitals reviewed.  Constitutional: She is oriented to person, place, and time. She appears well-developed and well-nourished.   HENT:   Head: Normocephalic and atraumatic.   Mouth/Throat: Oropharynx is clear and moist. No oropharyngeal exudate.   Eyes: Conjunctivae are normal. Pupils are equal, round, and reactive to light. Right eye exhibits no discharge. Left eye exhibits no discharge. No scleral icterus.   Neck: Normal range of motion. Neck supple.   Cardiovascular: Normal rate, regular rhythm and normal heart sounds.  Exam reveals no gallop and no friction rub.    No murmur heard.  Pulmonary/Chest: Effort normal  and breath sounds normal. No respiratory distress. She has no wheezes. She has no rales.   Abdominal: Soft. Bowel sounds are normal. She exhibits no distension. There is tenderness (very minimal epigastric tenderness). There is no guarding.   Musculoskeletal: Normal range of motion. She exhibits no edema and no tenderness.   Lymphadenopathy:     She has no cervical adenopathy.   Neurological: She is alert and oriented to person, place, and time. No cranial nerve deficit. Coordination normal.   Skin: Skin is warm and dry. No rash noted. No pallor.        MDM    Procedures    EKG: (ED MD Interpretation)  Normal Sinus Rhythm, rate=83, No ST T wave changes.    Note written by Eliezer Champagne, Scribe, as interpreted by Vella Kohler, MD 3:01 PM      4:24 PM  Vella Kohler, MD spoke with Dr. Christy Gentles, Consult for Gastroenterology. Discussed available diagnostic tests and clinical findings. He/She is in agreement with care plans as outlined. Recommends discharge.  STATES ENDOSCOPIES WERE UNREMARKABLE.      4:24 PM  Patient's results have been reviewed with them.  FEELS BETTER.  CARDIAC ENZYMES AND EKG UNREMARKABLE.  WILL RECOMMEND LIQUID DIET FOR TODAY AND THEN ADVANCE DIET AS TOLERATED.  Patient and/or family have verbally conveyed their understanding and agreement of the patient's signs, symptoms, diagnosis, treatment and prognosis and additionally agree to follow up as recommended or return to the Emergency Room should their condition change prior to follow-up.  Discharge instructions have also been provided to the patient with some educational information regarding their diagnosis as well a list of reasons why they would want to return to the ER prior to their follow-up appointment should their condition change.

## 2012-02-05 NOTE — Telephone Encounter (Signed)
I called the patient back and was able to leave a voice mail. I asked her to give me a call back.

## 2012-02-05 NOTE — ED Notes (Signed)
Discharge instructions given to & reviewed with pt by ER MD. Pt verbalized understanding & did not have any additional questions. Pt exited ER via wheel chair with RN assistance. Pt in no apparent distress upon discharge.

## 2012-02-05 NOTE — Telephone Encounter (Signed)
I tried to call patient back on 02/01/2012 at 4:45 pm but there was no answer and her phone rang out. I tried to call the patient back today (02/05/2012 at 10:21 am) to check on her and her phone rang out without the voice mail picking up.

## 2012-02-05 NOTE — Procedures (Signed)
[facility]  [address]  [place], [place] [address]              Operator:  Briscoe Deutscher, MD    Referring Provider: Maryelizabeth Kaufmann, MD    Sedation:  Versed 3 mg IV, Fentanyl 50 mcg IV and See EGD note        Prior to the procedure its objectives, risks, consequences and alternatives were discussed with the patient who then elected to proceed. The patient had the opportunity to ask questions and those questions were answered. A physical exam was performed. The heart, lungs, and mental status were examined prior to the procedure and found to be satisfactory for conscious sedation and for the procedure. Conscious sedation was initiated by the physician. Continuous pulse oximetry and blood pressure monitoring were used throughout the procedure.     After appropriate analgesia and rectal exam, the colonoscope was passed into the anus and passed to the cecum without difficulty. The prep was good. On slow withdrawal of the scope, the cecum, ascending colon, hepatic flexure, transverse colon, splenic flexure, descending colon, sigmoid and rectum were normal. Retroflexion exam of the rectum was normal. She tolerated the procedure without complication and I recommend a repeat colonoscopy in 10 years.    Specimen none    Complications: None.     EBL:  None.    Briscoe Deutscher, MD  02/05/2012  8:50 AM

## 2012-02-05 NOTE — H&P (Signed)
[facility]  [address]  [place], [place] [address]                 Jody [name] is a  41 y.o. Caucasian female who presents with heme positive stool and iron deficiency anemia.      Past Medical History   Diagnosis Date   ??? Diabetes    ??? Asthma    ??? Endometrial thickening on ultra sound    ??? Diabetic neuropathy      Per pt diagnosed by a physician a [facility]   ??? Lumbar disc disease      Past Surgical History   Procedure Laterality Date   ??? Hx orthopaedic       back   ??? Hx appendectomy     ??? Hx tubal ligation  1994     Allergies   Allergen Reactions   ??? Erythromycin Nausea and Vomiting and Swelling   ??? Levaquin (Levofloxacin) Rash and Swelling   ??? Macrolide Antibiotics Rash, Nausea and Vomiting and Swelling   ??? Nsaids (Non-Steroidal Anti-Inflammatory Drug) Other (comments)     Per pt, was told by GI d/t stomach ulcers   ??? Pcn (Penicillins) Nausea and Vomiting and Swelling   ??? Ultram (Tramadol) Nausea Only     Current Facility-Administered Medications   Medication Dose Route Frequency Provider Last Rate Last Dose   ??? 0.9% sodium chloride infusion  50 mL/hr IntraVENous CONTINUOUS Briscoe Deutscher, MD       ??? sodium chloride (NS) flush 5-10 mL  5-10 mL IntraVENous Q8H Briscoe Deutscher, MD       ??? sodium chloride (NS) flush 5-10 mL  5-10 mL IntraVENous PRN Briscoe Deutscher, MD       ??? midazolam (VERSED) injection 1-10 mg  1-10 mg IntraVENous MULTIPLE DOSE GIVEN Briscoe Deutscher, MD       ??? fentaNYL citrate (PF) injection 25-100 mcg  25-100 mcg IntraVENous Multiple Briscoe Deutscher, MD       ??? naloxone [facility]) injection 0.4 mg  0.4 mg IntraVENous Multiple Briscoe Deutscher, MD       ??? flumazenil (ROMAZICON) 0.1 mg/mL injection 0.2 mg  0.2 mg IntraVENous Multiple Briscoe Deutscher, MD       ??? benzocaine (HURRICANE) 20 % spray   Mucous Membrane PRN Briscoe Deutscher, MD       ??? simethicone (MYLICON) 40mg /0.28mL oral drops 80 mg  1.2 mL Oral Multiple Briscoe Deutscher, MD           Visit Vitals   Item  Reading   ??? BP 147/103   ??? Pulse 67   ??? Resp 12   ??? Ht 5\' 5"  (1.651 m)   ??? Wt 111.131 kg (245 lb)   ??? BMI 40.77 kg/m2   ??? SpO2 100%           PHYSICAL EXAM:  General: WD, WN. Alert, cooperative, no acute distress????  HEENT: NC, Atraumatic.  PERRLA, EOMI. Anicteric sclerae. Mallampati score 2  Lungs:  CTA Bilaterally. No Wheezing/Rhonchi/Rales.  Heart:  Regular  rhythm,?? No murmur (), No Rubs, No Gallops  Abdomen: Soft, Non distended, Non tender. ??+Bowel sounds, no HSM  Extremities: No c/c/e  Neurologic:?? CN 2-12 gi, Alert and oriented X 3.  No acute neurological distress   Psych:???? Good insight.??Not anxious nor agitated.    Plan:   Endoscopic procedure with conscious sedation.    Briscoe Deutscher,  MD  02/05/2012  8:29 AM

## 2012-02-05 NOTE — Procedures (Signed)
 [facility]  [address]  [place], [place] [address]               Operator: Briscoe Deutscher, MD    Referring Provider: Maryelizabeth Kaufmann, MD    Sedation:  Versed 7 mg IV and Fentanyl 150 mcg. IV      Prior to the procedure its objectives, risks, consequences and alternatives were discussed with the patient who then elected to proceed. The patient had the opportunity to ask questions and those questions were answered. A physical exam was performed. The heart, lungs, and mental status were examined prior to the procedure and found to be satisfactory for conscious sedation and for the procedure. Conscious sedation was initiated by the physician. Continuous pulse oximetry and blood pressure monitoring were used throughout the procedure.     After appropriate pharyngeal anesthesia, the endoscope was passed into the esophagus without difficulty. The proximal esophagus is normal as is the distal esophagus. The scope was passed into the stomach without difficulty. The fundus, body, antrum, pylorus, bulb and postbulbar area are unremarkable.Biopsies were obtained from the second and third portion of duodenum to rule out celiac. On slow withdrawal of the scope, no abnormality was noted. Retroflexion revealed normal cardia and fundus. She tolerated the procedure without complications and will be discharged later today.    Specimen: Biopsies of the duodenum    Complications: None.     EBL:  None.    Briscoe Deutscher, MD  02/05/2012  8:46 AM

## 2012-02-05 NOTE — Progress Notes (Signed)
Patient's driver returned the call and said he would not have a break from work until 10:30 and would pick her up then.

## 2012-02-05 NOTE — ED Notes (Signed)
Pt resting in bed in no apparent distress. Call bell within reach. Bed in low, locked position.

## 2012-02-05 NOTE — ED Notes (Signed)
Triage note: Pt reports having EGD today and after she went and ate and now has tremendous epigastric pain. Pain is worse with inhalation. Pt is passing flatus.

## 2012-02-06 ENCOUNTER — Inpatient Hospital Stay: Payer: Charity

## 2012-02-06 LAB — GLUCOSE, POC
Glucose (POC): 119 mg/dL — ABNORMAL HIGH (ref 65–105)
Glucose (POC): 145 mg/dL — ABNORMAL HIGH (ref 65–105)

## 2012-02-06 MED ADMIN — HYDROmorphone (PF) (DILAUDID) injection 0.25-1 mg: INTRAVENOUS | @ 22:00:00 | NDC 00409255201

## 2012-02-06 MED ADMIN — ceFAZolin in 0.9% NS (ANCEF) IVPB Soln 2 g: INTRAVENOUS | @ 20:00:00 | NDC 09999966850

## 2012-02-06 MED ADMIN — lactated ringers infusion: INTRAVENOUS | @ 18:00:00 | NDC 00409795309

## 2012-02-06 MED ADMIN — HYDROmorphone (PF) (DILAUDID) injection 0.25-1 mg: INTRAVENOUS | @ 21:00:00 | NDC 00409255201

## 2012-02-06 MED FILL — MIDAZOLAM 1 MG/ML IJ SOLN: 1 mg/mL | INTRAMUSCULAR | Qty: 5

## 2012-02-06 MED FILL — LACTATED RINGERS IV: INTRAVENOUS | Qty: 1000

## 2012-02-06 MED FILL — HYDROMORPHONE (PF) 1 MG/ML IJ SOLN: 1 mg/mL | INTRAMUSCULAR | Qty: 1

## 2012-02-06 MED FILL — FENTANYL CITRATE (PF) 50 MCG/ML IJ SOLN: 50 mcg/mL | INTRAMUSCULAR | Qty: 5

## 2012-02-06 MED FILL — LACTATED RINGERS IV: INTRAVENOUS | Qty: 500

## 2012-02-06 MED FILL — CEFAZOLIN 2 GRAM/50 ML NS IVPB: INTRAVENOUS | Qty: 50

## 2012-02-06 NOTE — Op Note (Signed)
Operative Note    Preoperative Diagnosis: THICKENED ENDOMETRIUM, FIBROIDS    Postoperative Diagnosis:  THICKENED ENDOMETRIUM, FIBROIDS    Surgeon: Eyvonne Mechanic, MD    Assistants: Surgeon(s):  Eyvonne Mechanic, MD    Anesthesia:  General     Indications: The patient was admitted to the hospital with ultrasound findings of mass in endometrium.   The patient now presents for hysteroscopy and excision of mass after discussing therapeutic alternatives.         Procedure in Detail:       After informed consent patient was taken to Main OR #5 at [facility], where she was transferred to the OR table in the supine position.  After induction of General anesthesia the patient was moved to a  dorsal lithotomy position using yellow fin stirups.  She was prepped and draped for a vaginal approach.    Using the vaginal approach, the 5mm  hysteroscopy was introduced thru an undilated cervix with normal of multiple endometrial polyps.  The [employer] was used to remove polypoid tissue followed by fractional D&C.  Specimens were sent for permanent pathology.    The patient was awakening from general anesthesia and taken to the recovery room in stable condition.       Findings:  Multiple endometrial polyps    Estimated Blood Loss:  scant    Fluid deficit:  500 mL           Drains: Straight cath                Specimens:   ID Type Source Tests Collected by Time Destination   1 : endometrial currettings Preservative Uterus  Eyvonne Mechanic, MD 02/06/2012 1548 Pathology               Implants: * No implants in log *           Complications:  none           Disposition: PACU - hemodynamically stable.           Condition: stable    Signed By: Eyvonne Mechanic, MD                         February 06, 2012

## 2012-02-06 NOTE — Progress Notes (Signed)
Post-Anesthesia Evaluation & Assessment    Visit Vitals   Item Reading   ??? BP 127/55   ??? Pulse 63   ??? Temp 98 ??F (36.7 ??C)   ??? Resp 17   ??? Ht 5\' 5"  (1.651 m)   ??? Wt 113.399 kg (250 lb)   ??? BMI 41.6 kg/m2   ??? SpO2 93%       Nausea/Vomiting: no nausea    Post-operative hydration adequate.    Pain score (VAS): 4    Mental status & Level of consciousness: alert and oriented x 3    Neurological status: moves all extremities, sensation grossly intact    Pulmonary status: airway patent, no supplemental oxygen required    Complications related to anesthesia: none    Patient has met all discharge requirements.    Additional comments:        Robbie Louis, MD

## 2012-02-06 NOTE — Progress Notes (Signed)
Pt discharged stable to home with her husband, who verbalizes understanding of discharge instructions.  Pt able to void and tolerate po intake prior to discharge.

## 2012-02-06 NOTE — Interval H&P Note (Signed)
H&P Update:  Jody [name] was seen and examined.  History and physical has been reviewed. There have been no significant clinical changes since the completion of the originally dated History and Physical.    Signed By: Eyvonne Mechanic, MD     February 06, 2012 2:48 PM

## 2012-02-06 NOTE — Discharge Summary (Signed)
Gynecology Surgical Discharge Summary     Name: Jody [name] MRN: [id number]  SSN: [id number]    Date of Birth: 14-Jul-1970  Age: 41 y.o.  Sex: female      Admit date: 02/06/2012    Discharge Date: 02/06/2012      Attending Physician: Eyvonne Mechanic, MD     Admission Diagnoses: Abnormal uterine bleeding    Discharge Diagnoses: Principal Problem:    Fibroids, submucosal (07/10/2011)         Procedures: Operative Hysteroscopy    Hospital Course: Normal hospital course for this procedure.    Significant Diagnostic Studies:   Recent Results (from the past 24 hour(s))   GLUCOSE, POC    Collection Time    02/06/12  1:20 PM       Result Value Range    Glucose (POC) 119 (*) 65 - 105 mg/dL    Performed by Alfredo Bach         Condition on Discharge:  stable    Patient Instructions:   Current Discharge Medication List      START taking these medications    Details   HYDROcodone-acetaminophen (NORCO) 5-325 mg per tablet Take 1 Tab by mouth every six (6) hours as needed for Pain.  Qty: 30 Tab, Refills: 1         CONTINUE these medications which have NOT CHANGED    Details   ferrous sulfate (IRON) 325 mg (65 mg iron) tablet Take  by mouth Daily (before breakfast).      metFORMIN (GLUCOPHAGE) 1,000 mg tablet Take 1 Tab by mouth two (2) times daily (with meals).  Qty: 60 Tab, Refills: 1    Associated Diagnoses: Diabetes mellitus      glipiZIDE (GLUCOTROL) 5 mg tablet Take 1 Tab by mouth two (2) times a day.  Qty: 60 Tab, Refills: 0    Associated Diagnoses: Diabetes mellitus            Activity: No sex, douching, or tampons for 6 weeks or as directed by your physician. No heavy lifting for 6 weeks. No driving while taking pain medication.  Diet: Resume pre-hospital diet  Wound Care: None needed    Follow-up Appointments   Procedures   ??? FOLLOW UP VISIT Appointment in: 6 Weeks     Standing Status: Standing      Number of Occurrences: 1      Standing Expiration Date:      Order Specific Question:  Appointment in     Answer:  6 Weeks         Signed By:          Bing Neighbors. Hyacinth Meeker, MD, FACOG    LTC, AUS, Retired    Assistant Clinical Professor, OB/GYN    [facility]    February 06, 2012

## 2012-02-06 NOTE — Op Note (Signed)
Operative Note    Preoperative Diagnosis: THICKENED ENDOMETRIUM, FIBROIDS    Postoperative Diagnosis:  THICKENED ENDOMETRIUM, FIBROIDS    Surgeon: Bernie Fobes A Caliya Narine, MD    Assistants: Surgeon(s):  Karlon Schlafer A Estoria Geary, MD    Anesthesia:  General     Indications: The patient was admitted to the hospital with ultrasound findings of mass in endometrium.   The patient now presents for hysteroscopy and excision of mass after discussing therapeutic alternatives.         Procedure in Detail:       After informed consent patient was taken to Main OR #5 at [facility], where she was transferred to the OR table in the supine position.  After induction of General anesthesia the patient was moved to a  dorsal lithotomy position using yellow fin stirups.  She was prepped and draped for a vaginal approach.    Using the vaginal approach, the 5mm  hysteroscopy was introduced thru an undilated cervix with normal of multiple endometrial polyps.  The [employer] morcellator was used to remove polypoid tissue followed by fractional D&C.  Specimens were sent for permanent pathology.    The patient was awakening from general anesthesia and taken to the recovery room in stable condition.       Findings:  Multiple endometrial polyps    Estimated Blood Loss:  scant    Fluid deficit:  500 mL           Drains: Straight cath                Specimens:   ID Type Source Tests Collected by Time Destination   1 : endometrial currettings Preservative Uterus  Jody [name] A Jody Trostel, MD 02/06/2012 1548 Pathology               Implants: * No implants in log *           Complications:  none           Disposition: PACU - hemodynamically stable.           Condition: stable    Signed By: Jody Sheperd A Miski Feldpausch, MD                         February 06, 2012

## 2012-02-06 NOTE — Other (Signed)
Report received for lunch relief from Nunzio Cobbs RN

## 2012-02-07 MED FILL — DEXAMETHASONE SODIUM PHOSPHATE 10 MG/ML IJ SOLN: 10 mg/mL | INTRAMUSCULAR | Qty: 1

## 2012-02-07 MED FILL — KETOROLAC TROMETHAMINE 30 MG/ML INJECTION: 30 mg/mL (1 mL) | INTRAMUSCULAR | Qty: 1

## 2012-02-07 MED FILL — LIDOCAINE (PF) 20 MG/ML (2 %) IV SYRINGE: 100 mg/5 mL (2 %) | INTRAVENOUS | Qty: 5

## 2012-02-07 MED FILL — LACTATED RINGERS IV: INTRAVENOUS | Qty: 1000

## 2012-02-07 MED FILL — DIPRIVAN 10 MG/ML INTRAVENOUS EMULSION: 10 mg/mL | INTRAVENOUS | Qty: 50

## 2012-02-07 MED FILL — ONDANSETRON (PF) 4 MG/2 ML INJECTION: 4 mg/2 mL | INTRAMUSCULAR | Qty: 2

## 2012-02-08 LAB — CBC WITH AUTOMATED DIFF
ABS. BASOPHILS: 0 10*3/uL (ref 0.0–0.1)
ABS. EOSINOPHILS: 0.2 10*3/uL (ref 0.0–0.4)
ABS. LYMPHOCYTES: 2.8 10*3/uL (ref 0.8–3.5)
ABS. MONOCYTES: 0.3 10*3/uL (ref 0.0–1.0)
ABS. NEUTROPHILS: 3.9 10*3/uL (ref 1.8–8.0)
BASOPHILS: 0 % (ref 0–1)
EOSINOPHILS: 3 % (ref 0–7)
HCT: 37.8 % (ref 35.0–47.0)
HGB: 13.1 g/dL (ref 11.5–16.0)
LYMPHOCYTES: 39 % (ref 12–49)
MCH: 28.1 PG (ref 26.0–34.0)
MCHC: 34.7 g/dL (ref 30.0–36.5)
MCV: 81.1 FL (ref 80.0–99.0)
MONOCYTES: 4 % — ABNORMAL LOW (ref 5–13)
NEUTROPHILS: 54 % (ref 32–75)
PLATELET: 207 10*3/uL (ref 150–400)
RBC: 4.66 M/uL (ref 3.80–5.20)
RDW: 18.6 % — ABNORMAL HIGH (ref 11.5–14.5)
WBC: 7.3 10*3/uL (ref 3.6–11.0)

## 2012-02-08 LAB — URINALYSIS W/ REFLEX CULTURE
Bacteria: NEGATIVE /HPF
Bilirubin: NEGATIVE
Glucose: NEGATIVE MG/DL
Ketone: NEGATIVE MG/DL
Leukocyte Esterase: NEGATIVE
Nitrites: NEGATIVE
Protein: NEGATIVE MG/DL
Specific gravity: 1.016 (ref 1.003–1.030)
Urobilinogen: 0.2 EU/DL (ref 0.2–1.0)
pH (UA): 6 (ref 5.0–8.0)

## 2012-02-08 MED ORDER — SODIUM CHLORIDE 0.9 % IJ SYRG
INTRAMUSCULAR | Status: DC | PRN
Start: 2012-02-08 — End: 2012-02-09

## 2012-02-08 MED ADMIN — sodium chloride 0.9 % bolus infusion 1,000 mL: INTRAVENOUS | @ 23:00:00 | NDC 00409798309

## 2012-02-08 MED ADMIN — morphine injection 2 mg: INTRAVENOUS | @ 23:00:00 | NDC 00409189001

## 2012-02-08 MED ADMIN — sodium chloride (NS) flush 5-10 mL: INTRAVENOUS | @ 23:00:00 | NDC 82903065462

## 2012-02-08 MED ADMIN — ondansetron (ZOFRAN) injection 4 mg: INTRAVENOUS | @ 23:00:00 | NDC 00409475503

## 2012-02-08 MED FILL — MORPHINE 2 MG/ML INJECTION: 2 mg/mL | INTRAMUSCULAR | Qty: 1

## 2012-02-08 MED FILL — ONDANSETRON (PF) 4 MG/2 ML INJECTION: 4 mg/2 mL | INTRAMUSCULAR | Qty: 2

## 2012-02-08 MED FILL — SODIUM CHLORIDE 0.9 % IV: INTRAVENOUS | Qty: 1000

## 2012-02-08 NOTE — ED Notes (Signed)
Patient in ULTRASOUND.

## 2012-02-08 NOTE — ED Notes (Signed)
Patient with complaints of nausea, NP aware, will medicate per orders.

## 2012-02-08 NOTE — ED Notes (Signed)
Oxygen taken off at this time.  Pt is alert and oriented x3, speech is slow.  Discussed plan of care with pt, verbalized understanding.  Monitor x2. Call bell given with comfort measures.  Family member at bedside.

## 2012-02-08 NOTE — ED Notes (Signed)
Vaginal bleeding and pelvic pain post D&C on 02/06/12.

## 2012-02-08 NOTE — ED Provider Notes (Signed)
HPI Comments: Pt had D&C 2 days ago with Dr. Hyacinth Meeker, today she had an increase in pain and bleeding, passing a lot of clots, soaking pads.  Called Dr. Rondel Baton office and was advised to come to the ED and be checked since Dr. Hyacinth Meeker wasn't in the office to evaluate him.      Patient is a 41 y.o. female presenting with vaginal bleeding and pelvic pain. The history is provided by the patient.   Vaginal Bleeding  This is a new problem. The current episode started 2 days ago. The problem occurs constantly. The problem has been gradually worsening. Pertinent negatives include no chest pain, no abdominal pain, no headaches and no shortness of breath. Nothing aggravates the symptoms. Nothing relieves the symptoms. She has tried nothing for the symptoms.   Pelvic Pain   This is a new problem. The current episode started 2 days ago. The problem occurs constantly. The problem has been gradually worsening. The pain is at a severity of 10/10. The pain is severe. Pertinent negatives include no fever, no belching, no diarrhea, no hematochezia, no nausea, no vomiting, no constipation, no dysuria, no frequency, no hematuria, no headaches, no chest pain and no back pain.        Past Medical History   Diagnosis Date   ??? Diabetes    ??? Asthma    ??? Endometrial thickening on ultra sound    ??? Diabetic neuropathy      Per pt diagnosed by a physician a [facility]   ??? Lumbar disc disease    ??? Other ill-defined conditions      high cholesterol   ??? Psychiatric disorder      depression, anxiety        Past Surgical History   Procedure Laterality Date   ??? Hx orthopaedic       back   ??? Hx appendectomy     ??? Hx tubal ligation  1994   ??? Pr hysteroscopy,w/endo bx N/A 02/06/2012     OPERATIVE HYSTEROSCOPY, RESECTION SUBMUCOSSAL FIBROIDS performed by Eyvonne Mechanic, MD at [facility] MAIN OR         Family History   Problem Relation Age of Onset   ??? Seizures Mother    ??? Seizures Father    ??? Diabetes Mother    ??? Asthma Mother    ??? High  Cholesterol Mother    ??? Heart Attack Mother    ??? Hypertension Father    ??? Cancer Father      throat cancer   ??? Heart Attack Father    ??? Hypertension Sister    ??? Hypertension Paternal Grandmother    ??? Diabetes Paternal Grandmother    ??? Breast Cancer Paternal Grandmother    ??? Other Paternal Grandfather      leukemia        History     Social History   ??? Marital Status: LEGALLY SEPARATED     Spouse Name: N/A     Number of Children: N/A   ??? Years of Education: N/A     Occupational History   ??? Not on file.     Social History Main Topics   ??? Smoking status: Former Smoker -- 10 years     Quit date: 07/11/2010   ??? Smokeless tobacco: Never Used   ??? Alcohol Use: No   ??? Drug Use: No   ??? Sexually Active: Yes -- Female partner(s)     Birth Control/ Protection: Surgical  Comment: BTL 19 years ago     Other Topics Concern   ??? Not on file     Social History Narrative   ??? No narrative on file                  ALLERGIES: Erythromycin; Levaquin; Macrolide antibiotics; Nsaids (non-steroidal anti-inflammatory drug); Pcn; and Ultram      Review of Systems   Constitutional: Negative for fever, chills and fatigue.   HENT: Negative for sore throat, trouble swallowing, neck pain and neck stiffness.    Respiratory: Negative for chest tightness, shortness of breath and wheezing.    Cardiovascular: Negative for chest pain, palpitations and leg swelling.   Gastrointestinal: Negative for nausea, vomiting, abdominal pain, diarrhea, constipation and hematochezia.   Genitourinary: Positive for vaginal bleeding, vaginal pain and pelvic pain. Negative for dysuria, frequency, hematuria, flank pain, decreased urine volume, difficulty urinating and dyspareunia.   Musculoskeletal: Negative for back pain, joint swelling and gait problem.   Skin: Negative for pallor, rash and wound.   Neurological: Negative for dizziness, facial asymmetry, light-headedness and headaches.       Filed Vitals:    02/08/12 1734 02/08/12 1929 02/08/12 1941 02/08/12 2107   BP:  137/63  141/82    Pulse: 70  98    Temp: 98.6 ??F (37 ??C)      Resp: 14 16 18 18    Height: 5' 5.5" (1.664 m)      Weight: 113.399 kg (250 lb)      SpO2: 98% 100% 99% 98%            Physical Exam   Nursing note and vitals reviewed.  Constitutional: She is oriented to person, place, and time. She appears well-developed and well-nourished. No distress.   HENT:   Head: Normocephalic and atraumatic.   Eyes: EOM are normal. Pupils are equal, round, and reactive to light.   Neck: Normal range of motion. Neck supple. No thyromegaly present.   Cardiovascular: Normal rate, regular rhythm, normal heart sounds and intact distal pulses.    Pulmonary/Chest: Effort normal and breath sounds normal. No respiratory distress. She has no wheezes. She has no rales. She exhibits no tenderness.   Abdominal: Soft. She exhibits no mass. There is tenderness. There is no rebound and no guarding.   Genitourinary: Pelvic exam was performed with patient supine. No labial fusion. There is no rash, tenderness, lesion or injury on the right labia. There is no rash, tenderness, lesion or injury on the left labia. There is tenderness and bleeding around the vagina. No erythema around the vagina. No foreign body around the vagina. No signs of injury around the vagina. No vaginal discharge found.   External genitalia intact, no lesions, masses or discharge noted.  Bleeding noted.  Speculum exam shows no lesions or discharge, samples taken.  Bleeding noted from os, os appears closed, blood steadily flowing.       Musculoskeletal: Normal range of motion. She exhibits no edema and no tenderness.   Lymphadenopathy:     She has no cervical adenopathy.   Neurological: She is alert and oriented to person, place, and time.   Skin: Skin is warm. She is not diaphoretic.   Psychiatric: She has a normal mood and affect.        MDM     Differential Diagnosis; Clinical Impression; Plan:     Ddx:  Complication post procedure  Infection/endometritis  Post operative  pain  Amount and/or Complexity of Data Reviewed:  Clinical lab tests:  Ordered and reviewed  Tests in the radiology section of CPT??:  Ordered and reviewed  Tests in the medicine section of the CPT??:  Ordered and reviewed  Discussion of test results with the performing providers:  Yes   Obtain history from someone other than the patient:  Yes   Discuss the patient with another provider:  Yes      Procedures    No clue cells, no trich, no yeast.  CMP grossly normal.  Large blood inurine.  CBC wnl.      US TRANSVAGINAL (Final result)  Result time: 02/08/12 19:25:15      Final result by Rad Results In Edi (02/08/12 19:25:15)      Narrative:    **Final Report**      ICD Codes / Adm.Diagnosis: 120015 160446 / Vaginal Bleeding Pelvic Pain  Examination: US TRANSVAGINAL NON OB - [id number] - Feb 08 2012 6:55PM  Accession No: [id number]  Reason:       REPORT:  EXAM: US PELVIC NON OB, US TRANSVAGINAL NON OB    INDICATION: severe pelvic pain, 2 days post DandC, heavy bleeding, passing   clots    COMPARISON: None.    TECHNIQUE:  Real-time sonography of the pelvis was performed using the urine filled   bladder as an acoustic window. Multiple static images of the uterus and   ovaries were obtained. Transvaginal evaluation was also performed to better   evaluate the endometrium.    FINDINGS:    Transabdominal:  The uterus measures 9.4x6x6.6 cm and is homogeneous. The endometrial stripe   is not well evaluated because of an empty the bladder. Neither ovary is   seen, presumably secondary to position.    Transvaginal: The uterus measures 10x5.9x6.9 cm. Within the uterus, there   are 2 fibroids. One is submucosal and measures 2.1x2.3x2.6 cm. One is   anterior intramural measures 1.7x1.3x1.5 cm. The endometrial stripe measures   15 mm. The right ovary measures 3.5x2.8x2.9 cm and the left ovary is not   seen, presumably secondary to position. Tthere is no mass or fluid or other   abnormality in the adnexa or cul-de-sac.      IMPRESSION: The  great uterus. No evidence for retained products of   conception or tissue. Non-visualization of the left ovary.       2040 Spoke to Dr. Evette Cristal, on call for ob/gyn.  He came down to evaluate the patient.  If we can get the patient's pain under control she can be discharged, if not the patient will have to be admitted.  Ordered 2mg  IV dilaudid.      2230 pt still in 9/10 pain, spoke to Dr. Evette Cristal again, pt still in 9/10 pain, he advised 2mg  more of dilaudid.      2325 pt still having pain, spoke to Dr. Evette Cristal, Dr. Clyde Canterbury is on call for tonight, he does not believe the patient meets admission criteria.  Pt can be discharged on oxycodone 10mg  q3 hours and follow up with Dr. Hyacinth Meeker on Monday.      Assessment: vaginal bleeding, abdominal pain  Plan: discharge home, oxycodone, follow up with Dr. Hyacinth Meeker on Monday, pt ok with plan

## 2012-02-08 NOTE — Telephone Encounter (Signed)
Pt had DNC 2 days ago she is experiencing really bad cramping & bleeding pt wants to know if she needs to go to ER; please call patient

## 2012-02-08 NOTE — Telephone Encounter (Signed)
error 

## 2012-02-08 NOTE — ED Notes (Signed)
Pelvic exam setup at the bedside.

## 2012-02-08 NOTE — Telephone Encounter (Signed)
I called the patient and was told that she is cramping real bad and bleeding. We do not have any provider in the office that can see or ultrasound her at this time. I instructed her to go to the ED.

## 2012-02-08 NOTE — ED Notes (Signed)
Report given to FARRAH RN.

## 2012-02-08 NOTE — ED Notes (Signed)
Patient ambulatory to restroom with family. Pt states pressure in lower abdomen with urination.

## 2012-02-08 NOTE — ED Notes (Signed)
Admitting MD at bedside.

## 2012-02-09 LAB — METABOLIC PANEL, COMPREHENSIVE
A-G Ratio: 1.1 (ref 1.1–2.2)
ALT (SGPT): 35 U/L (ref 12–78)
AST (SGOT): 22 U/L (ref 15–37)
Albumin: 3.6 g/dL (ref 3.5–5.0)
Alk. phosphatase: 82 U/L (ref 50–136)
Anion gap: 6 mmol/L (ref 5–15)
BUN/Creatinine ratio: 19 (ref 12–20)
BUN: 10 MG/DL (ref 6–20)
Bilirubin, total: 0.2 MG/DL (ref 0.2–1.0)
CO2: 31 MMOL/L (ref 21–32)
Calcium: 8.9 MG/DL (ref 8.5–10.1)
Chloride: 103 MMOL/L (ref 97–108)
Creatinine: 0.53 MG/DL (ref 0.45–1.15)
GFR est AA: >60 mL/min/{1.73_m2} (ref >60)
GFR est non-AA: >60 mL/min/{1.73_m2} (ref >60)
Globulin: 3.4 g/dL (ref 2.0–4.0)
Glucose: 130 MG/DL — ABNORMAL HIGH (ref 65–100)
Potassium: 3.7 MMOL/L (ref 3.5–5.1)
Protein, total: 7 g/dL (ref 6.4–8.2)
Sodium: 140 MMOL/L (ref 136–145)

## 2012-02-09 LAB — KOH, OTHER SOURCES: KOH: NONE SEEN

## 2012-02-09 LAB — WET PREP
Clue cells: ABSENT
Wet prep: NONE SEEN

## 2012-02-09 MED ORDER — ONDANSETRON (PF) 4 MG/2 ML INJECTION
4 mg/2 mL | Freq: Once | INTRAMUSCULAR | Status: AC
Start: 2012-02-09 — End: 2012-02-08
  Administered 2012-02-09: 01:00:00 via INTRAVENOUS

## 2012-02-09 MED ADMIN — HYDROmorphone (DILAUDID) injection 2 mg: INTRAVENOUS | @ 02:00:00 | NDC 00641012121

## 2012-02-09 MED ADMIN — HYDROmorphone (PF) (DILAUDID) injection 1 mg: INTRAVENOUS | NDC 00409255201

## 2012-02-09 MED ADMIN — oxyCODONE IR (ROXICODONE) tablet 10 mg: ORAL | @ 05:00:00 | NDC 68084035411

## 2012-02-09 MED ADMIN — HYDROmorphone (DILAUDID) injection 2 mg: INTRAVENOUS | @ 04:00:00 | NDC 00641012121

## 2012-02-09 MED ADMIN — sodium chloride (NS) flush 5-10 mL: INTRAVENOUS | @ 04:00:00 | NDC 87701099893

## 2012-02-09 MED FILL — ONDANSETRON (PF) 4 MG/2 ML INJECTION: 4 mg/2 mL | INTRAMUSCULAR | Qty: 2

## 2012-02-09 MED FILL — OXYCODONE 5 MG TAB: 5 mg | ORAL | Qty: 2

## 2012-02-09 MED FILL — HYDROMORPHONE (PF) 1 MG/ML IJ SOLN: 1 mg/mL | INTRAMUSCULAR | Qty: 1

## 2012-02-09 MED FILL — HYDROMORPHONE 2 MG/ML INJECTION SOLUTION: 2 mg/mL | INTRAMUSCULAR | Qty: 1

## 2012-02-09 NOTE — ED Notes (Signed)
Pt's oxygen decreased to 82% while on RA, pt denied sob.  Reapplied oxygen at 2lnc with the monitor on x2.  Ice pack applied to abd for comfort measures.  Pt states her pain is not cramping, she has pain.

## 2012-02-09 NOTE — ED Notes (Signed)
Pt is 98% on ra, pt offers no c/o of sob.

## 2012-02-09 NOTE — ED Notes (Signed)
Discharge instructions given with a script for Roxicodone.  Pt and family member educated on follow up and not to drive under the influence of narcotics.  Scripts for [employer] given with education.  Pt and family member verbalized understanding.  Pt ambulatory in room.

## 2012-02-10 NOTE — ED Provider Notes (Signed)
I have personally seen and evaluated patient. I find the patient's history and physical exam are consistent with the PA's NP documentation. I agree with the care provided, treatments rendered, disposition and follow up plan.

## 2012-02-11 LAB — CHLAMYDIA/GC PCR
Chlamydia amplified: NEGATIVE
N. gonorrhea, amplified: NEGATIVE

## 2012-02-11 NOTE — Progress Notes (Signed)
Quick Note:    Discussed results with patient today. Will schedule for surgery  ______

## 2012-02-11 NOTE — Progress Notes (Signed)
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

## 2012-02-11 NOTE — Progress Notes (Signed)
GYN Visit Note          SUBJECTIVE:  Jody [name]  41 y.o. WF   G5 [id number]  LMP:   currently bleeding post op from hysteroscopy on Wednesday 02/06/2012  for endometrial sampling and leiomyoma.  Surgery was complicated by lack of adequate visualization.  Sampling was obtained from all portions of the uterus but complete removal of fibroid was not possible.  Patient complains of a lot of pain not well controlled by strong narcotic pain medication. She was seen in the ED on Friday and sent home on pain medication. Imaging in the ED did not show any free fluid in the pelvis, just a thickened endometrial canal.     She is still having some bleeding although it is improved from Friday.      Review of Systems: -   Negative except as above    Patient Active Problem List    Diagnosis Date Noted   ??? Malignant neoplasm of corpus uteri, except isthmus Endometrioid Adenocarcinoma  02/07/2012   ??? Intramural leiomyoma of uterus 01/29/2012   ??? Fibroids, submucosal 07/10/2011   ??? Depression 07/10/2011   ??? Hyperlipidemia 07/10/2011   ??? Diabetes mellitus 07/10/2011   ??? GERD (gastroesophageal reflux disease) 07/10/2011     Current Outpatient Prescriptions   Medication Sig Dispense Refill   ??? promethazine (PHENERGAN) 25 mg tablet Take 1 Tab by mouth every six (6) hours as needed for Nausea.  30 Tab  0   ??? oxyCODONE IR (ROXICODONE) 10 mg Tab immediate release tablet Take 1 Tab by mouth every four (4) hours as needed for Pain.  20 Tab  0   ??? HYDROcodone-acetaminophen (NORCO) 5-325 mg per tablet Take 1 Tab by mouth every six (6) hours as needed for Pain.  30 Tab  1   ??? ferrous sulfate (IRON) 325 mg (65 mg iron) tablet Take  by mouth Daily (before breakfast).       ??? metFORMIN (GLUCOPHAGE) 1,000 mg tablet Take 1 Tab by mouth two (2) times daily (with meals).  60 Tab  1   ??? glipiZIDE (GLUCOTROL) 5 mg tablet Take 1 Tab by mouth two (2) times a day.  60 Tab  0     No current facility-administered medications for this visit.     Allergies    Allergen Reactions   ??? Erythromycin Nausea and Vomiting and Swelling   ??? Levaquin (Levofloxacin) Rash and Swelling   ??? Macrolide Antibiotics Rash, Nausea and Vomiting and Swelling   ??? Nsaids (Non-Steroidal Anti-Inflammatory Drug) Other (comments)     Per pt, was told by GI d/t stomach ulcers   ??? Pcn (Penicillins) Nausea and Vomiting and Swelling   ??? Ultram (Tramadol) Nausea Only      OBJECTIVE:  Blood pressure 176/99, pulse 69, height 5\' 5"  (1.651 m), weight 252 lb (114.306 kg).    General appearance - alert, well appearing, and in no distress  Chest - clear to auscultation, no wheezes, rales or rhonchi, symmetric air entry  Heart - normal rate, regular rhythm, normal S1, S2, no murmurs, rubs, clicks or gallops  Abdomen - soft, nontender, nondistended, no masses or organomegaly    ASSESSMENT / PLAN:  Jody [name] was seen today for ed follow-up.    Diagnoses and associated orders for this visit:    Post-op pain    Nausea  - promethazine (PHENERGAN) 25 mg tablet; Take 1 Tab by mouth every six (6) hours as needed for Nausea.  Follow-up Disposition:  Return in about 1 week (around 02/18/2012) for Hysterectomy.    Post visit release of pathology report showed endometrioid adenocarcinoma and leiomyoma.  Pathology report forwarded to Dr. Sheppard Plumber at [facility] for review and definitive therapy.        Francoise Chojnowski A. Hyacinth Meeker, MD, FACOG    LTC, AUS, Retired    Geophysicist/field seismologist Clinical Professor, OB/GYN    [facility]

## 2012-02-12 NOTE — Telephone Encounter (Signed)
Dr. Hyacinth Meeker called the patient to inform her of her test results.

## 2012-02-12 NOTE — Telephone Encounter (Signed)
Pt would like to get test results; she checked My Chart and it states they are in ; please call patient

## 2012-02-13 NOTE — Progress Notes (Signed)
Subjective:      Patient : This patient is a 41 y.o. female with a chief complaint of back pain.   Pain for 7 years.  H/o back surgery to remove part of a disc but she is unsure of the particulars or the surgery.  No rehab exercises.  Cannot take NSAIDs b/c it upsets stomach.  Occasional tylenol.  Recent Rx for narcotics from ER and OB for fibroid pain and back pain.  No recent injury or trauma. ER visit on 01/11/12 for back pain and radiographs were normal.   No bowel or bladder incontinence. No fever, night sweats, or weight changes. No saddle anesthesia.    Review of Systems:  General/Constitutional:  No fever, chills, sweats, fatigue, night sweats, weakness, weight loss or weight gain   Head: No headache, no trauma   Neck: No swelling, masses, stiffness, pain, or limited movement   Cardiac: No chest pain   Respiratory: No cough, shortness of breath, or dyspnea on exertion   GI: No incontinence. No nausea/vomiting, diarrhea, abdominal pain, bloody or dark stools  GU: No incontinence. No change in urinary habits.  Peripheral Vascular: No edema, coldness, numbness, discoloration, pain, or paresthesias   Musculoskeletal: As per HPI  Neurological: No saddle distribution paresthesia. No siatic pain. No loss of consciousness, dizziness, seizures, dysarthria, cognitive changes, problems with balance, or unilateral weakness.    Past Medical History   Diagnosis Date   ??? Diabetes    ??? Asthma    ??? Endometrial thickening on ultra sound    ??? Diabetic neuropathy      Per pt diagnosed by a physician a [facility]   ??? Lumbar disc disease    ??? Other ill-defined conditions      high cholesterol   ??? Psychiatric disorder      depression, anxiety     Family History   Problem Relation Age of Onset   ??? Seizures Mother    ??? Seizures Father    ??? Diabetes Mother    ??? Asthma Mother    ??? High Cholesterol Mother    ??? Heart Attack Mother    ??? Hypertension Father    ??? Cancer Father      throat cancer   ??? Heart Attack Father     ??? Hypertension Sister    ??? Hypertension Paternal Grandmother    ??? Diabetes Paternal Grandmother    ??? Breast Cancer Paternal Grandmother    ??? Other Paternal Grandfather      leukemia     Current Outpatient Prescriptions   Medication Sig Dispense Refill   ??? promethazine (PHENERGAN) 25 mg tablet Take 1 Tab by mouth every six (6) hours as needed for Nausea.  30 Tab  0   ??? ferrous sulfate (IRON) 325 mg (65 mg iron) tablet Take  by mouth Daily (before breakfast).       ??? metFORMIN (GLUCOPHAGE) 1,000 mg tablet Take 1 Tab by mouth two (2) times daily (with meals).  60 Tab  1   ??? glipiZIDE (GLUCOTROL) 5 mg tablet Take 1 Tab by mouth two (2) times a day.  60 Tab  0   ??? oxyCODONE IR (ROXICODONE) 10 mg Tab immediate release tablet Take 1 Tab by mouth every four (4) hours as needed for Pain.  20 Tab  0   ??? HYDROcodone-acetaminophen (NORCO) 5-325 mg per tablet Take 1 Tab by mouth every six (6) hours as needed for Pain.  30 Tab  1  No current facility-administered medications for this visit.     Allergies   Allergen Reactions   ??? Erythromycin Nausea and Vomiting and Swelling   ??? Levaquin (Levofloxacin) Rash and Swelling   ??? Macrolide Antibiotics Rash, Nausea and Vomiting and Swelling   ??? Nsaids (Non-Steroidal Anti-Inflammatory Drug) Other (comments)     Per pt, was told by GI d/t stomach ulcers   ??? Pcn (Penicillins) Nausea and Vomiting and Swelling   ??? Ultram (Tramadol) Nausea Only     History     Social History   ??? Marital Status: LEGALLY SEPARATED     Spouse Name: N/A     Number of Children: N/A   ??? Years of Education: N/A     Occupational History   ??? Not on file.     Social History Main Topics   ??? Smoking status: Former Smoker -- 10 years     Quit date: 07/11/2010   ??? Smokeless tobacco: Never Used   ??? Alcohol Use: No   ??? Drug Use: No   ??? Sexually Active: Yes -- Female partner(s)     Birth Control/ Protection: Surgical      Comment: BTL 19 years ago     Other Topics Concern   ??? Not on file     Social History Narrative   ??? No  narrative on file       Objective:     BP 107/54   Pulse 79   Temp(Src) 97.8 ??F (36.6 ??C) (Oral)   Resp 16   Ht 5\' 5"  (1.651 m)   Wt 251 lb (113.853 kg)   BMI 41.77 kg/m2    General: Alert and oriented and in no acute distress. Responds to all questions appropriately.   LUNGS: Respirations unlabored.  Skin: No obvious rash.  MSK:    Posture: Normal   Deformity: None    ROM: Limited due to pain       Gait: Normal       Palpation: Diffuse tenderness in the lower back with most of the tenderness in the bilateral paraspinal muscle area.        Strength (0-5/5)    Hip Flexion:   Left: 5/5  Right: 5/5    Hip Extension:  Left: 5/5  Right: 5/5    Hip Abduction:  Left: 5/5  Right: 5/5    Hip Adduction:  Left: 5/5  Right: 5/5    Knee Extension:  Left: 5/5  Right: 5/5    Knee Flexion:   Left: 5/5  Right: 5/5    Ankle dorsiflexion:  Left: 5/5  Right: 5/5    Ankle plantarflexion:  Left: 5/5  Right: 5/5    Great toe extension:  Left: 5/5  Right: 5/5     Sensation: Intact, no deficits      Special test:    Straight leg: Left: Negative  Right: Negative      Imaging: Radiographs of the lumbar spine from 01/11/12 reviewed and demonstrates no obvious fracture or dislocation.    Assessment:     1. Back pain  REFERRAL TO PHYSICAL THERAPY   Likely paraspinal muscle spasm and myofascial pain.      Plan:   1. Home Exercise Program as per handout.  Referred to PT  2. Ice 15 minutes, three times a day PRN    Medications:    1.Acetaminophen (Tylenol):  500mg  1-2 tablets every 6 hours as needed for pain.       F/U: PRN

## 2012-02-13 NOTE — Progress Notes (Signed)
Chief Complaint   Patient presents with   ??? Back Pain     Reviewed record in preparation for visit and have obtained the necessary documentation.

## 2012-02-16 NOTE — Progress Notes (Signed)
Pt seen and interviewed by me.  Pt reports a recent D&C for thickened endometrium which resulted with an endometrial cancer.  She has been referred to Dr. Sheppard Plumber, [facility], for further management.  However, since her surgery, the patient reports suprapubic and LLQ/left groin pain.  She has been taking 10mg  Roxicodone Q4 hrs, but this does not seem to be helping.  She has been having nausea and has been treating with Phenergan.  She also complains of being hot.  She states that her vaginal bleeding has stopped, but she now notes a yellow discharge on her pad and is concerned for uterine infection.  No elevation in white count.  Wet mount negative for infection.  Urine with significant glucose, blood, mucus, bacteria and WBCs.    Pt to be treated for UTI.  Recommend imaging to r/o GI obstruction- although pt reports +BM today of normal volume.  If no signs of GI obstruction/ileus, then d/c home with new narcotic Rx (consider Norco) and antibiotic for UTI.    Additionally, pt with current nausea and cannot begin antibiotic due to this.  Consider IV administration of Compazine or Phenergan since Zofran has not been effective.    Kimberlye Dilger L. Laural Roes, M.D., [facility], [facility]  Obstetrics, Gynecology, & [employer]TECHNOLOGY  [facility]  [address], [address]  [place], [place] [address]  Office: [phone]

## 2012-02-16 NOTE — ED Notes (Signed)
Patient states that she had a D&C last week and is still having pain from it.  Patient states that she has LLQ pain. Patient states that she has a yellowish discharge like an infection now as well.

## 2012-02-16 NOTE — ED Provider Notes (Signed)
Patient is a 41 y.o. female presenting with abdominal pain. The history is provided by the patient.   Abdominal Pain   This is a new problem. The current episode started more than 1 week ago. The problem has been gradually worsening. The pain is located in the suprapubic region and LLQ. The pain is severe. Associated symptoms include a fever and nausea. Pertinent negatives include no diarrhea and no vomiting. Nothing worsens the pain. The pain is relieved by nothing.    4 41yo F who presents with lower abd pain since a DC. Now sts she has yellowish dc.     Past Medical History   Diagnosis Date   ??? Diabetes    ??? Asthma    ??? Endometrial thickening on ultra sound    ??? Diabetic neuropathy      Per pt diagnosed by a physician a [facility]   ??? Lumbar disc disease    ??? Other ill-defined conditions      high cholesterol   ??? Psychiatric disorder      depression, anxiety        Past Surgical History   Procedure Laterality Date   ??? Hx orthopaedic       back   ??? Hx appendectomy     ??? Hx tubal ligation  1994   ??? Pr hysteroscopy,w/endo bx N/A 02/06/2012     OPERATIVE HYSTEROSCOPY, RESECTION SUBMUCOSSAL FIBROIDS performed by Eyvonne Mechanic, MD at [facility] MAIN OR         Family History   Problem Relation Age of Onset   ??? Seizures Mother    ??? Seizures Father    ??? Diabetes Mother    ??? Asthma Mother    ??? High Cholesterol Mother    ??? Heart Attack Mother    ??? Hypertension Father    ??? Cancer Father      throat cancer   ??? Heart Attack Father    ??? Hypertension Sister    ??? Hypertension Paternal Grandmother    ??? Diabetes Paternal Grandmother    ??? Breast Cancer Paternal Grandmother    ??? Other Paternal Grandfather      leukemia        History     Social History   ??? Marital Status: LEGALLY SEPARATED     Spouse Name: N/A     Number of Children: N/A   ??? Years of Education: N/A     Occupational History   ??? Not on file.     Social History Main Topics   ??? Smoking status: Former Smoker -- 10 years     Quit date: 07/11/2010   ??? Smokeless  tobacco: Never Used   ??? Alcohol Use: No   ??? Drug Use: No   ??? Sexually Active: Yes -- Female partner(s)     Birth Control/ Protection: Surgical      Comment: BTL 19 years ago     Other Topics Concern   ??? Not on file     Social History Narrative   ??? No narrative on file                  ALLERGIES: Erythromycin; Levaquin; Macrolide antibiotics; Nsaids (non-steroidal anti-inflammatory drug); Pcn; and Ultram      Review of Systems   Constitutional: Positive for fever. Negative for chills.   Respiratory: Negative for cough and shortness of breath.    Gastrointestinal: Positive for nausea and abdominal pain. Negative for vomiting and diarrhea.  Genitourinary: Positive for vaginal discharge and vaginal pain.   All other systems reviewed and are negative.        Filed Vitals:    02/16/12 1943   BP: 136/63   Pulse: 94   Temp: 98.3 ??F (36.8 ??C)   Resp: 16   Height: 5\' 5"  (1.651 m)   Weight: 113.399 kg (250 lb)   SpO2: 95%            Physical Exam   Nursing note and vitals reviewed.  Constitutional: She is oriented to person, place, and time. She appears well-developed and well-nourished. No distress.   HENT:   Head: Normocephalic and atraumatic.   Right Ear: External ear normal.   Left Ear: External ear normal.   Eyes: Conjunctivae and EOM are normal. Pupils are equal, round, and reactive to light. Right eye exhibits no discharge. Left eye exhibits no discharge. No scleral icterus.   Neck: Normal range of motion. No tracheal deviation present.   Cardiovascular: Normal rate and normal heart sounds.    No murmur heard.  Pulmonary/Chest: Effort normal and breath sounds normal. No respiratory distress. She has no wheezes. She has no rales. She exhibits no tenderness.   Abdominal: Soft. Bowel sounds are normal. She exhibits no distension. There is no tenderness. There is no rebound and no guarding.   Genitourinary: Vaginal discharge found.   Pelvic done with female tech in room - +small amount of dc appears like yeast     Musculoskeletal: Normal range of motion.   Neurological: She is alert and oriented to person, place, and time. No cranial nerve deficit. Coordination normal.   Skin: Skin is warm. She is not diaphoretic. No erythema.   Psychiatric: She has a normal mood and affect. Her behavior is normal. Judgment and thought content normal.        MDM    Procedures

## 2012-02-17 LAB — URINALYSIS W/ REFLEX CULTURE
Bilirubin: NEGATIVE
Glucose: >1000 MG/DL — AB
Ketone: NEGATIVE MG/DL
Leukocyte Esterase: NEGATIVE
Nitrites: NEGATIVE
Specific gravity: 1.03 (ref 1.003–1.030)
Urobilinogen: 0.2 EU/DL (ref 0.2–1.0)
pH (UA): 5.5 (ref 5.0–8.0)

## 2012-02-17 LAB — POC CHEM8
Anion gap (POC): 16 mmol/L — ABNORMAL HIGH (ref 5–15)
BUN (POC): 11 MG/DL (ref 9–20)
CO2 (POC): 25 MMOL/L (ref 21–32)
Calcium, ionized (POC): 1.12 MMOL/L (ref 1.12–1.32)
Chloride (POC): 102 MMOL/L (ref 98–107)
Creatinine (POC): 0.7 MG/DL (ref 0.6–1.3)
GFRAA, POC: >60 mL/min/{1.73_m2} (ref >60)
GFRNA, POC: >60 mL/min/{1.73_m2} (ref >60)
Glucose (POC): 246 MG/DL — ABNORMAL HIGH (ref 65–105)
Hematocrit (POC): 39 % (ref 35.0–47.0)
Hemoglobin (POC): 13.3 GM/DL (ref 11.5–16.0)
Potassium (POC): 3.8 MMOL/L (ref 3.5–5.1)
Sodium (POC): 138 MMOL/L (ref 136–145)

## 2012-02-17 LAB — CBC WITH AUTOMATED DIFF
ABS. BASOPHILS: 0 10*3/uL (ref 0.0–0.1)
ABS. EOSINOPHILS: 0.3 10*3/uL (ref 0.0–0.4)
ABS. LYMPHOCYTES: 2.9 10*3/uL (ref 0.8–3.5)
ABS. MONOCYTES: 0.4 10*3/uL (ref 0.0–1.0)
ABS. NEUTROPHILS: 4.7 10*3/uL (ref 1.8–8.0)
BASOPHILS: 0 % (ref 0–1)
EOSINOPHILS: 4 % (ref 0–7)
HCT: 39 % (ref 35.0–47.0)
HGB: 13.7 g/dL (ref 11.5–16.0)
LYMPHOCYTES: 35 % (ref 12–49)
MCH: 28.4 PG (ref 26.0–34.0)
MCHC: 35.1 g/dL (ref 30.0–36.5)
MCV: 80.9 FL (ref 80.0–99.0)
MONOCYTES: 5 % (ref 5–13)
NEUTROPHILS: 56 % (ref 32–75)
PLATELET: 221 10*3/uL (ref 150–400)
RBC: 4.82 M/uL (ref 3.80–5.20)
RDW: 17.4 % — ABNORMAL HIGH (ref 11.5–14.5)
WBC: 8.3 10*3/uL (ref 3.6–11.0)

## 2012-02-17 LAB — WET PREP
Clue cells: ABSENT
Wet prep: NONE SEEN

## 2012-02-17 LAB — KOH, OTHER SOURCES: KOH: NONE SEEN

## 2012-02-17 LAB — HCG URINE, QL. - POC: Pregnancy test,urine (POC): NEGATIVE

## 2012-02-17 MED ADMIN — morphine injection 4 mg: INTRAVENOUS | @ 02:00:00 | NDC 00409189101

## 2012-02-17 MED ADMIN — morphine injection 4 mg: INTRAVENOUS | @ 05:00:00 | NDC 00409189101

## 2012-02-17 MED ADMIN — trimethoprim-sulfamethoxazole (BACTRIM DS, SEPTRA DS) 160-800 mg per tablet 1 Tab: ORAL | @ 05:00:00 | NDC 68084023011

## 2012-02-17 MED ADMIN — ondansetron (ZOFRAN) injection 4 mg: INTRAVENOUS | @ 02:00:00 | NDC 00409475503

## 2012-02-17 MED ADMIN — prochlorperazine (COMPAZINE) injection 10 mg: INTRAVENOUS | @ 05:00:00 | NDC 55390007710

## 2012-02-17 MED FILL — MORPHINE 4 MG/ML SYRINGE: 4 mg/mL | INTRAMUSCULAR | Qty: 1

## 2012-02-17 MED FILL — ONDANSETRON (PF) 4 MG/2 ML INJECTION: 4 mg/2 mL | INTRAMUSCULAR | Qty: 2

## 2012-02-17 MED FILL — TRIMETHOPRIM-SULFAMETHOXAZOLE 160 MG-800 MG TAB: 160-800 mg | ORAL | Qty: 1

## 2012-02-17 MED FILL — PROCHLORPERAZINE EDISYLATE 5 MG/ML INJECTION: 5 mg/mL | INTRAMUSCULAR | Qty: 2

## 2012-02-17 NOTE — ED Notes (Signed)
Seen by ob good to go if neg x ray will dc

## 2012-02-17 NOTE — ED Notes (Signed)
I have reviewed discharge instructions with the patient.  The patient verbalized understanding. Rx given. Follow up discussed. Pt declined wheelchair. Appears moderately comfortable. Steady gait, home with family.

## 2012-02-18 LAB — CULTURE, URINE
Colonies Counted: <10000
Colony Count: <10000
Culture result:: NO GROWTH
Culture: NO GROWTH

## 2012-02-18 LAB — POC CHEM8
CO2 (POC): 24 MMOL/L (ref 21–32)
Calcium, ionized (POC): 1.12 MMOL/L (ref 1.12–1.32)
Creatinine (POC): 0.8 MG/DL (ref 0.6–1.3)
GFRAA, POC: >60 mL/min/{1.73_m2} (ref >60)
GFRNA, POC: >60 mL/min/{1.73_m2} (ref >60)
Glucose (POC): 247 MG/DL — ABNORMAL HIGH (ref 65–105)
Hematocrit (POC): 39 % (ref 35.0–47.0)
Hemoglobin (POC): 13.3 GM/DL (ref 11.5–16.0)
Potassium (POC): 3.8 MMOL/L (ref 3.5–5.1)
Sodium (POC): 139 MMOL/L (ref 136–145)

## 2012-02-19 LAB — CHLAMYDIA/GC PCR
Chlamydia amplified: NEGATIVE
N. gonorrhea, amplified: NEGATIVE

## 2012-02-21 NOTE — Progress Notes (Signed)
[facility]  [address]  [place], [place] [address]  [phone] FAX [phone]  Kathe Mariner, MD   Annia Friendly, MD  Office Note  Patient ID:  Name:  Jody [name]  MRN:  [id number]  DOB:  [birth year]/41 y.o.  Date:  02/21/2012      HISTORY OF PRESENT ILLNESS:  Jody [name] is a 41 y.o. Caucasian premenopausal female who is being seen for endometrial carcinoma.  She is referred by Dr. Therese Sarah.  She underwent a hysteroscopy/D&C for abnormal bleeding.  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  I have been asked to see her for further evaluation and management.        Problem List:  Patient Active Problem List    Diagnosis Date Noted   ??? Malignant neoplasm of corpus uteri, except isthmus Endometrioid Adenocarcinoma  02/07/2012   ??? Intramural leiomyoma of uterus 01/29/2012   ??? Fibroids, submucosal 07/10/2011   ??? Depression 07/10/2011   ??? Hyperlipidemia 07/10/2011   ??? Diabetes mellitus 07/10/2011   ??? GERD (gastroesophageal reflux disease) 07/10/2011     PMH:  Past Medical History   Diagnosis Date   ??? Diabetes    ??? Asthma    ??? Endometrial thickening on ultra sound    ??? Diabetic neuropathy      Per pt diagnosed by a physician a [facility]   ??? Lumbar disc disease    ??? Other ill-defined conditions      high cholesterol   ??? Psychiatric disorder      depression, anxiety      PSH:  Past Surgical History   Procedure Laterality Date   ??? Hx orthopaedic       back   ??? Hx appendectomy     ??? Hx tubal ligation  1994   ??? Pr hysteroscopy,w/endo bx N/A 02/06/2012     OPERATIVE HYSTEROSCOPY, RESECTION SUBMUCOSSAL FIBROIDS performed by Eyvonne Mechanic, MD at [facility] MAIN OR      Social History:  History   Substance Use Topics   ??? Smoking status: Former Smoker -- 10 years     Quit date: 07/11/2010   ??? Smokeless tobacco: Never Used   ??? Alcohol Use: No      Family History:  Family History   Problem Relation Age of Onset   ??? Seizures Mother    ??? Seizures  Father    ??? Diabetes Mother    ??? Asthma Mother    ??? High Cholesterol Mother    ??? Heart Attack Mother    ??? Hypertension Father    ??? Cancer Father      throat cancer   ??? Heart Attack Father    ??? Hypertension Sister    ??? Hypertension Paternal Grandmother    ??? Diabetes Paternal Grandmother    ??? Breast Cancer Paternal Grandmother    ??? Other Paternal Grandfather      leukemia      Medications:  Current Outpatient Prescriptions   Medication Sig   ??? trimethoprim-sulfamethoxazole (BACTRIM DS) 160-800 mg per tablet Take 1 Tab by mouth two (2) times a day for 7 days.   ??? promethazine (PHENERGAN) 25 mg tablet Take 1 Tab by mouth every six (6) hours as needed for Nausea.   ??? ferrous sulfate (IRON) 325 mg (65 mg iron) tablet Take  by mouth Daily (before breakfast).   ??? metFORMIN (GLUCOPHAGE) 1,000 mg tablet Take 1 Tab by mouth two (  2) times daily (with meals).   ??? glipiZIDE (GLUCOTROL) 5 mg tablet Take 1 Tab by mouth two (2) times a day.   ??? HYDROcodone-acetaminophen (NORCO) 5-325 mg per tablet Take 1 Tab by mouth every four (4) hours as needed for Pain.   ??? oxyCODONE IR (ROXICODONE) 10 mg Tab immediate release tablet Take 1 Tab by mouth every four (4) hours as needed for Pain.   ??? HYDROcodone-acetaminophen (NORCO) 5-325 mg per tablet Take 1 Tab by mouth every six (6) hours as needed for Pain.     No current facility-administered medications for this visit.     Allergies  Allergies   Allergen Reactions   ??? Erythromycin Nausea and Vomiting and Swelling   ??? Levaquin (Levofloxacin) Rash and Swelling   ??? Macrolide Antibiotics Rash, Nausea and Vomiting and Swelling   ??? Nsaids (Non-Steroidal Anti-Inflammatory Drug) Other (comments)     Per pt, was told by GI d/t stomach ulcers   ??? Pcn (Penicillins) Nausea and Vomiting and Swelling   ??? Ultram (Tramadol) Nausea Only        REVIEW OF SYSTEMS:  A comprehensive review of systems was negative except for that written in the History of Present Illness. , 10 point ROS    OBJECTIVE:    Physical  Exam:  VITAL SIGNS: BP 139/63   Pulse 72   Ht 5\' 5"  (1.651 m)   Wt 250 lb (113.399 kg)   BMI 41.6 kg/m2   LMP 05/18/2011   GENERAL APP: in no apparent distress and well developed and well nourished   HEENT: within normal limits   RESPIRATORY: lungs clear to auscultation   CARDIOVASC: Regular rate and rhythm   GASTROINT: soft, non-tender, without masses or organomegaly   MUSCULOSKEL: no joint tenderness, deformity or swelling   INTEGUMENT: no rash or abnormalities   EXTREMITIES: extremities normal, atraumatic, no cyanosis or edema   PELVIC: Vulva and vagina appear normal. Bimanual exam reveals a mildly enlarged but mobile uterus and the adnexa were nonpalpable.   RECTAL: deferred   NODAL SURVEY: Cervical, supraclavicular, and axillary nodes normal.   NEURO: Grossly normal       Lab Results   Component Value Date/Time    WBC 8.3 02/16/2012  8:15 PM    HGB 13.7 02/16/2012  8:15 PM    HCT 39.0 02/16/2012  8:15 PM    PLATELET 221 02/16/2012  8:15 PM    MCV 80.9 02/16/2012  8:15 PM     Lab Results   Component Value Date/Time    Sodium 140 02/08/2012  6:10 PM    Potassium 3.7 02/08/2012  6:10 PM    Chloride 103 02/08/2012  6:10 PM    CO2 31 02/08/2012  6:10 PM    Anion gap 6 02/08/2012  6:10 PM    Glucose 130 02/08/2012  6:10 PM    BUN 10 02/08/2012  6:10 PM    Creatinine 0.53 02/08/2012  6:10 PM    BUN/Creatinine ratio 19 02/08/2012  6:10 PM    GFR est AA >60 02/08/2012  6:10 PM    GFR est non-AA >60 02/08/2012  6:10 PM    Calcium 8.9 02/08/2012  6:10 PM       Pelvic ultrasound (02/08/12)  Transabdominal: The uterus measures 9.4x6x6.6 cm and is homogeneous. The endometrial stripe is not well evaluated because of an empty the bladder. Neither ovary is seen, presumably secondary to position.   Transvaginal: The uterus measures 10x5.9x6.9 cm. Within the uterus, there  are 2 fibroids. One is submucosal and measures 2.1x2.3x2.6 cm. One is anterior intramural measures 1.7x1.3x1.5 cm. The endometrial stripe measures 15 mm. The right ovary  measures 3.5x2.8x2.9 cm and the left ovary is not seen, presumably secondary to position. There is no mass or fluid or other abnormality in the adnexa or cul-de-sac.     IMPRESSION: The great uterus. No evidence for retained products of   conception or tissue. Non-visualization of the left ovary.      IMPRESSION/PLAN:  Jody [name] is a 41 y.o. female with a working diagnosis of grade 1 endometrial carcinoma.  I reviewed with Jody [name] her medical records, physical exam, and review of symptoms.  I reviewed the standard of care with her and recommended a laparoscopic hysterectomy with BSO and staging lymphadenectomy.  She is counseled on the risks, benefits, indications, and alternatives of surgery.  Her questions were answered and she wishes to proceed with surgery.      Signed By: Domenica Fail, MD     02/21/2012/10:38 AM

## 2012-02-25 ENCOUNTER — Encounter

## 2012-02-25 NOTE — Telephone Encounter (Signed)
Patient calling and states that she is having LLQ pain.  Patient had hysterscopy on 11/6 with Dr.Miller and surgery scheduled for 12/4 with Dr.Hyde hysterectomy.   Patient went to the ED on 11/16 for UTI and prescribed bactrim and is now finished with medication.  Patient has tried hydrocodone and roxicodone but has not worked.  Per Dr.Miller to write rx for Dilaudid 1mg  Q 6 hours til surgery scheduled on 12/4.  Called patient back and verbalized this to patient.  Patient verbalized understanding and ok with recommendation.  Patient to pick up rx in office.

## 2012-02-26 NOTE — Telephone Encounter (Signed)
Called and spoke to patient

## 2012-02-26 NOTE — Progress Notes (Signed)
Can let her know that EEG, Heart Echo, and Ultrasound of neck arteries were all normal.

## 2012-02-26 NOTE — Telephone Encounter (Signed)
Message from Hello: Will not make appt at 1. Was told to call if I could not make it so I could be taken of schedule. Please call today to find out results. [phone].

## 2012-02-26 NOTE — Telephone Encounter (Signed)
Called patient and she was not able to make appt for today and wanted to get results of any testing  if possible   Please advise

## 2012-02-26 NOTE — Telephone Encounter (Signed)
Can let her know that EEG, Heart Echo, and Ultrasound of neck arteries were all normal.           Called and spoke to patient and made her aware of results  Patient states understanding

## 2012-02-27 NOTE — Progress Notes (Signed)
Has been on antidepressant in the past    Has used  paxil in the past (was on it for six months)    Went to Dr. Sheppard Plumber for endometrial cancer -- did an exam  Plans to do hysterectomy/BSO    Cancer has killed a lot of people in her family    Will start her on zoloft and refill xanax     Discussed surgery with patient    Informed pt that she will be menopausal after the surgery and may experience menopausal sxs    Greater than 50% of this 20 minute visit was counseling about depression, anxiety, endometrial cancer, and DM.

## 2012-02-27 NOTE — Progress Notes (Signed)
Pt is here today w/ c/o depression and anxiety d/t current medical condition and family stressors.  She is requesting anti-depressants.

## 2012-03-04 NOTE — H&P (Signed)
[facility]  [address]  [place], [place] [address]  [phone] FAX [phone]  Kathe Mariner, MD   Annia Friendly, MD  Office Note  Patient ID:  Name:  Jody [name]  MRN:  [id number]  DOB:  June 23, 1970/41 y.o.  Date:  03/04/2012      HISTORY OF PRESENT ILLNESS:  Jody [name] is a 41 y.o. Caucasian premenopausal female who is being seen for endometrial carcinoma. She is referred by Dr. Therese Sarah. She underwent a hysteroscopy/D&C for abnormal bleeding. 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 I have been asked to see her for further evaluation and management.       Problem List:  Patient Active Problem List    Diagnosis Date Noted   ??? Malignant neoplasm of corpus uteri, except isthmus Endometrioid Adenocarcinoma  02/07/2012   ??? Intramural leiomyoma of uterus 01/29/2012   ??? Fibroids, submucosal 07/10/2011   ??? Depression 07/10/2011   ??? Hyperlipidemia 07/10/2011   ??? Diabetes mellitus 07/10/2011   ??? GERD (gastroesophageal reflux disease) 07/10/2011     PMH:  Past Medical History   Diagnosis Date   ??? Diabetes    ??? Asthma    ??? Endometrial thickening on ultra sound    ??? Diabetic neuropathy      Per pt diagnosed by a physician a [facility]   ??? Lumbar disc disease    ??? Other ill-defined conditions      high cholesterol   ??? Psychiatric disorder      depression, anxiety   ??? Cancer 2013     uterine      PSH:  Past Surgical History   Procedure Laterality Date   ??? Hx orthopaedic       back   ??? Hx appendectomy     ??? Hx tubal ligation  1994   ??? Pr hysteroscopy,w/endo bx N/A 02/06/2012     OPERATIVE HYSTEROSCOPY, RESECTION SUBMUCOSSAL FIBROIDS performed by Eyvonne Mechanic, MD at [facility] MAIN OR      Social History:  History   Substance Use Topics   ??? Smoking status: Former Smoker -- 10 years     Quit date: 07/11/2010   ??? Smokeless tobacco: Never Used   ??? Alcohol Use: [name]      Family History:  Family History   Problem Relation Age of Onset   ??? Seizures  Mother    ??? Seizures Father    ??? Diabetes Mother    ??? Asthma Mother    ??? High Cholesterol Mother    ??? Heart Attack Mother    ??? Hypertension Father    ??? Cancer Father      throat cancer   ??? Heart Attack Father    ??? Hypertension Sister    ??? Hypertension Paternal Grandmother    ??? Diabetes Paternal Grandmother    ??? Breast Cancer Paternal Grandmother    ??? Cancer Paternal Grandfather      leukemia      Medications:  [name] current facility-administered medications for this encounter.     Current Outpatient Prescriptions   Medication Sig   ??? sertraline (ZOLOFT) 50 mg tablet TAKE ONE TABLET BY MOUTH EVERY DAY   ??? ALPRAZolam (XANAX) 0.25 mg tablet Take 1 Tab by mouth three (3) times daily as needed for Anxiety.   ??? HYDROmorphone (DILAUDID) 2 mg tablet Take 1 Tab by mouth every six (6) hours as needed for Pain for 9 days.   ???  promethazine (PHENERGAN) 25 mg tablet Take 1 Tab by mouth every six (6) hours as needed for Nausea.   ??? ferrous sulfate (IRON) 325 mg (65 mg iron) tablet Take  by mouth Daily (before breakfast).   ??? metFORMIN (GLUCOPHAGE) 1,000 mg tablet Take 1 Tab by mouth two (2) times daily (with meals).   ??? glipiZIDE (GLUCOTROL) 5 mg tablet Take 1 Tab by mouth two (2) times a day.     Allergies  Allergies   Allergen Reactions   ??? Erythromycin Nausea and Vomiting and Swelling   ??? Levaquin (Levofloxacin) Rash and Swelling   ??? Macrolide Antibiotics Rash, Nausea and Vomiting and Swelling   ??? Nsaids (Non-Steroidal Anti-Inflammatory Drug) Other (comments)     Per pt, was told by GI d/t stomach ulcers   ??? Pcn (Penicillins) Nausea and Vomiting and Swelling   ??? Ultram (Tramadol) Nausea Only        REVIEW OF SYSTEMS:  A comprehensive review of systems was negative except for that written in the History of Present Illness. , 10 point ROS    OBJECTIVE:    Physical Exam:  VITAL SIGNS: LMP 05/18/2011   GENERAL APP: in [name] apparent distress and well developed and well nourished   HEENT: within normal limits   RESPIRATORY: lungs clear to  auscultation   CARDIOVASC: Regular rate and rhythm   GASTROINT: soft, non-tender, without masses or organomegaly   MUSCULOSKEL: [name] joint tenderness, deformity or swelling   INTEGUMENT: [name] rash or abnormalities   EXTREMITIES: extremities normal, atraumatic, [name] cyanosis or edema   PELVIC: Vulva and vagina appear normal. Bimanual exam reveals a mildly enlarged but mobile uterus and the adnexa were nonpalpable.    RECTAL: deferred   NODAL SURVEY: Cervical, supraclavicular, and axillary nodes normal.   NEURO: Grossly normal       Lab Results   Component Value Date/Time    WBC 8.3 02/16/2012  8:15 PM    HGB 13.7 02/16/2012  8:15 PM    HCT 39.0 02/16/2012  8:15 PM    PLATELET 221 02/16/2012  8:15 PM    MCV 80.9 02/16/2012  8:15 PM     Lab Results   Component Value Date/Time    Sodium 140 02/08/2012  6:10 PM    Potassium 3.7 02/08/2012  6:10 PM    Chloride 103 02/08/2012  6:10 PM    CO2 31 02/08/2012  6:10 PM    Anion gap 6 02/08/2012  6:10 PM    Glucose 130 02/08/2012  6:10 PM    BUN 10 02/08/2012  6:10 PM    Creatinine 0.53 02/08/2012  6:10 PM    BUN/Creatinine ratio 19 02/08/2012  6:10 PM    GFR est AA >60 02/08/2012  6:10 PM    GFR est non-AA >60 02/08/2012  6:10 PM    Calcium 8.9 02/08/2012  6:10 PM       Pelvic ultrasound (02/08/12)   Transabdominal: The uterus measures 9.4x6x6.6 cm and is homogeneous. The endometrial stripe is not well evaluated because of an empty the bladder. Neither ovary is seen, presumably secondary to position.   Transvaginal: The uterus measures 10x5.9x6.9 cm. Within the uterus, there are 2 fibroids. One is submucosal and measures 2.1x2.3x2.6 cm. One is anterior intramural measures 1.7x1.3x1.5 cm. The endometrial stripe measures 15 mm. The right ovary measures 3.5x2.8x2.9 cm and the left ovary is not seen, presumably secondary to position. There is [name] mass or fluid or other abnormality in the adnexa or cul-de-sac.  IMPRESSION: The great uterus. [name] evidence for retained products of   conception or  tissue. Non-visualization of the left ovary.      IMPRESSION/PLAN:  Jody [name] is a 40 y.o. female with a working diagnosis of grade 1 endometrial carcinoma. I reviewed with Jody [name] her medical records, physical exam, and review of symptoms. I reviewed the standard of care with her and recommended a laparoscopic hysterectomy with BSO and staging lymphadenectomy. She is counseled on the risks, benefits, indications, and alternatives of surgery. Her questions were answered and she wishes to proceed with surgery.        Signed By: Domenica Fail, MD     03/04/2012/3:45 PM

## 2012-03-05 ENCOUNTER — Inpatient Hospital Stay
Admit: 2012-03-05 | Discharge: 2012-03-06 | Disposition: A | Discharge status: Home / Self Care | Payer: Charity | Attending: Specialist | Admitting: Specialist

## 2012-03-05 DIAGNOSIS — C549 Malignant neoplasm of corpus uteri, unspecified: Secondary | ICD-10-CM

## 2012-03-05 LAB — GLUCOSE, POC
Glucose (POC): 172 mg/dL — ABNORMAL HIGH (ref 65–105)
Glucose (POC): 237 mg/dL — ABNORMAL HIGH (ref 65–105)
Glucose (POC): 200 mg/dL — ABNORMAL HIGH (ref 65–105)

## 2012-03-05 LAB — HCG URINE, QL. - POC: Pregnancy test,urine (POC): NEGATIVE

## 2012-03-05 MED ORDER — ACETAMINOPHEN 325 MG TABLET
325 mg | ORAL | Status: DC | PRN
Start: 2012-03-05 — End: 2012-03-06

## 2012-03-05 MED ORDER — DIPHENHYDRAMINE 25 MG CAP
25 mg | ORAL | Status: DC | PRN
Start: 2012-03-05 — End: 2012-03-06

## 2012-03-05 MED ORDER — PROMETHAZINE 25 MG/ML INJECTION
25 mg/mL | Freq: Four times a day (QID) | INTRAMUSCULAR | Status: DC | PRN
Start: 2012-03-05 — End: 2012-03-05

## 2012-03-05 MED ORDER — INSULIN REGULAR HUMAN 100 UNIT/ML INJECTION
100 unit/mL | Freq: Four times a day (QID) | INTRAMUSCULAR | Status: DC
Start: 2012-03-05 — End: 2012-03-06
  Administered 2012-03-05 – 2012-03-06 (×4): via SUBCUTANEOUS

## 2012-03-05 MED ORDER — INSULIN REGULAR HUMAN 100 UNIT/ML INJECTION
100 unit/mL | Freq: Once | INTRAMUSCULAR | Status: AC
Start: 2012-03-05 — End: 2012-03-05
  Administered 2012-03-05: 21:00:00 via SUBCUTANEOUS

## 2012-03-05 MED ORDER — DEXTROSE 50% IN WATER (D50W) IV SYRG
INTRAVENOUS | Status: DC | PRN
Start: 2012-03-05 — End: 2012-03-06

## 2012-03-05 MED ORDER — LORAZEPAM 2 MG/ML IJ SOLN
2 mg/mL | Freq: Four times a day (QID) | INTRAMUSCULAR | Status: DC | PRN
Start: 2012-03-05 — End: 2012-03-06
  Administered 2012-03-06: 04:00:00 via INTRAVENOUS

## 2012-03-05 MED ORDER — NALOXONE 0.4 MG/ML INJECTION
0.4 mg/mL | INTRAMUSCULAR | Status: DC | PRN
Start: 2012-03-05 — End: 2012-03-06

## 2012-03-05 MED ORDER — GLUCAGON 1 MG INJECTION
1 mg | INTRAMUSCULAR | Status: DC | PRN
Start: 2012-03-05 — End: 2012-03-06

## 2012-03-05 MED ORDER — PROCHLORPERAZINE EDISYLATE 5 MG/ML INJECTION
5 mg/mL | Freq: Four times a day (QID) | INTRAMUSCULAR | Status: DC | PRN
Start: 2012-03-05 — End: 2012-03-06

## 2012-03-05 MED ORDER — ALPRAZOLAM 0.25 MG TAB
0.25 mg | Freq: Three times a day (TID) | ORAL | Status: DC | PRN
Start: 2012-03-05 — End: 2012-03-06
  Administered 2012-03-06 (×2): via ORAL

## 2012-03-05 MED ORDER — HYDROMORPHONE 2 MG/ML INJECTION SOLUTION
2 mg/mL | INTRAMUSCULAR | Status: DC | PRN
Start: 2012-03-05 — End: 2012-03-05

## 2012-03-05 MED ORDER — ONDANSETRON (PF) 4 MG/2 ML INJECTION
4 mg/2 mL | INTRAMUSCULAR | Status: DC | PRN
Start: 2012-03-05 — End: 2012-03-06

## 2012-03-05 MED ORDER — OXYCODONE-ACETAMINOPHEN 10 MG-325 MG TAB
10-325 mg | ORAL | Status: DC | PRN
Start: 2012-03-05 — End: 2012-03-06
  Administered 2012-03-06: 04:00:00 via ORAL

## 2012-03-05 MED ORDER — ENOXAPARIN 40 MG/0.4 ML SUB-Q SYRINGE
40 mg/0.4 mL | SUBCUTANEOUS | Status: DC
Start: 2012-03-05 — End: 2012-03-06
  Administered 2012-03-06: 15:00:00 via SUBCUTANEOUS

## 2012-03-05 MED ORDER — SODIUM CHLORIDE 0.9 % IJ SYRG
INTRAMUSCULAR | Status: DC | PRN
Start: 2012-03-05 — End: 2012-03-06

## 2012-03-05 MED ORDER — SODIUM CHLORIDE 0.9 % IJ SYRG
Freq: Three times a day (TID) | INTRAMUSCULAR | Status: DC
Start: 2012-03-05 — End: 2012-03-06
  Administered 2012-03-06: 19:00:00 via INTRAVENOUS

## 2012-03-05 MED ORDER — HYDROMORPHONE 2 MG TAB
2 mg | Freq: Four times a day (QID) | ORAL | Status: DC | PRN
Start: 2012-03-05 — End: 2012-03-06
  Administered 2012-03-06: 08:00:00 via ORAL

## 2012-03-05 MED ORDER — GENTAMICIN IN SALINE (ISO-OSMOTIC) 100 MG/50 ML IV PIGGY BACK
100 mg/50 mL | Freq: Once | INTRAVENOUS | Status: AC
Start: 2012-03-05 — End: 2012-03-05
  Administered 2012-03-05 (×2): via INTRAVENOUS

## 2012-03-05 MED ORDER — METRONIDAZOLE IN SODIUM CHLORIDE (ISO-OSM) 500 MG/100 ML IV PIGGY BACK
500 mg/100 mL | Freq: Once | INTRAVENOUS | Status: AC
Start: 2012-03-05 — End: 2012-03-05
  Administered 2012-03-05 (×2): via INTRAVENOUS

## 2012-03-05 MED ADMIN — hydrALAZINE (APRESOLINE) 20 mg/mL injection: INTRAVENOUS | @ 18:00:00 | NDC 00517090125

## 2012-03-05 MED ADMIN — lidocaine (PF) (XYLOCAINE) 10 mg/mL (1 %) injection soln 0.1 mL: SUBCUTANEOUS | @ 16:00:00 | NDC 63323049205

## 2012-03-05 MED ADMIN — midazolam (VERSED) injection: INTRAVENOUS | @ 17:00:00 | NDC 10019002837

## 2012-03-05 MED ADMIN — meperidine (DEMEROL) injection 12.5 mg: INTRAVENOUS | @ 20:00:00 | NDC 00409117630

## 2012-03-05 MED ADMIN — fentaNYL citrate (PF) injection: INTRAVENOUS | @ 19:00:00 | NDC 00409909332

## 2012-03-05 MED ADMIN — rocuronium (ZEMURON) injection: INTRAVENOUS | @ 18:00:00 | NDC 81565020402

## 2012-03-05 MED ADMIN — propofol (DIPRIVAN) 10 mg/mL injection: INTRAVENOUS | @ 17:00:00 | NDC 63323026920

## 2012-03-05 MED ADMIN — dexamethasone (DECADRON) 4 mg/mL injection: INTRAVENOUS | @ 17:00:00 | NDC 00517490525

## 2012-03-05 MED ADMIN — lactated ringers infusion: INTRAVENOUS | @ 19:00:00 | NDC 00338011704

## 2012-03-05 MED ADMIN — rocuronium (ZEMURON) injection: INTRAVENOUS | @ 18:00:00 | NDC 10139023505

## 2012-03-05 MED ADMIN — fentaNYL citrate (PF) injection: INTRAVENOUS | @ 17:00:00 | NDC 00409909332

## 2012-03-05 MED ADMIN — HYDROmorphone (PF) (DILAUDID) injection 2 mg: INTRAVENOUS | @ 23:00:00 | NDC 00409336501

## 2012-03-05 MED ADMIN — 0.9% sodium chloride infusion: INTRAVENOUS | @ 20:00:00 | NDC 00409798309

## 2012-03-05 MED ADMIN — hydrALAZINE (APRESOLINE) 20 mg/mL injection: INTRAVENOUS | @ 19:00:00 | NDC 00517090125

## 2012-03-05 MED ADMIN — glycopyrrolate (ROBINUL) injection: INTRAMUSCULAR | @ 19:00:00 | NDC 10019001639

## 2012-03-05 MED ADMIN — morphine injection: INTRAVENOUS | @ 18:00:00 | NDC 00409126130

## 2012-03-05 MED ADMIN — morphine injection 2 mg: INTRAVENOUS | @ 21:00:00 | NDC 00409189301

## 2012-03-05 MED ADMIN — ondansetron (ZOFRAN) injection: INTRAVENOUS | @ 19:00:00 | NDC 00143989105

## 2012-03-05 MED ADMIN — meperidine (DEMEROL) injection 12.5 mg: INTRAVENOUS | @ 20:00:00 | NDC 10019015944

## 2012-03-05 MED ADMIN — lactated ringers infusion: INTRAVENOUS | @ 16:00:00 | NDC 00409795309

## 2012-03-05 MED ADMIN — neostigmine (PROSTIGMINE) injection: INTRAVENOUS | @ 19:00:00 | NDC 00517003325

## 2012-03-05 MED ADMIN — rocuronium (ZEMURON) injection: INTRAVENOUS | @ 19:00:00 | NDC 10139023505

## 2012-03-05 MED ADMIN — sodium chloride (NS) flush 5-10 mL: INTRAVENOUS | @ 19:00:00 | NDC 87701099893

## 2012-03-05 MED ADMIN — fentaNYL citrate (PF) injection: INTRAVENOUS | @ 18:00:00 | NDC 00409909332

## 2012-03-05 MED ADMIN — HYDROmorphone (PF) (DILAUDID) injection 0.2 mg: INTRAVENOUS | @ 21:00:00 | NDC 00409255201

## 2012-03-05 MED ADMIN — HYDROmorphone (PF) (DILAUDID) injection 0.2 mg: INTRAVENOUS | @ 20:00:00 | NDC 00409255201

## 2012-03-05 MED ADMIN — 0.9% sodium chloride infusion: INTRAVENOUS | @ 16:00:00 | NDC 87701099893

## 2012-03-05 MED ADMIN — succinylcholine (ANECTINE) injection: INTRAVENOUS | @ 17:00:00 | NDC 00409662902

## 2012-03-05 MED ADMIN — lidocaine (PF) (XYLOCAINE) 20 mg/mL (2 %) injection: INTRAVENOUS | @ 17:00:00 | NDC 63323049697

## 2012-03-05 MED ADMIN — rocuronium (ZEMURON) injection: INTRAVENOUS | @ 17:00:00 | NDC 10139023505

## 2012-03-05 MED ADMIN — acetaminophen (OFIRMEV) infusion: INTRAVENOUS | @ 18:00:00 | NDC 43825010201

## 2012-03-05 MED ADMIN — ondansetron (ZOFRAN) injection 4 mg: INTRAVENOUS | @ 20:00:00 | NDC 00409475503

## 2012-03-05 MED ADMIN — lactated ringers infusion: INTRAVENOUS | @ 17:00:00 | NDC 00338011704

## 2012-03-05 MED FILL — INSULIN REGULAR HUMAN 100 UNIT/ML INJECTION: 100 unit/mL | INTRAMUSCULAR | Qty: 1

## 2012-03-05 MED FILL — OFIRMEV 1,000 MG/100 ML (10 MG/ML) INTRAVENOUS SOLUTION: 1000 mg/100 mL (10 mg/mL) | INTRAVENOUS | Qty: 100

## 2012-03-05 MED FILL — FENTANYL CITRATE (PF) 50 MCG/ML IJ SOLN: 50 mcg/mL | INTRAMUSCULAR | Qty: 5

## 2012-03-05 MED FILL — LACTATED RINGERS IV: INTRAVENOUS | Qty: 1000

## 2012-03-05 MED FILL — HYDROMORPHONE (PF) 1 MG/ML IJ SOLN: 1 mg/mL | INTRAMUSCULAR | Qty: 1

## 2012-03-05 MED FILL — XYLOCAINE-MPF 20 MG/ML (2 %) INJECTION SOLUTION: 20 mg/mL (2 %) | INTRAMUSCULAR | Qty: 80

## 2012-03-05 MED FILL — HYDRALAZINE 20 MG/ML IJ SOLN: 20 mg/mL | INTRAMUSCULAR | Qty: 15

## 2012-03-05 MED FILL — DILAUDID (PF) 2 MG/ML INJECTION SOLUTION: 2 mg/mL | INTRAMUSCULAR | Qty: 1

## 2012-03-05 MED FILL — ONDANSETRON (PF) 4 MG/2 ML INJECTION: 4 mg/2 mL | INTRAMUSCULAR | Qty: 4

## 2012-03-05 MED FILL — MORPHINE 10 MG/ML SYRINGE: 10 mg/mL | INTRAMUSCULAR | Qty: 1

## 2012-03-05 MED FILL — DIPRIVAN 10 MG/ML INTRAVENOUS EMULSION: 10 mg/mL | INTRAVENOUS | Qty: 200

## 2012-03-05 MED FILL — MEPERIDINE (PF) 25MG/ML INJECTION: 25 mg/mL | INTRAMUSCULAR | Qty: 1

## 2012-03-05 MED FILL — GENTAMICIN IN SALINE (ISO-OSMOTIC) 100 MG/50 ML IV PIGGY BACK: 100 mg/50 mL | INTRAVENOUS | Qty: 50

## 2012-03-05 MED FILL — GLYCOPYRROLATE 0.2 MG/ML IJ SOLN: 0.2 mg/mL | INTRAMUSCULAR | Qty: 0.8

## 2012-03-05 MED FILL — METRONIDAZOLE IN SODIUM CHLORIDE (ISO-OSM) 500 MG/100 ML IV PIGGY BACK: 500 mg/100 mL | INTRAVENOUS | Qty: 100

## 2012-03-05 MED FILL — ONDANSETRON (PF) 4 MG/2 ML INJECTION: 4 mg/2 mL | INTRAMUSCULAR | Qty: 2

## 2012-03-05 MED FILL — SODIUM CHLORIDE 0.9 % IV: INTRAVENOUS | Qty: 1000

## 2012-03-05 MED FILL — DEXAMETHASONE SODIUM PHOSPHATE 4 MG/ML IJ SOLN: 4 mg/mL | INTRAMUSCULAR | Qty: 4

## 2012-03-05 MED FILL — QUELICIN 20 MG/ML INJECTION SOLUTION: 20 mg/mL | INTRAMUSCULAR | Qty: 180

## 2012-03-05 MED FILL — ROCURONIUM 10 MG/ML IV: 10 mg/mL | INTRAVENOUS | Qty: 60

## 2012-03-05 MED FILL — OFIRMEV 1,000 MG/100 ML (10 MG/ML) INTRAVENOUS SOLUTION: 1000 mg/100 mL (10 mg/mL) | INTRAVENOUS | Qty: 1000

## 2012-03-05 MED FILL — MIDAZOLAM 1 MG/ML IJ SOLN: 1 mg/mL | INTRAMUSCULAR | Qty: 5

## 2012-03-05 MED FILL — NEOSTIGMINE METHYLSULFATE 1 MG/ML INJECTION: 1 mg/mL | INTRAMUSCULAR | Qty: 4

## 2012-03-05 NOTE — Op Note (Signed)
Gynecologic Oncology Operative Report    Jody [name]    Pre-operative dx: Endometrial cancer     Post-operative dx: Endometrial cancer     Procedure:  1) Total laparoscopic hysterectomy     2) Bilateral salpingo-oophorectomy     3) Laparoscopic pelvic lymphadenectomy     Surgeon: Annia Friendly, MD     Assistant: Lavella Lemons, PA-C     Anesthesia: GETA     EBL: <50 cc     Complications: None     Specimens: uterus, cervix, fallopian tubes, ovaries, pelvic washings, pelvic lymph nodes     Operative indications: 41 yo WF with heavy and irregular bleeding.  Endometrial biopsy demonstrated an endometrial carcinoma.  I recommended laparoscopic hysterectomy and staging.    Operative findings: Mildly enlarged uterus.  Normal tubes and ovaries.  No suspicious nodes.  No evidence of intraperitoneal disease.    Procedure in detail: After the risks, benefits, indications, and alternatives of the procedure were discussed with the patient and informed consent was obtained, the patient was taken to the operating room. She was positively identified, administered general anesthesia, and then placed in the dorsal lithotomy position in [place] stirrups. An exam under anesthesia was performed. She was then prepped and draped in the usual fashion. A foley catheter was inserted, followed by a V-Care uterine manipulator. We then proceeded with laparoscopy by grasping the umbilicus on either side with Allis clamps. A small incision was made at the base with an #11-blade scalpel. A 5 mm blade-less trochar was then directly inserted, with the camera in position to visualize safe entry. Once proper placement was confirmed, the abdomen was then insufflated with carbon dioxide. A 5 mm blade-less trochar was then inserted in each lower quadrant, midway between the anterior superior iliac spine and the umbilicus. These trochars were inserted under direct visualization to ensure safe entry. The abdomen and pelvis were then examined, with the above  mentioned findings noted. Pelvic washings were obtained as well. The patient was then placed in Trendelenburg tilt and we then proceeded with the hysterectomy. The left round ligament was identified, then coagulated and transected with the Harmonic Scalpel, allowing entry into the retroperitoneal space. The vesico-uterine peritoneum was divided with the Harmonic Scalpel from the left side across the midline, allowing visualization of the ring of the V-Care manipulator anteriorly. We then identified the left ureter and bluntly developed the perirectal space. The left uterine artery was identified at its origin off of the hyogastric artery, and it was coagulated and transected with the Harmonic Scalpel at that point, with the ureter being held on traction medially to ensure its safety. The left ovarian vessels were then isolated and then coagulated and transected with the Harmonic Scalpel. The posterior leaf of the broad ligament was then divided with the Harmonic Scalpel up to the level of the uterine body. We then directed our attention to the right side of the pelvis. The right round ligament was identified and then coagulated and transected with the Harmonic Scalpel, allowing entry into the retroperitoneal space. The remaining vesico-uterine peritoneum was then divided with the Harmonic Scalpel on the right side. The right ureter was then identified and the perirectal space was bluntly developed. The right uterine artery was then identified at its origin off of the hypogastric artery. It was coagulated and transected with the Harmonic Scalpel at that point, with the ureter being held on traction medially to ensure its safety. The right ovarian vessels were then isolated and then  coagulated and transected with the Harmonic Scalpel. The posterior leaf of the broad ligament was then divided with the Harmonic Scalpel up to the level of the uterine body. We then moved anteriorly and the bladder was sharply dissected  off of the lower uterine segment and cervix until it was well below the ring of the [employer]. The uterine arteries were then skeletonized bilaterally and then coagulated and transected with the Harmonic Scalpel at the level of the V-Care ring. A circumferential colpotomy was then performed by using the active blade of the Harmonic Scalpel to cut down onto the V-Care ring. Once the colpotomy was completed, the specimen was delivered transvaginally and sent to pathology. A bulb syringe was then placed into the vagina to maintain pneumoperitoneum for vaginal cuff closure. The cuff was then reapproximated with three figure-of-eight stitches of 2-0 PDS suture using the LSI RD-180 suturing device. The stitches were secured in place with the LSI Ti-Knot device. Excellent reapproximation and hemostasis was noted     We then directed our attention to the lymphadenectomy. An additional 12 mm trochar was then inserted in the suprapubic region. The perivesicle spaces were then developed bilaterally. The perirectal spaces were previously developed during the hysterectomy. We then proceeded with the lymph node dissection on the left side. The nodes were removed with a combination of sharp and blunt dissection using the Harmonic Scalpel for both dissection and hemostasis. The lateral border of dissection was the genitofemoral nerve, the medial border was the ureter and the superior vesicle artery, the distal border was the origin of the circumflex iliac vein, the proximal border was the bifurcation of the iliac vessels, the inferior border was the obturator nerve. The nodes were dissected as one bundle and placed in an Endocatch bag and delivered through the 12 mm trochar. We then performed a right sided lymph node dissection using the same technique and borders of dissection as on the right.  This bundle was also placed in an Endocatch bag and delivered through the 12 mm trochar.  This site was then closed with a 0  Vicryl suture at the fascial level using a Jari Favre closure device.       The pelvis was then irrigated and all pedicles inspected. Once satisfied with hemostasis, the gas was then allowed to escape from the abdomen and the trochars were removed. She was then taken out of Trendelenburg tilt. The incision sites were closed with a stitch of 4-0 Monocryl, followed by a layer of Dermabond. The bulb syringe was then removed from the vagina. The patient was taken out of stirrups, awakened from anesthesia, and taken to the recovery room in stable condition. All instrument, sponge, and needle counts were correct.     Domenica Fail, MD  03/05/2012  1:55 PM

## 2012-03-05 NOTE — Other (Signed)
FS[facility] 237

## 2012-03-05 NOTE — Other (Signed)
Patient: Jody [name] MRN: [id number]  SSN: [id number]   Date of Birth: 10-15-1970  Age: 41 y.o.  Sex: female     Patient is status post Procedure(s) with comments:  TOTAL LAPAROSCOPIC HYSTERECTOMY, BILATERL SALPINGO-OOPHERECTOMY, PELVIC LYMPHADNECTOMY .    Surgeon(s) and Role:     * Johnny Renea Ee., MD - Primary                      Peripheral IV 03/05/12 Right Wrist (Active)          Urinary Catheter 03/05/12 2- way;Foley (Active)   Criteria for Appropriate Use Use for selected surgical procedure 03/05/2012 12:43 PM   Status Draining;Patent 03/05/2012 12:43 PM   Site Condition No abnormalities 03/05/2012 12:43 PM   Drainage Tube Clipped to Bed No 03/05/2012 12:43 PM   Catheter Secured to Thigh No 03/05/2012 12:43 PM   Tamper Seal Intact Yes 03/05/2012 12:43 PM   Bag Below Bladder/Not on Floor Yes 03/05/2012 12:43 PM   Lack of Dependent Loop in Tubing Yes 03/05/2012 12:43 PM   Drainage Bag Less Than Half Full Yes 03/05/2012 12:43 PM   Sterile Solution Used for  Irrigation N/A 03/05/2012 12:43 PM                     Dressing/Packing:  Wound Abdomen-DRESSING TYPE: Topical skin adhesive/glue (03/05/12 1300)

## 2012-03-05 NOTE — Brief Op Note (Signed)
BRIEF OPERATIVE NOTE    Date of Procedure: 03/05/2012   Preoperative Diagnosis: ENDOMETRIAL CANCER   Postoperative Diagnosis: ENDOMETRIAL CANCER     Procedure(s):  LAPAROSCOPIC TOTAL HYSTERECTOMY, BILATERL SALPINGO-OOPHERECTOMY, PELVIC LYMPHADENECTOMY   Surgeon(s) and Role:     * Wendelin Bradt Renea Ee., MD - Primary  Anesthesia: General   Estimated Blood Loss: <50 cc  Specimens:   ID Type Source Tests Collected by Time Destination   1 : uterus, bilateral fallopian tubes, ovaries Fresh Uterus with Bilateral Fallopian tubes and Ovaries  Domenica Fail., MD 03/05/2012 1317 Pathology   2 : left pelvic lymph nodes Fresh Lymph Node  Domenica Fail., MD 03/05/2012 1327 Pathology   3 : right pelvic lymph nodes Fresh Lymph Node  Domenica Fail., MD 03/05/2012 1335 Pathology   1 : pelvic washings Fresh Other                  Domenica Fail., MD 03/05/2012 1249 Cytology      Findings: Mildly enlarged uterus.  Normal tubes and ovaries.  No suspicious lymph nodes. No intraperitoneal disease.   Complications: None    Domenica Fail, MD

## 2012-03-05 NOTE — Other (Signed)
Patient interviewed in holding area, identity and procedure verified.1000 ml 0.9% NaCl as irrigation. Husband updated as to start of surgery.

## 2012-03-05 NOTE — Op Note (Signed)
Gynecologic Oncology Operative Report    Jody [name]    Pre-operative dx: Endometrial cancer     Post-operative dx: Endometrial cancer     Procedure:  1) Total laparoscopic hysterectomy     2) Bilateral salpingo-oophorectomy     3) Laparoscopic pelvic lymphadenectomy     Surgeon: Wilgus Deyton Hyde, MD     Assistant: Sean Huiras, PA-C     Anesthesia: GETA     EBL: <50 cc     Complications: None     Specimens: uterus, cervix, fallopian tubes, ovaries, pelvic washings, pelvic lymph nodes     Operative indications: 41 yo WF with heavy and irregular bleeding.  Endometrial biopsy demonstrated an endometrial carcinoma.  I recommended laparoscopic hysterectomy and staging.    Operative findings: Mildly enlarged uterus.  Normal tubes and ovaries.  No suspicious nodes.  No evidence of intraperitoneal disease.    Procedure in detail: After the risks, benefits, indications, and alternatives of the procedure were discussed with the patient and informed consent was obtained, the patient was taken to the operating room. She was positively identified, administered general anesthesia, and then placed in the dorsal lithotomy position in [place] stirrups. An exam under anesthesia was performed. She was then prepped and draped in the usual fashion. A foley catheter was inserted, followed by a V-Care uterine manipulator. We then proceeded with laparoscopy by grasping the umbilicus on either side with Allis clamps. A small incision was made at the base with an #11-blade scalpel. A 5 mm blade-less trochar was then directly inserted, with the camera in position to visualize safe entry. Once proper placement was confirmed, the abdomen was then insufflated with carbon dioxide. A 5 mm blade-less trochar was then inserted in each lower quadrant, midway between the anterior superior iliac spine and the umbilicus. These trochars were inserted under direct visualization to ensure safe entry. The abdomen and pelvis were then examined, with the above  mentioned findings noted. Pelvic washings were obtained as well. The patient was then placed in Trendelenburg tilt and we then proceeded with the hysterectomy. The left round ligament was identified, then coagulated and transected with the Harmonic Scalpel, allowing entry into the retroperitoneal space. The vesico-uterine peritoneum was divided with the Harmonic Scalpel from the left side across the midline, allowing visualization of the ring of the V-Care manipulator anteriorly. We then identified the left ureter and bluntly developed the perirectal space. The left uterine artery was identified at its origin off of the hyogastric artery, and it was coagulated and transected with the Harmonic Scalpel at that point, with the ureter being held on traction medially to ensure its safety. The left ovarian vessels were then isolated and then coagulated and transected with the Harmonic Scalpel. The posterior leaf of the broad ligament was then divided with the Harmonic Scalpel up to the level of the uterine body. We then directed our attention to the right side of the pelvis. The right round ligament was identified and then coagulated and transected with the Harmonic Scalpel, allowing entry into the retroperitoneal space. The remaining vesico-uterine peritoneum was then divided with the Harmonic Scalpel on the right side. The right ureter was then identified and the perirectal space was bluntly developed. The right uterine artery was then identified at its origin off of the hypogastric artery. It was coagulated and transected with the Harmonic Scalpel at that point, with the ureter being held on traction medially to ensure its safety. The right ovarian vessels were then isolated and then   coagulated and transected with the Harmonic Scalpel. The posterior leaf of the broad ligament was then divided with the Harmonic Scalpel up to the level of the uterine body. We then moved anteriorly and the bladder was sharply dissected  off of the lower uterine segment and cervix until it was well below the ring of the V-Care manipulator. The uterine arteries were then skeletonized bilaterally and then coagulated and transected with the Harmonic Scalpel at the level of the V-Care ring. A circumferential colpotomy was then performed by using the active blade of the Harmonic Scalpel to cut down onto the V-Care ring. Once the colpotomy was completed, the specimen was delivered transvaginally and sent to pathology. A bulb syringe was then placed into the vagina to maintain pneumoperitoneum for vaginal cuff closure. The cuff was then reapproximated with three figure-of-eight stitches of 2-0 PDS suture using the LSI RD-180 suturing device. The stitches were secured in place with the LSI Ti-Knot device. Excellent reapproximation and hemostasis was noted     We then directed our attention to the lymphadenectomy. An additional 12 mm trochar was then inserted in the suprapubic region. The perivesicle spaces were then developed bilaterally. The perirectal spaces were previously developed during the hysterectomy. We then proceeded with the lymph node dissection on the left side. The nodes were removed with a combination of sharp and blunt dissection using the Harmonic Scalpel for both dissection and hemostasis. The lateral border of dissection was the genitofemoral nerve, the medial border was the ureter and the superior vesicle artery, the distal border was the origin of the circumflex iliac vein, the proximal border was the bifurcation of the iliac vessels, the inferior border was the obturator nerve. The nodes were dissected as one bundle and placed in an Endocatch bag and delivered through the 12 mm trochar. We then performed a right sided lymph node dissection using the same technique and borders of dissection as on the right.  This bundle was also placed in an Endocatch bag and delivered through the 12 mm trochar.  This site was then closed with a 0  Vicryl suture at the fascial level using a Carter Thomson closure device.       The pelvis was then irrigated and all pedicles inspected. Once satisfied with hemostasis, the gas was then allowed to escape from the abdomen and the trochars were removed. She was then taken out of Trendelenburg tilt. The incision sites were closed with a stitch of 4-0 Monocryl, followed by a layer of Dermabond. The bulb syringe was then removed from the vagina. The patient was taken out of stirrups, awakened from anesthesia, and taken to the recovery room in stable condition. All instrument, sponge, and needle counts were correct.     Dianara Smullen HYDE, JR, MD  03/05/2012  1:55 PM

## 2012-03-05 NOTE — Other (Signed)
TRANSFER - OUT REPORT:    Verbal report given Kem Kays nurse  Marshell Garfinkel (name) on Lavaughn A Medeiros  being transferred to 358(unit) for routine post - op       Report consisted of patient???s Situation, Background, Assessment and   Recommendations(SBAR).     Time Pre op antibiotic given:Flagyl 500 mg at 1225; Gent 100 mg at 1230  Anesthesia Stop time: 1423    Information from the following report(s) SBAR, OR Summary, Intake/Output and MAR was reviewed with the receiving nurse.    Opportunity for questions and clarification was provided.      The following personal items collected during your admission accompanied patient upon transfer:   Dental Appliance:    Vision:    Hearing Aid:    Jewelry:    Clothing: Clothing:  (CLOTHING BAG PLACED ON BED W/PATIENT)  Other Valuables:    Valuables sent to safe:      Patient verbalizes decrease in surgical pain since administration of pain meds in pacu.  Volunteer desk advised of transfer to room 358.    Updated Nurse Orson Aloe as to patient's [facility] and Dr. Berenice Primas orders related thereto.  1550 pm.

## 2012-03-05 NOTE — Anesthesia Pre-Procedure Evaluation (Signed)
Anesthetic History   No history of anesthetic complications           Review of Systems / Medical History  Patient summary reviewed, nursing notes reviewed and pertinent labs reviewed    Pulmonary          Asthma       Neuro/Psych         Psychiatric history     Cardiovascular  Within defined limits                   GI/Hepatic/Renal     GERD: well controlled             Endo/Other    Diabetes: type 2         Other Findings            Physical Exam    Airway  Mallampati: II  TM Distance: > 6 cm  Neck ROM: normal range of motion        Cardiovascular  Regular rate and rhythm,  S1 and S2 normal,  no murmur, click, rub, or gallop             Dental    Dentition: Lower dentition intact and Upper dentition intact     Pulmonary  Breath sounds clear to auscultation               Abdominal  GI exam deferred       Other Findings            Anesthetic Plan    ASA: 2  Anesthesia type: general          Induction: Intravenous  Anesthetic plan and risks discussed with: Patient

## 2012-03-05 NOTE — Anesthesia Post-Procedure Evaluation (Signed)
Post-Anesthesia Evaluation and Assessment    Patient: Jody [name] MRN: [id number]  SSN: [id number]    Date of Birth: November 30, 1970  Age: 41 y.o.  Sex: female       Cardiovascular Function/Vital Signs  Visit Vitals   Item Reading   ??? BP 128/57   ??? Pulse 78   ??? Temp 36.7 ??C (98 ??F)   ??? Resp 16   ??? Ht 5' 5.5" (1.664 m)   ??? Wt 113.399 kg (250 lb)   ??? BMI 40.95 kg/m2   ??? SpO2 92%       Patient is status post General anesthesia for Procedure(s):  TOTAL LAPAROSCOPIC HYSTERECTOMY, BILATERL SALPINGO-OOPHERECTOMY, PELVIC LYMPHADNECTOMY .    Nausea/Vomiting: None    Postoperative hydration reviewed and adequate.    Pain:  Pain Scale 1: Numeric (0 - 10) (03/05/12 1100)  Pain Intensity 1: 8 (03/05/12 1443)   Managed    Neurological Status:   Neuro (WDL): Within Defined Limits (03/05/12 1433)   At baseline    Mental Status and Level of Consciousness: Alert and oriented to person, place, and time    Pulmonary Status:   O2 Device: Nasal cannula (03/05/12 1433)   Adequate oxygenation and airway patent    Complications related to anesthesia: None    Post-anesthesia assessment completed. No concerns    Signed By: Jorge Mandril, MD     March 05, 2012

## 2012-03-06 LAB — CBC W/O DIFF
HCT: 36.8 % (ref 35.0–47.0)
HGB: 12.7 g/dL (ref 11.5–16.0)
MCH: 29.1 PG (ref 26.0–34.0)
MCHC: 34.5 g/dL (ref 30.0–36.5)
MCV: 84.4 FL (ref 80.0–99.0)
PLATELET: 233 10*3/uL (ref 150–400)
RBC: 4.36 M/uL (ref 3.80–5.20)
RDW: 16.1 % — ABNORMAL HIGH (ref 11.5–14.5)
WBC: 9.6 10*3/uL (ref 3.6–11.0)

## 2012-03-06 LAB — METABOLIC PANEL, BASIC
Anion gap: 8 mmol/L (ref 5–15)
BUN/Creatinine ratio: 20 (ref 12–20)
BUN: 9 MG/DL (ref 6–20)
CO2: 24 MMOL/L (ref 21–32)
Calcium: 8 MG/DL — ABNORMAL LOW (ref 8.5–10.1)
Chloride: 102 MMOL/L (ref 97–108)
Creatinine: 0.46 MG/DL (ref 0.45–1.15)
GFR est AA: >60 mL/min/{1.73_m2} (ref >60)
GFR est non-AA: >60 mL/min/{1.73_m2} (ref >60)
Glucose: 167 MG/DL — ABNORMAL HIGH (ref 65–100)
Potassium: 3.9 MMOL/L (ref 3.5–5.1)
Sodium: 134 MMOL/L — ABNORMAL LOW (ref 136–145)

## 2012-03-06 LAB — GLUCOSE, POC
Glucose (POC): 185 mg/dL — ABNORMAL HIGH (ref 65–105)
Glucose (POC): 175 mg/dL — ABNORMAL HIGH (ref 65–105)
Glucose (POC): 191 mg/dL — ABNORMAL HIGH (ref 65–105)
Glucose (POC): 178 mg/dL — ABNORMAL HIGH (ref 65–105)

## 2012-03-06 MED ORDER — HYDROMORPHONE 2 MG TAB
2 mg | ORAL | Status: DC | PRN
Start: 2012-03-06 — End: 2012-03-06
  Administered 2012-03-06: 19:00:00 via ORAL

## 2012-03-06 MED ORDER — ENOXAPARIN 40 MG/0.4 ML SUB-Q SYRINGE
40 mg/0.4 mL | INJECTION | SUBCUTANEOUS | Status: DC
Start: 2012-03-06 — End: 2012-06-26

## 2012-03-06 MED ORDER — HYDROMORPHONE 2 MG TAB
2 mg | ORAL | Status: DC | PRN
Start: 2012-03-06 — End: 2012-03-06
  Administered 2012-03-06 (×2): via ORAL

## 2012-03-06 MED ORDER — HYDROMORPHONE 2 MG TAB
2 mg | ORAL | Status: DC | PRN
Start: 2012-03-06 — End: 2012-03-06

## 2012-03-06 MED ORDER — HYDROMORPHONE 2 MG TAB
2 mg | ORAL_TABLET | ORAL | Status: AC | PRN
Start: 2012-03-06 — End: 2012-03-15

## 2012-03-06 MED ADMIN — HYDROmorphone (PF) (DILAUDID) injection 2 mg: INTRAVENOUS | @ 01:00:00 | NDC 00409336501

## 2012-03-06 MED ADMIN — 0.9% sodium chloride infusion: INTRAVENOUS | @ 04:00:00 | NDC 00409798309

## 2012-03-06 MED ADMIN — sertraline (ZOLOFT) tablet 50 mg: ORAL | @ 15:00:00 | NDC 68084018111

## 2012-03-06 MED FILL — OXYCODONE-ACETAMINOPHEN 10 MG-325 MG TAB: 10-325 mg | ORAL | Qty: 1

## 2012-03-06 MED FILL — HYDROMORPHONE 2 MG TAB: 2 mg | ORAL | Qty: 1

## 2012-03-06 MED FILL — ALPRAZOLAM 0.25 MG TAB: 0.25 mg | ORAL | Qty: 1

## 2012-03-06 MED FILL — LOVENOX 40 MG/0.4 ML SUBCUTANEOUS SYRINGE: 40 mg/0.4 mL | SUBCUTANEOUS | Qty: 0.4

## 2012-03-06 MED FILL — INSULIN REGULAR HUMAN 100 UNIT/ML INJECTION: 100 unit/mL | INTRAMUSCULAR | Qty: 1

## 2012-03-06 MED FILL — SERTRALINE 50 MG TAB: 50 mg | ORAL | Qty: 1

## 2012-03-06 MED FILL — LORAZEPAM 2 MG/ML IJ SOLN: 2 mg/mL | INTRAMUSCULAR | Qty: 1

## 2012-03-06 MED FILL — DILAUDID (PF) 2 MG/ML INJECTION SOLUTION: 2 mg/mL | INTRAMUSCULAR | Qty: 1

## 2012-03-06 NOTE — Progress Notes (Signed)
[facility]  [address], [address]  [place], [place] [address]  [phone] FAX [phone]  Kathe Mariner, MD   Annia Friendly, MD      Jody [name] DOB: April 12, 1970    Admitted: 03/05/2012       SUBJECTIVE:    Complains of pain this morning.    OBJECTIVE:    BP 143/68   Pulse 74   Temp(Src) 98.4 ??F (36.9 ??C)   Resp 18   Ht 5' 5.5" (1.664 m)   Wt 250 lb (113.399 kg)   BMI 40.95 kg/m2   SpO2 94%   LMP 08/15/2011      Intake/Output Summary (Last 24 hours) at 03/06/12 0732  Last data filed at 03/06/12 6962   Gross per 24 hour   Intake 4174.17 ml   Output   1355 ml   Net 2819.17 ml       Physical Exam     General: alert, cooperative   Cardiac: Regular rate and rhythm       Lungs: clear to auscultation bilaterally   Abdomen: soft, non-tender, without masses or organomegaly   Wound: clean, dry, no drainage   Extremity: extremities normal, atraumatic, no cyanosis or edema       Lab Results   Component Value Date/Time    WBC 9.6 03/06/2012  3:33 AM    ABS. NEUTROPHILS 4.7 02/16/2012  8:15 PM    HGB 12.7 03/06/2012  3:33 AM    HCT 36.8 03/06/2012  3:33 AM    PLATELET 233 03/06/2012  3:33 AM     Lab Results   Component Value Date/Time    NA 134* 03/06/2012  3:33 AM    K 3.9 03/06/2012  3:33 AM    CL 102 03/06/2012  3:33 AM    CO2 24 03/06/2012  3:33 AM    GLU 167* 03/06/2012  3:33 AM    BUN 9 03/06/2012  3:33 AM    CREA 0.46 03/06/2012  3:33 AM    GFRAA >60 03/06/2012  3:33 AM    GFRNA >60 03/06/2012  3:33 AM    CA 8.0* 03/06/2012  3:33 AM       IMPRESSION/PLAN:    POD #1 s/p TLH, BSO, PLND    Oncologic: Endometrial carcinoma.  Await final pathology.      Heme/CV: Hemodynamically stable.   Renal: Good UOP.       FEN/GI: Diet as tolerated.   ID/Wound: Afebrile.   PPX: Lovenox.   Dispostion: Doing well postoperatively.  Will d/c home today.       Domenica Fail, MD

## 2012-03-06 NOTE — Progress Notes (Signed)
I have reviewed discharge instructions with the patient.  The patient verbalized understanding.

## 2012-03-06 NOTE — Progress Notes (Signed)
Chart reviewed for medical necessity and discharge needs. MTurgeon RN, CRM

## 2012-03-12 NOTE — Telephone Encounter (Signed)
Please call

## 2012-03-12 NOTE — Telephone Encounter (Signed)
Pt calling for appt due to post op problems

## 2012-03-13 NOTE — Progress Notes (Signed)
Pt states she is having pain with urination or when having a BM.  Hurts when sitting.  Feels belly button does not look right.  Feels a knot when wiping after urination.

## 2012-03-13 NOTE — Progress Notes (Signed)
[facility]YN[facility]COLOG[facility]  [address]  [place], [place] [address]  [phone]  FAX [phone]  Dr. Laurena Bering, III    Dr. Annia Friendly, Carmelina Dane, [facility]    Postoperative Office Note  Patient ID:  Name: Jody [name]  MRM: [id number]  DOB: 09-04-1970/41 y.o.  Date: 03/13/2012    Diagnosis:   1. Malignant neoplasm of corpus uteri, except isthmus Endometrioid Adenocarcinoma         Problem List:   Patient Active Problem List   Diagnosis Code   ??? Depression 311   ??? Hyperlipidemia 272.4   ??? Diabetes mellitus 250.00   ??? GERD (gastroesophageal reflux disease) 530.81   ??? 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 Obesity, Class III, BMI 40-49.9 (morbid obesity) 278.01       SUBJECTIVE:    Jody [name] is a 41 y.o. female who presents for followup postoperative care following a TLH, BSO, PLND.     Her pathology revealed:    FINAL PATHOLOGIC DIAGNOSIS  Uterus, cervix, and bilateral ovaries and fallopian tubes, hysterectomy and bilateral salpingo-oophorectomies:  Invasive well-differentiated endometrioid adenocarcinoma  See synoptic report  Synoptic Report:  Specimen: Uterus, cervix, bilateral ovaries and fallopian tubes  Procedure: Total abdominal hysterectomy and bilateral salpingo-oophorectomies  Lymph node sampling: Bilateral pelvic lymph nodes  Specimen integrity: Intact hysterectomy specimen  Tumor size: 6.9 cm  Histologic type: Endometrioid adenocarcinoma  Histologic grade: FIGO grade 1  Myometrial invasion: Present  Depth of invasion: 2 mm  Myometrial thickness: 30 mm  Involvement of cervix: Not involved  Extent of involvement of other organs:  Right ovary: Not involved  Left ovary: Not involved  Right fallopian tube: Not involved  Left fallopian tube: Not involved  Lymphovascular invasion: Not identified  Additional pathologic findings: Leiomyomata  Pathologic staging (pTNM):  Primary tumor (pT): 1a  Regional lymph nodes (pN): 0  Pelvic lymph  nodes:  Number examined: 14  Number involved: 0  Para-aortic lymph nodes:  Number examined: NA  Distant metastasis (M): X  2. Left pelvic lymph nodes, excision  9 lymph nodes, no carcinoma identified (0/9)  3. Right pelvic lymph nodes, excision  5 lymph nodes, no carcinoma identified (0/5)    Currently she is complaining of abdominal and pelvic pain.  Pain is worse with urination and bowel movements.  Appetite is fair. Eating a regular diet without difficulty.   Bowel movements are regular.  Pain is not well controlled.  Medication(s) being used: narcotic analgesics including Dilaudid PO.  States she can't take NSAIDs.    Medications:     Current Outpatient Prescriptions on File Prior to Visit   Medication Sig Dispense Refill   ??? HYDROmorphone (DILAUDID) 2 mg tablet Take 1-2 Tabs by mouth every four (4) hours as needed for Pain for 9 days.  50 Tab  0   ??? sertraline (ZOLOFT) 50 mg tablet TAKE ONE TABLET BY MOUTH EVERY DAY  30 Tab  0   ??? ALPRAZolam (XANAX) 0.25 mg tablet Take 1 Tab by mouth three (3) times daily as needed for Anxiety.  30 Tab  0   ??? promethazine (PHENERGAN) 25 mg tablet Take 1 Tab by mouth every six (6) hours as needed for Nausea.  30 Tab  0   ??? metFORMIN (GLUCOPHAGE) 1,000 mg tablet Take 1 Tab by mouth two (2) times daily (with meals).  60 Tab  1   ???  glipiZIDE (GLUCOTROL) 5 mg tablet Take 1 Tab by mouth two (2) times a day.  60 Tab  0   ??? enoxaparin (LOVENOX) 40 mg/0.4 mL 40 mg by SubCUTAneous route every twenty-four (24) hours.  10 Syringe  0   ??? ferrous sulfate (IRON) 325 mg (65 mg iron) tablet Take  by mouth Daily (before breakfast).         No current facility-administered medications on file prior to visit.     Allergies:     Allergies   Allergen Reactions   ??? Erythromycin Nausea and Vomiting and Swelling   ??? Levaquin (Levofloxacin) Rash and Swelling   ??? Macrolide Antibiotics Rash, Nausea and Vomiting and Swelling   ??? Nsaids (Non-Steroidal Anti-Inflammatory Drug) Other (comments)     Per pt, was  told by GI d/t stomach ulcers   ??? Pcn (Penicillins) Nausea and Vomiting and Swelling   ??? Ultram (Tramadol) Nausea Only       OBJECTIVE:    Vitals:   Filed Vitals:    03/13/12 1528   BP: 133/76   Pulse: 83   Height: 5' 5.5" (1.664 m)   Weight: 250 lb (113.399 kg)     Physical Examination:     General:  alert, cooperative; she appears uncomfortable   Lungs: lungs clear to auscultation   Cardiac: Regular rate and rhythm   Abdomen: soft, bowel sounds active, non-tender  No CVA tenderness   Incision: healing well   Pelvic: External genitalia: normal general appearance  Vaginal: normal without tenderness, induration or masses  Cervix: absent  Adnexa: removed surgically  Uterus: absent   Rectal: not done   Extremity:   extremities normal, atraumatic, no cyanosis or edema     IMPRESSION:    Jody [name] is .  She has a diagnosis of stage IA, grade 1 endometrial carcinoma.    Medical problems:     Patient Active Problem List   Diagnosis Code   ??? Depression 311   ??? Hyperlipidemia 272.4   ??? Diabetes mellitus 250.00   ??? GERD (gastroesophageal reflux disease) 530.81   ??? 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 Obesity, Class III, BMI 40-49.9 (morbid obesity) 278.01         PLAN:    The operative procedures and clinical results have been reviewed with the patient.  Final pathology and operative findings reviewed.   Implications of diagnosis discussed at length.  All questions answered.  Observation only.  No additional therapy recommended.  The patient may return to work after her 4 week post-op check.    As for her pain, I am going to treat her with a short course of 800 mg ibuprofen.  She does not have a true allergy.  I explained to her that she needs to take it with food to reduce any potential GI toxicity.    I will see the patient back in 3 weeks for her regular post-op check. The patient is advised to call our office with any problems or concerns.    Domenica Fail, MD   03/13/2012/3:30 PM

## 2012-03-18 NOTE — Telephone Encounter (Signed)
Please call

## 2012-03-18 NOTE — Telephone Encounter (Signed)
Pt c/o leg pain.  Wants to make sure it's nothing severe like a blood clot before she goes to the er. Please call

## 2012-03-20 ENCOUNTER — Encounter

## 2012-03-20 NOTE — Telephone Encounter (Signed)
Per Dr. Sheppard Plumber pt to have doppler studies ordered.

## 2012-03-21 ENCOUNTER — Encounter

## 2012-03-21 MED ORDER — HYDROCODONE-ACETAMINOPHEN 5 MG-325 MG TAB
5-325 mg | ORAL_TABLET | ORAL | Status: DC | PRN
Start: 2012-03-21 — End: 2012-06-23

## 2012-03-21 MED ORDER — CEPHALEXIN 500 MG CAP
500 mg | ORAL_CAPSULE | Freq: Four times a day (QID) | ORAL | Status: AC
Start: 2012-03-21 — End: 2012-03-31

## 2012-03-23 NOTE — Procedures (Signed)
[facility]  *** FINAL REPORT ***    Name: Jody [name], Jody [name]  MRN: [id number]  DOB: January 09, 1971  HIS Order #: [id number]  TRAKnet Visit #: [id number]  Date: 21 Mar 2012    TYPE OF TEST: Peripheral Venous Testing    REASON FOR TEST  Pain in limb, Limb swelling, Patient c/o right groin pain, tenderness,   swelling post surgery    Right Leg:-  Deep venous thrombosis:           No  Superficial venous thrombosis:    No  Deep venous insufficiency:        Not examined  Superficial venous insufficiency: Not examined    Left Leg:-  Deep venous thrombosis:           No  Superficial venous thrombosis:    No  Deep venous insufficiency:        Not examined  Superficial venous insufficiency: Not examined      INTERPRETATION/FINDINGS  PROCEDURE:  Evaluation of lower extremity veins with ultrasound  (B-mode imaging, pulsed Doppler, color Doppler).  Includes the common  femoral, deep femoral, femoral, popliteal, posterior tibial, peroneal,   and great saphenous veins.  Other veins, for example the  gastrocnemius and soleal veins, may also be visualized.    FINDINGS:         Right: Veins are compressible and there is no narrowing of the  flow channel on color Doppler imaging.  Phasic venous flow is  demonstrated.There is an incidental finding of prominent groin lymph  nodes.         Left:   Veins are compressible and there is no narrowing of the  flow channel on color Doppler imaging.  Phasic venous flow is  demonstrated.    IMPRESSION:  No evidence of right or left lower extremity vein  thrombosis. Enlarged right groin lymph nodes, as described above.    ADDITIONAL COMMENTS    Verbal preliminary called to Dr. Berenice Primas office by Alcario Drought at  10:05am.    I have personally reviewed the data relevant to the interpretation of  this  study.    TECHNOLOGIST: Alcario Drought, RVT, RDMS  Signed: 03/21/2012 10:28 AM    PHYSICIAN: Gaetana Michaelis, MD  Signed: 03/23/2012 07:00 AM

## 2012-03-23 NOTE — Procedures (Signed)
[facility]  *** FINAL REPORT ***    Name: Jody [name], Jody [name]  MRN: [id number]  DOB: 08 Jan 1971  HIS Order #: [id number]  TRAKnet Visit #: [id number]  Date: 21 Mar 2012    TYPE OF TEST: Peripheral Venous Testing    REASON FOR TEST  Pain in limb, Limb swelling, Patient c/o right groin pain, tenderness,   swelling post surgery    Right Leg:-  Deep venous thrombosis:           No  Superficial venous thrombosis:    No  Deep venous insufficiency:        Not examined  Superficial venous insufficiency: Not examined    Left Leg:-  Deep venous thrombosis:           No  Superficial venous thrombosis:    No  Deep venous insufficiency:        Not examined  Superficial venous insufficiency: Not examined      INTERPRETATION/FINDINGS  PROCEDURE:  Evaluation of lower extremity veins with ultrasound  (B-mode imaging, pulsed Doppler, color Doppler).  Includes the common  femoral, deep femoral, femoral, popliteal, posterior tibial, peroneal,   and great saphenous veins.  Other veins, for example the  gastrocnemius and soleal veins, may also be visualized.    FINDINGS:         Right: Veins are compressible and there is no narrowing of the  flow channel on color Doppler imaging.  Phasic venous flow is  demonstrated.There is an incidental finding of prominent groin lymph  nodes.         Left:   Veins are compressible and there is no narrowing of the  flow channel on color Doppler imaging.  Phasic venous flow is  demonstrated.    IMPRESSION:  No evidence of right or left lower extremity vein  thrombosis. Enlarged right groin lymph nodes, as described above.    ADDITIONAL COMMENTS    Verbal preliminary called to Dr. Hyde's office by Lisa Stover at  10:05am.    I have personally reviewed the data relevant to the interpretation of  this  study.    TECHNOLOGIST: Lisa Stover, RVT, RDMS  Signed: 03/21/2012 10:28 AM    PHYSICIAN: Tiawanna Luchsinger, MD  Signed: 03/23/2012 07:00 AM

## 2012-04-04 ENCOUNTER — Telehealth

## 2012-04-04 NOTE — Telephone Encounter (Signed)
OK to refill zoloft and xanax

## 2012-04-04 NOTE — Telephone Encounter (Signed)
Pt called to inform that the medication for depression is not working for her.  Plz call pt to advise on this matter and discuss.  Pt not sure what Dr Hendericks wants her to do about depression med.  Plz contact pt when Isaias Sakai is ready for p/u.      Thanks

## 2012-04-10 NOTE — Progress Notes (Signed)
[facility] GYNECOLOGIC ONCOLOGY  [address]  [place], [place] [address]  [phone]  FAX [phone]  Dr. Laurena Bering, III    Dr. Annia Friendly, Carmelina Dane, [facility]    Postoperative Office Note  Patient ID:  Name: Jody [name]  MRM: [id number]  DOB: 07-28-1970/41 y.o.  Date: 04/10/2012    Diagnosis:   1. Malignant neoplasm of corpus uteri, except isthmus Endometrioid Adenocarcinoma     2. Obesity, Class III, BMI 40-49.9 (morbid obesity)        Problem List:   Patient Active Problem List   Diagnosis Code   ??? Depression 311   ??? Hyperlipidemia 272.4   ??? Diabetes mellitus 250.00   ??? GERD (gastroesophageal reflux disease) 530.81   ??? 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 Obesity, Class III, BMI 40-49.9 (morbid obesity) 278.01       SUBJECTIVE:    Jody [name] is a 42 y.o. female who presents for followup postoperative care following a TLH, BSO, PLND on 03/05/12.     Her pathology revealed:    FINAL PATHOLOGIC DIAGNOSIS  Uterus, cervix, and bilateral ovaries and fallopian tubes, hysterectomy and bilateral salpingo-oophorectomies:  Invasive well-differentiated endometrioid adenocarcinoma  See synoptic report  Synoptic Report:  Specimen: Uterus, cervix, bilateral ovaries and fallopian tubes  Procedure: Total abdominal hysterectomy and bilateral salpingo-oophorectomies  Lymph node sampling: Bilateral pelvic lymph nodes  Specimen integrity: Intact hysterectomy specimen  Tumor size: 6.9 cm  Histologic type: Endometrioid adenocarcinoma  Histologic grade: FIGO grade 1  Myometrial invasion: Present  Depth of invasion: 2 mm  Myometrial thickness: 30 mm  Involvement of cervix: Not involved  Extent of involvement of other organs:  Right ovary: Not involved  Left ovary: Not involved  Right fallopian tube: Not involved  Left fallopian tube: Not involved  Lymphovascular invasion: Not identified  Additional pathologic findings: Leiomyomata  Pathologic staging (pTNM):  Primary  tumor (pT): 1a  Regional lymph nodes (pN): 0  Pelvic lymph nodes:  Number examined: 14  Number involved: 0  Para-aortic lymph nodes:  Number examined: NA  Distant metastasis (M): X  2. Left pelvic lymph nodes, excision  9 lymph nodes, no carcinoma identified (0/9)  3. Right pelvic lymph nodes, excision  5 lymph nodes, no carcinoma identified (0/5)    I saw her in the office less than 2 weeks after surgery complaining of abdominal and pelvic pain.  Pain was worse with urination and bowel movements.  Appetite is fair. Eating a regular diet without difficulty.   Bowel movements are regular.  Pain is significantly better than when I last saw her.  Medication(s) being used: narcotic analgesics including Dilaudid PO.  States she can't take NSAIDs.    Medications:     Current Outpatient Prescriptions on File Prior to Visit   Medication Sig Dispense Refill   ??? sertraline (ZOLOFT) 50 mg tablet TAKE ONE TABLET BY MOUTH EVERY DAY  30 Tab  0   ??? promethazine (PHENERGAN) 25 mg tablet Take 1 Tab by mouth every six (6) hours as needed for Nausea.  30 Tab  0   ??? metFORMIN (GLUCOPHAGE) 1,000 mg tablet Take 1 Tab by mouth two (2) times daily (with meals).  60 Tab  1   ??? glipiZIDE (GLUCOTROL) 5 mg tablet Take 1 Tab by mouth two (2) times a day.  60 Tab  0   ??? HYDROcodone-acetaminophen (NORCO) 5-325 mg per  tablet Take 1 Tab by mouth every four (4) hours as needed for Pain.  30 Tab  0   ??? ibuprofen (MOTRIN) 800 mg tablet Take 1 Tab by mouth every eight (8) hours as needed for Pain.  90 Tab  0   ??? enoxaparin (LOVENOX) 40 mg/0.4 mL 40 mg by SubCUTAneous route every twenty-four (24) hours.  10 Syringe  0   ??? ferrous sulfate (IRON) 325 mg (65 mg iron) tablet Take  by mouth Daily (before breakfast).         No current facility-administered medications on file prior to visit.     Allergies:     Allergies   Allergen Reactions   ??? Erythromycin Nausea and Vomiting and Swelling   ??? Levaquin (Levofloxacin) Rash and Swelling   ??? Macrolide  Antibiotics Rash, Nausea and Vomiting and Swelling   ??? Nsaids (Non-Steroidal Anti-Inflammatory Drug) Other (comments)     Per pt, was told by GI d/t stomach ulcers   ??? Pcn (Penicillins) Nausea and Vomiting and Swelling   ??? Ultram (Tramadol) Nausea Only       OBJECTIVE:    Vitals:   Filed Vitals:    04/10/12 0926   BP: 145/78   Pulse: 58   Height: 5' 5.5" (1.664 m)   Weight: 257 lb 9.6 oz (116.847 kg)     Physical Examination:     General:  alert, cooperative; she appears uncomfortable   Lungs: lungs clear to auscultation   Cardiac: Regular rate and rhythm   Abdomen: soft, bowel sounds active, non-tender  No CVA tenderness   Incision: healing well   Pelvic: External genitalia: normal general appearance  Vaginal: normal without tenderness, induration or masses  Cervix: absent  Adnexa: removed surgically  Uterus: absent   Rectal: not done   Extremity:   extremities normal, atraumatic, no cyanosis or edema     IMPRESSION:    Jody [name] is .  She has a diagnosis of stage IA, grade 1 endometrial carcinoma.    Medical problems:     Patient Active Problem List   Diagnosis Code   ??? Depression 311   ??? Hyperlipidemia 272.4   ??? Diabetes mellitus 250.00   ??? GERD (gastroesophageal reflux disease) 530.81   ??? 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 Obesity, Class III, BMI 40-49.9 (morbid obesity) 278.01         PLAN:    The operative procedures and clinical results have been reviewed with the patient.  Final pathology and operative findings reviewed.   Implications of diagnosis discussed at length.  All questions answered.  Observation only.  No additional therapy recommended.    I will see the patient back in 3 months for her first surveillance visit. The patient is advised to call our office with any problems or concerns.    Domenica Fail, MD  04/10/2012/3:30 PM

## 2012-04-11 ENCOUNTER — Encounter

## 2012-04-11 NOTE — Telephone Encounter (Signed)
Pt took her last pill for Glipizide today.

## 2012-04-22 NOTE — Progress Notes (Signed)
Still feeling anxious  Having chest pains -- thinks that it is reflux  Stress   SOB with her chest pains  Also complaining of acid reflux    Had recent cardiac echocardiogram and ECG which were normal    Recent gyn surgery for endometrial cancer    + hot flashes  Also complaining of acne    Not able to afford some of her medications    Discussed increasing dose of zoloft  Discussed that xanax is addictive, and should be using zoloft and may still need to increase dosage    PE:  General -- awake, alert  Lungs -- clear bilaterally, symmetrical movement  CV -- regular, S1S2    Assessment:  Anxiety  GERD  Depression  Hx endometrial cancer -- s/p hysterectomy and BSO  Diabetes type 2  obesity    Plan:  Increase zoloft to 100 mg a day  Increase dosage of xanax to 0.5 mg TID prn -- discouraged use and to let increased dosage of zoloft help  Spoke with nurse navigator about getting pt a rx for PPI -- filled out paperwork for Nexium  Labs   mammogram  F/U in 3 months

## 2012-04-22 NOTE — Progress Notes (Signed)
Chief Complaint   Patient presents with   ??? Labs   ??? Diabetes   ??? Medication Evaluation     depression medicine      Labs for diabetes and discuss depression medication.Reviewed record in preparation for visit and have obtained necessary documentation.

## 2012-04-23 ENCOUNTER — Encounter

## 2012-04-23 LAB — CBC WITH AUTOMATED DIFF
ABS. BASOPHILS: 0 10*3/uL (ref 0.0–0.2)
ABS. EOSINOPHILS: 0.4 10*3/uL (ref 0.0–0.4)
ABS. IMM. GRANS.: 0 10*3/uL (ref 0.0–0.1)
ABS. MONOCYTES: 0.4 10*3/uL (ref 0.1–0.9)
ABS. NEUTROPHILS: 3.6 10*3/uL (ref 1.4–7.0)
Abs Lymphocytes: 1.8 10*3/uL (ref 0.7–3.1)
BASOPHILS: 0 % (ref 0–3)
EOSINOPHILS: 7 % — ABNORMAL HIGH (ref 0–5)
HCT: 41.3 % (ref 34.0–46.6)
HGB: 13.6 g/dL (ref 11.1–15.9)
IMMATURE GRANULOCYTES: 0 % (ref 0–2)
Lymphocytes: 28 % (ref 14–46)
MCH: 29.2 pg (ref 26.6–33.0)
MCHC: 32.9 g/dL (ref 31.5–35.7)
MCV: 89 fL (ref 79–97)
MONOCYTES: 7 % (ref 4–12)
NEUTROPHILS: 58 % (ref 40–74)
PLATELET: 258 10*3/uL (ref 155–379)
RBC: 4.65 x10E6/uL (ref 3.77–5.28)
RDW: 14.1 % (ref 12.3–15.4)
WBC: 6.2 10*3/uL (ref 3.4–10.8)

## 2012-04-23 LAB — METABOLIC PANEL, COMPREHENSIVE
A-G Ratio: 1.7 (ref 1.1–2.5)
ALT (SGPT): 30 IU/L (ref 0–32)
AST (SGOT): 22 IU/L (ref 0–40)
Albumin: 3.9 g/dL (ref 3.5–5.5)
Alk. phosphatase: 79 IU/L (ref 39–117)
BUN/Creatinine ratio: 20 (ref 9–23)
BUN: 12 mg/dL (ref 6–24)
Bilirubin, total: 0.3 mg/dL (ref 0.0–1.2)
CO2: 23 mmol/L (ref 19–28)
Calcium: 8.8 mg/dL (ref 8.7–10.2)
Chloride: 102 mmol/L (ref 97–108)
Creatinine: 0.6 mg/dL (ref 0.57–1.00)
GFR est AA: 130 mL/min/{1.73_m2} (ref >59)
GFR est non-AA: 113 mL/min/{1.73_m2} (ref >59)
GLOBULIN, TOTAL: 2.3 g/dL (ref 1.5–4.5)
Glucose: 137 mg/dL — ABNORMAL HIGH (ref 65–99)
Potassium: 4.5 mmol/L (ref 3.5–5.2)
Protein, total: 6.2 g/dL (ref 6.0–8.5)
Sodium: 141 mmol/L (ref 134–144)

## 2012-04-23 LAB — CVD REPORT: PDF IMAGE: 0

## 2012-04-23 LAB — LIPID PANEL
Cholesterol, total: 160 mg/dL (ref 100–199)
HDL Cholesterol: 56 mg/dL (ref >39)
LDL, calculated: 83 mg/dL (ref 0–99)
Triglyceride: 106 mg/dL (ref 0–149)
VLDL, calculated: 21 mg/dL (ref 5–40)

## 2012-04-23 LAB — TSH 3RD GENERATION: TSH: 0.012 u[IU]/mL — ABNORMAL LOW (ref 0.450–4.500)

## 2012-04-23 LAB — HEMOGLOBIN A1C WITH EAG: Hemoglobin A1c: 6.9 % — ABNORMAL HIGH (ref 4.8–5.6)

## 2012-04-23 NOTE — Progress Notes (Signed)
Quick Note:    Normal cholesterol level  No anemia  HgbA1C is ok -- would not make any changes in your medications for now  Need to do an additional test to check your thyroid levels  Please schedule lab appt to have this done  ______

## 2012-04-23 NOTE — Telephone Encounter (Signed)
Pharmacy is requesting clarification on the Rx for Esomeprazole Magnesium whether the strength should be 20 or 40 mg.  Please call back to clarify [phone].

## 2012-04-25 ENCOUNTER — Encounter

## 2012-04-25 NOTE — Progress Notes (Signed)
Quick Note:    Negative mammogram    ______

## 2012-04-28 ENCOUNTER — Encounter

## 2012-05-01 NOTE — Telephone Encounter (Signed)
lz call pharmacy regarding script approved on 04/28/12 for Esomeprazole Magnesium .  Pharmacist needs correct dosage.      Thanks,

## 2012-05-01 NOTE — Addendum Note (Signed)
Addended by: Laruth Bouchard on: 05/01/2012 02:39 PM     Modules accepted: Orders

## 2012-05-12 ENCOUNTER — Encounter

## 2012-05-12 NOTE — Telephone Encounter (Signed)
[phone]    Patient called for refill of xanax.  Stated has been out of this medication for two weeks but wants it filled today as she really needs it.

## 2012-05-13 NOTE — Telephone Encounter (Signed)
Chief Complaint   Patient presents with   ??? Medication Refill     Called numbers listed one number is not in service and the other one states mailbox is full and un able to leave a voice message.Reviewed record in preparation for visit and have obtained necessary documentation.

## 2012-05-13 NOTE — Telephone Encounter (Signed)
Has not been a month since rx refilled

## 2012-05-14 ENCOUNTER — Telehealth

## 2012-05-14 NOTE — Telephone Encounter (Signed)
Chief Complaint   Patient presents with   ??? Medication Refill   ??? Medication Evaluation     Patient came in today asking about xanax refill i explained that it was way to early for a refill. The patient would also like a refill on her diabetes medication and wants to know if the zoloft can be increased because it is just not working.  Diabetes medication was given with refill last month  Reviewed record in preparation for visit and have obtained necessary documentation.

## 2012-05-19 MED ADMIN — HYDROcodone-acetaminophen (NORCO) 5-325 mg per tablet 1 Tab: ORAL | @ 22:00:00 | NDC 68084036811

## 2012-05-19 NOTE — ED Notes (Signed)
The patient was discharged  home in stable condition, accompanied by family . The patient is alert and oriented, is in no respiratory distress and has vital signs within normal limits . The patient's diagnosis, condition and treatment were explained to patient or parent/guardian. The patient/responsible party expressed understanding.  Prescriptions were given to pt. No work/school note given to pt. A discharge plan has been developed. A case manager was not involved in the process. Aftercare instructions were given to the patient.

## 2012-05-19 NOTE — ED Provider Notes (Signed)
Patient is a 41 y.o. female presenting with knee pain and ankle pain. The history is provided by the patient.   Knee Pain   This is a new problem. Episode onset: 4 days ago  The problem occurs constantly. The problem has been gradually worsening. Pain location: left ankle and left knee  Quality: burning and stabbing  The pain is at a severity of 9/10. The pain is mild. Associated symptoms include limited range of motion. Pertinent negatives include no numbness, no tingling and no neck pain. The symptoms are aggravated by movement and palpation. She has tried OTC pain medications for the symptoms. There has been a history of trauma.   Ankle Pain   This is a new problem. Episode onset: 4 days. The problem occurs constantly. The problem has been gradually improving. The pain is at a severity of 9/10. Associated symptoms include limited range of motion. Pertinent negatives include no numbness, no tingling and no neck pain. The symptoms are aggravated by movement and palpation. She has tried OTC pain medications for the symptoms. There has been a history of trauma.   Pt states that she was pushing a car out of the snow and slipped on the ice. Pt states that she fell directly on her knee. She has never hurt this knee before. No numbness.     Pt no longer uses tobacco   Pt does not drink ETOH      Past Medical History   Diagnosis Date   ??? Diabetes    ??? Asthma    ??? Endometrial thickening on ultra sound    ??? Diabetic neuropathy      Per pt diagnosed by a physician a [facility]   ??? Lumbar disc disease    ??? Other ill-defined conditions      high cholesterol   ??? Psychiatric disorder      depression, anxiety   ??? 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 GERD (gastroesophageal reflux disease)         Past Surgical History   Procedure Laterality Date   ??? Hx orthopaedic       back   ??? Hx appendectomy     ??? Pr hysteroscopy,w/endo bx N/A 02/06/2012     OPERATIVE HYSTEROSCOPY, RESECTION SUBMUCOSSAL FIBROIDS performed by Eyvonne Mechanic, MD at [facility] MAIN OR   ??? Hx tubal ligation  1994   ??? Hx gyn  03/05/12     TLH/BSO/Lymphadenectomy         Family History   Problem Relation Age of Onset   ??? Seizures Mother    ??? Seizures Father    ??? Diabetes Mother    ??? Asthma Mother    ??? High Cholesterol Mother    ??? Heart Attack Mother    ??? Hypertension Father    ??? Cancer Father      throat cancer   ??? Heart Attack Father    ??? Hypertension Sister    ??? Hypertension Paternal Grandmother    ??? Diabetes Paternal Grandmother    ??? Breast Cancer Paternal Grandmother    ??? Cancer Paternal Grandfather      leukemia        History     Social History   ??? Marital Status: MARRIED     Spouse Name: N/A     Number of Children: N/A   ??? Years of Education: N/A     Occupational History   ??? Not on file.  Social History Main Topics   ??? Smoking status: Former Smoker -- 10 years     Quit date: 07/11/2010   ??? Smokeless tobacco: Never Used   ??? Alcohol Use: No   ??? Drug Use: No   ??? Sexually Active: Yes -- Female partner(s)     Birth Control/ Protection: Surgical      Comment: BTL 19 years ago     Other Topics Concern   ??? Not on file     Social History Narrative   ??? No narrative on file                  ALLERGIES: Erythromycin; Levaquin; Macrolide antibiotics; Nsaids (non-steroidal anti-inflammatory drug); Pcn; and Ultram      Review of Systems   Constitutional: Negative for fever and chills.   HENT: Negative for neck pain.    Respiratory: Negative for chest tightness, shortness of breath and wheezing.    Cardiovascular: Negative for chest pain and palpitations.   Gastrointestinal: Negative for vomiting and diarrhea.   Genitourinary: Negative for flank pain.   Skin: Negative for rash.   Neurological: Negative for tingling and numbness.   All other systems reviewed and are negative.        Filed Vitals:    05/19/12 1636   BP: 137/63   Pulse: 80   Temp: 98 ??F (36.7 ??C)   Resp: 18   Height: 5' 5.5" (1.664 m)   Weight: 112.946 kg (249 lb)   SpO2: 98%            Physical Exam   Nursing note and  vitals reviewed.  Constitutional: She is oriented to person, place, and time. She appears well-nourished. No distress.   HENT:   Head: Normocephalic and atraumatic.   Right Ear: External ear normal.   Left Ear: External ear normal.   Nose: Nose normal.   Mouth/Throat: Oropharynx is clear and moist. No oropharyngeal exudate.   Eyes: Conjunctivae and EOM are normal. Pupils are equal, round, and reactive to light. Right eye exhibits no discharge. Left eye exhibits no discharge. No scleral icterus.   Neck: Normal range of motion. Neck supple.   Cardiovascular: Normal rate, regular rhythm, normal heart sounds and intact distal pulses.  Exam reveals no gallop and no friction rub.    No murmur heard.  Pulmonary/Chest: Effort normal and breath sounds normal. No respiratory distress.   Abdominal: Soft. Bowel sounds are normal. She exhibits no distension and no mass. There is no tenderness. There is no rebound and no guarding.   Musculoskeletal: She exhibits no edema.        Left hip: Normal.        Left knee: She exhibits decreased range of motion and swelling (contusion noted ). She exhibits no laceration and no erythema. Tenderness (generalized ) found.        Left ankle: She exhibits no swelling. Tenderness (generalized ). Achilles tendon normal.   Neurological: She is alert and oriented to person, place, and time. She has normal strength. No cranial nerve deficit. She exhibits normal muscle tone.   Skin: Skin is warm and dry. No rash noted. She is not diaphoretic.   Psychiatric: She has a normal mood and affect. Her behavior is normal.        MDM     Amount and/or Complexity of Data Reviewed:    Discuss the patient with another provider:  Yes   Independant visualization of image, tracing, or specimen:  Yes  Procedures    Progress note:  Discussed the negative xrays. Will place in knee immobilizer and have follow up with ortho     1. Fall  2. Knee pain   3. Ankle sprain       Patient's results have been reviewed with  them.  Patient and/or family have verbally conveyed their understanding and agreement of the patient's signs, symptoms, diagnosis, treatment and prognosis and additionally agree to follow up as recommended or return to the Emergency Room should their condition change prior to follow-up.  Discharge instructions have also been provided to the patient with some educational information regarding their diagnosis as well a list of reasons why they would want to return to the ER prior to their follow-up appointment should their condition change.     Have spoken with attending physician about pt complaint and history. Agrees with plan of care in the emergency room.

## 2012-05-19 NOTE — ED Notes (Signed)
Pt reports she fell last Friday while assisting with the movement of a car. Pt reports pain in left knee and ankle, pt reports + swelling no bruising at this time.

## 2012-05-19 NOTE — ED Provider Notes (Signed)
I was personally available for consultation in the emergency department.  I have reviewed the chart and agree with the documentation recorded by the MLP, including the assessment, treatment plan, and disposition.  Tamica Covell R Ramir Malerba, MD

## 2012-05-30 NOTE — Telephone Encounter (Signed)
Want to know when her Zoloft & Xanax will be filled.... Called in earlier last week about refilling this med & she want to know when it will be filled... Please call.Marland KitchenMarland KitchenMarland Kitchen

## 2012-06-02 NOTE — Telephone Encounter (Signed)
Rx's ready and pt came by to pick them up

## 2012-06-23 LAB — D-DIMER, QUANTITATIVE: D-Dimer, Quant: 0.33 mg/L FEU (ref 0.00–0.65)

## 2012-06-23 LAB — CBC WITH AUTOMATED DIFF
ABS. BASOPHILS: 0 10*3/uL (ref 0.0–0.1)
ABS. EOSINOPHILS: 0.2 10*3/uL (ref 0.0–0.4)
ABS. LYMPHOCYTES: 1.7 10*3/uL (ref 0.8–3.5)
ABS. MONOCYTES: 0.4 10*3/uL (ref 0.0–1.0)
ABS. NEUTROPHILS: 8.4 10*3/uL — ABNORMAL HIGH (ref 1.8–8.0)
BASOPHILS: 0 % (ref 0–1)
EOSINOPHILS: 2 % (ref 0–7)
HCT: 40.3 % (ref 35.0–47.0)
HGB: 14.4 g/dL (ref 11.5–16.0)
LYMPHOCYTES: 16 % (ref 12–49)
MCH: 30.4 PG (ref 26.0–34.0)
MCHC: 35.7 g/dL (ref 30.0–36.5)
MCV: 85 FL (ref 80.0–99.0)
MONOCYTES: 4 % — ABNORMAL LOW (ref 5–13)
NEUTROPHILS: 78 % — ABNORMAL HIGH (ref 32–75)
PLATELET: 231 10*3/uL (ref 150–400)
RBC: 4.74 M/uL (ref 3.80–5.20)
RDW: 12.7 % (ref 11.5–14.5)
WBC: 10.7 10*3/uL (ref 3.6–11.0)

## 2012-06-23 LAB — D DIMER: D-dimer: 0.33 mg/L FEU (ref 0.00–0.65)

## 2012-06-23 MED ADMIN — sodium chloride 0.9 % bolus infusion 1,000 mL: INTRAVENOUS | NDC 00409798309

## 2012-06-23 NOTE — ED Provider Notes (Signed)
HPI Comments: Jody [name] is a 42yo female with pmhx significant for diabetes, uterine adenocarcinoma,asthma, gerd who presents to the ED for one week of CP. She states she has had intermittent palpitations and pain in on L side. She had THO+BSO in Dec, and has been menopausal since. She had a doppler done and they saw lymph nodes and Dr Sheppard Plumber put her on Lovenox but she did not refill the prescription as she had no money. Denies coughing up blood,recent URI,  Sob, cardiac workup of any sort. She also picked up smoking in last month.  Her knee pain has been going>6 weeks. She was seen here in feb. She reports she fell and injured it. Never followed up. No back pain, no incontinence. Used a brace and pain meds.Pt states that she was pushing a car out of the snow and slipped on the ice. Pt states that she fell directly on her knee. Ambulating w/o trouble. Pain worse with flexion. Pin point tenderness, worse with movement. No redness or calf pain.  Mammogram in dec. She had one day of nipple discharge but never  Said anything to Dr Sheppard Plumber. Mammogram was nl as per pt. No more discharge.    Patient is a 42 y.o. female presenting with chest pain and knee pain. The history is provided by the patient.   Chest Pain (Angina)   This is a new problem. The current episode started more than 1 week ago. The problem has been gradually worsening. The problem occurs constantly. The pain is associated with exertion. The quality of the pain is described as sharp. The pain does not radiate. Associated symptoms include irregular heartbeat and palpitations. Pertinent negatives include no abdominal pain, no back pain, no claudication, no cough, no diaphoresis, no dizziness, no exertional chest pressure, no fever, no headaches, no hemoptysis, no leg pain, no lower extremity edema, no malaise/fatigue, no nausea, no near-syncope, no numbness, no PND, no shortness of breath, no sputum production, no vomiting and no weakness. She has tried nothing for the  symptoms. Risk factors include smoking/tobacco exposure, diabetes mellitus, obesity, recent surgery and postmenopause. Her past medical history is significant for cancer and DM.Her past medical history does not include DVT.   Knee Pain   This is a recurrent problem. The current episode started more than 1 week ago. The problem occurs constantly. The problem has not changed since onset.Pertinent negatives include no numbness, no back pain and no neck pain.        Past Medical History   Diagnosis Date   ??? Diabetes    ??? Asthma    ??? Endometrial thickening on ultra sound    ??? Diabetic neuropathy      Per pt diagnosed by a physician a [facility]   ??? Lumbar disc disease    ??? Other ill-defined conditions      high cholesterol   ??? Psychiatric disorder      depression, anxiety   ??? 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 GERD (gastroesophageal reflux disease)         Past Surgical History   Procedure Laterality Date   ??? Hx orthopaedic       back   ??? Hx appendectomy     ??? Pr hysteroscopy,w/endo bx N/A 02/06/2012     OPERATIVE HYSTEROSCOPY, RESECTION SUBMUCOSSAL FIBROIDS performed by Eyvonne Mechanic, MD at [facility] MAIN OR   ??? Hx tubal ligation  1994   ??? Hx gyn  03/05/12  TLH/BSO/Lymphadenectomy         Family History   Problem Relation Age of Onset   ??? Seizures Mother    ??? Seizures Father    ??? Diabetes Mother    ??? Asthma Mother    ??? High Cholesterol Mother    ??? Heart Attack Mother    ??? Hypertension Father    ??? Cancer Father      throat cancer   ??? Heart Attack Father    ??? Hypertension Sister    ??? Hypertension Paternal Grandmother    ??? Diabetes Paternal Grandmother    ??? Breast Cancer Paternal Grandmother    ??? Cancer Paternal Grandfather      leukemia        History     Social History   ??? Marital Status: MARRIED     Spouse Name: N/A     Number of Children: N/A   ??? Years of Education: N/A     Occupational History   ??? Not on file.     Social History Main Topics   ??? Smoking status: Former Smoker -- 10 years     Quit date:  07/11/2010   ??? Smokeless tobacco: Never Used   ??? Alcohol Use: No   ??? Drug Use: No   ??? Sexually Active: Yes -- Female partner(s)     Birth Control/ Protection: Surgical      Comment: BTL 19 years ago     Other Topics Concern   ??? Not on file     Social History Narrative   ??? No narrative on file                  ALLERGIES: Erythromycin; Levaquin; Macrolide antibiotics; Nsaids (non-steroidal anti-inflammatory drug); Pcn; and Ultram      Review of Systems   Constitutional: Negative.  Negative for fever, malaise/fatigue and diaphoresis.   HENT: Negative for ear pain, congestion, neck pain, postnasal drip, tinnitus and ear discharge.    Eyes: Negative for photophobia, pain, discharge and visual disturbance.   Respiratory: Negative for apnea, cough, hemoptysis, sputum production, chest tightness, shortness of breath and wheezing.    Cardiovascular: Positive for chest pain and palpitations. Negative for claudication, leg swelling, PND and near-syncope.   Gastrointestinal: Negative for nausea, vomiting, abdominal pain, blood in stool and abdominal distention.   Genitourinary: Negative for dysuria, frequency, hematuria, flank pain and difficulty urinating.   Musculoskeletal: Negative for myalgias, back pain, joint swelling and gait problem.   Skin: Negative for color change and pallor.   Neurological: Negative for dizziness, syncope, weakness, numbness and headaches.   Psychiatric/Behavioral: Negative for behavioral problems and confusion. The patient is not nervous/anxious.        Filed Vitals:    06/23/12 1915 06/23/12 1948   BP: 152/78    Pulse: 73 78   Temp: 97.7 ??F (36.5 ??C)    Resp: 19 27   Height: 5' 5.5" (1.664 m)    Weight: 112.946 kg (249 lb)    SpO2: 99% 95%            Physical Exam   Nursing note and vitals reviewed.  Constitutional: She is oriented to person, place, and time. She appears well-developed and well-nourished.   HENT:   Head: Normocephalic and atraumatic.   Mouth/Throat: Oropharynx is clear and moist. No  oropharyngeal exudate.   Eyes: Pupils are equal, round, and reactive to light.   Neck: Normal range of motion.   Cardiovascular: Normal rate and  regular rhythm.  Exam reveals no friction rub.    No murmur heard.  Pulmonary/Chest: Effort normal and breath sounds normal.   Abdominal: Soft. There is no tenderness.   Musculoskeletal: She exhibits no edema and no tenderness.        Left hip: Normal. She exhibits no tenderness, no bony tenderness and no swelling.        Left knee: She exhibits decreased range of motion. She exhibits no swelling, no effusion, no deformity, no laceration and no erythema. Tenderness found. Medial joint line tenderness noted.        Left ankle: Normal.   Neurological: She is alert and oriented to person, place, and time.   Skin: Skin is warm and dry.   Psychiatric: She has a normal mood and affect.        MDM     Differential Diagnosis; Clinical Impression; Plan:     Seen and examined  Labs, cxr and ekg. Pt is high risk for cardiac/pulm events: recent surgery,started smoking, menopausal since Dec, Ca hx  Satori Krabill A Nyoka Alcoser, PA    GLucose is 287 on CMP, CK and Trop wnl  Pro BNP 424  CTA pending  Roberto Romanoski A Darlinda Bellows, PA    CTA neg. Labs wnl. Glucose reviewed. Pt had ARBY's right before arrival. CO2 31. Pt denies N/V/Abdominal pain  Will continue discharge with ortho follow up and pcp follow up  Danni Shima A Sua Spadafora, PA        Amount and/or Complexity of Data Reviewed:   Clinical lab tests:  Ordered and reviewed  Tests in the radiology section of CPT??:  Ordered and reviewed  Tests in the medicine section of the CPT??:  Ordered and reviewed  Discussion of test results with the performing providers:  Yes   Review and summarize past medical records:  No  Progress:   Patient progress:  Stable      Procedures    Patient has been reassessed.  Feeling much better.  Reviewed labs, medications and radiographics with patient.  Ready to discharge home.    Kennon Rounds, PA    Discussed case with attending Physician Rodolph Bong.  Agrees  with care and will D/C with follow up.    Lileigh Fahringer A Burnis Kaser, PA      Patient's results have been reviewed with them.  Patient and/or family have verbally conveyed their understanding and agreement of the patient's signs, symptoms, diagnosis, treatment and prognosis and additionally agree to follow up as recommended or return to the Emergency Room should their condition change prior to follow-up.  Discharge instructions have also been provided to the patient with some educational information regarding their diagnosis as well a list of reasons why they would want to return to the ER prior to their follow-up appointment should their condition change.  Jimmey Hengel Brooke Dare, PA

## 2012-06-23 NOTE — ED Notes (Signed)
C/o mid  chest pain X 1 week, pain is intermittent, pt also c/o knee pain that has been constant since February, pt has not followed up with orthopedist

## 2012-06-23 NOTE — ED Notes (Signed)
The patient was discharged home in stable condition, accompanied by family member. The patient is alert and oriented, is in no respiratory distress and has vital signs within normal limits. The patient's diagnosis, condition and treatment were explained to patient or parent/guardian by MD and reinforced by nurse. The patient and family party expressed understanding of discharge instructions, prescriptions, and plan of care. No work/school note given to pt. A discharge plan has been developed. A case manager was not involved in the process Patient offered a wheelchair to ED lobby for discharge but declined at this time. Patient ambulatory to ED lobby to go home with family member.

## 2012-06-23 NOTE — ED Provider Notes (Signed)
I was personally available for consultation in the emergency department.  I have reviewed the chart and agree with the documentation recorded by the MLP, including the assessment, treatment plan, and disposition.  Trent Gabler, MD

## 2012-06-24 LAB — METABOLIC PANEL, COMPREHENSIVE
A-G Ratio: 1 — ABNORMAL LOW (ref 1.1–2.2)
ALT (SGPT): 41 U/L (ref 12–78)
AST (SGOT): 24 U/L (ref 15–37)
Albumin: 3.3 g/dL — ABNORMAL LOW (ref 3.5–5.0)
Alk. phosphatase: 110 U/L (ref 50–136)
Anion gap: 8 mmol/L (ref 5–15)
BUN/Creatinine ratio: 9 — ABNORMAL LOW (ref 12–20)
BUN: 7 MG/DL (ref 6–20)
Bilirubin, total: 0.2 MG/DL (ref 0.2–1.0)
CO2: 31 mmol/L (ref 21–32)
Calcium: 8.4 MG/DL — ABNORMAL LOW (ref 8.5–10.1)
Chloride: 103 mmol/L (ref 97–108)
Creatinine: 0.75 MG/DL (ref 0.45–1.15)
GFR est AA: >60 mL/min/{1.73_m2} (ref >60)
GFR est non-AA: >60 mL/min/{1.73_m2} (ref >60)
Globulin: 3.4 g/dL (ref 2.0–4.0)
Glucose: 287 mg/dL — ABNORMAL HIGH (ref 65–100)
Potassium: 3.7 mmol/L (ref 3.5–5.1)
Protein, total: 6.7 g/dL (ref 6.4–8.2)
Sodium: 142 mmol/L (ref 136–145)

## 2012-06-24 LAB — URINALYSIS W/ REFLEX CULTURE
Bacteria: NEGATIVE /hpf
Bilirubin: NEGATIVE
Blood: NEGATIVE
Glucose: >1000 mg/dL — AB
Ketone: NEGATIVE mg/dL
Leukocyte Esterase: NEGATIVE
Nitrites: NEGATIVE
Protein: NEGATIVE mg/dL
Specific gravity: 1.02 (ref 1.003–1.030)
Urobilinogen: 0.2 EU/dL (ref 0.2–1.0)
pH (UA): 6.5 (ref 5.0–8.0)

## 2012-06-24 LAB — GLUCOSE, POC: Glucose (POC): 224 mg/dL — ABNORMAL HIGH (ref 65–105)

## 2012-06-24 LAB — CK W/ REFLX CKMB: CK: 61 U/L (ref 26–192)

## 2012-06-24 LAB — TROPONIN I: Troponin-I, Qt.: <0.04 ng/mL (ref <0.05)

## 2012-06-24 LAB — NT-PRO BNP: NT pro-BNP: 424 PG/ML — ABNORMAL HIGH (ref 0–125)

## 2012-06-24 MED ADMIN — ioversol (OPTIRAY) 350 mg iodine/mL contrast solution 100 mL: INTRAVENOUS | @ 01:00:00 | NDC 00019133311

## 2012-06-24 MED ADMIN — morphine injection 2 mg: INTRAVENOUS | @ 01:00:00 | NDC 00409189001

## 2012-06-25 NOTE — Telephone Encounter (Signed)
Please tell patient that neurologists don't really evaluate chest pains.  She would need to talk with her PCP or cardiologist in that regard.      Also, let her know that the testing we did (EEG, heart echocardiogram, ultrasound of the neck arteries) were all normal

## 2012-06-25 NOTE — Telephone Encounter (Signed)
cenvanet message

## 2012-06-25 NOTE — Telephone Encounter (Signed)
Message copied by Urban Gibson on Wed Jun 25, 2012 11:12 AM  ------       Message from: Renae Gloss, PIER D       Created: Wed Jun 25, 2012 10:22 AM       Regarding: Dr. Hendricks/Telephone         Patient was discharged from [facility] ER on 06/23/2012. They did a CT scan with contrast and wanted her to discontinue taking her Metformin and possibly schedule to have blood tests taken for her kidneys. Her number is [phone].  ------

## 2012-06-25 NOTE — Telephone Encounter (Signed)
Called and spoke to patient and relayed per Dr Gerri Lins  Please tell patient that neurologists don't really evaluate chest pains. She would need to talk with her PCP or cardiologist in that regard.   Also, let her know that the testing we did (EEG, heart echocardiogram, ultrasound of the neck arteries) were all normal          Patient stated understanding

## 2012-06-25 NOTE — Telephone Encounter (Signed)
Pt returned your call.

## 2012-06-25 NOTE — Telephone Encounter (Signed)
Attempted to call [phone] (M)  [phone] (H)  Unsuccessful message left on both voicemail asking for a return call

## 2012-06-25 NOTE — Telephone Encounter (Signed)
DR.ASGHAR I'm having a hard time getting a appointment but I've been having really bad chest pains and the hospital did a ct scan with contrast at the [facility] center and they said they didn't see any blood clots but it's still hurting what do you think I should do? I'm wondering if anything showed up on any of the test I took?    Please advise

## 2012-06-26 LAB — EKG, 12 LEAD, INITIAL
Atrial Rate: 71 {beats}/min
Calculated P Axis: 46 degrees
Calculated R Axis: 62 degrees
Calculated T Axis: 59 degrees
Diagnosis: NORMAL
P-R Interval: 152 ms
Q-T Interval: 398 ms
QRS Duration: 92 ms
QTC Calculation (Bezet): 432 ms
Ventricular Rate: 71 {beats}/min

## 2012-06-26 NOTE — Progress Notes (Signed)
"  Reviewed records in preparation for visit and have obtained necessary documentation."

## 2012-06-26 NOTE — Telephone Encounter (Signed)
Pt seen at [facility].

## 2012-06-26 NOTE — Progress Notes (Signed)
HISTORY OF PRESENT ILLNESS  Jody [name] is a 42 y.o. female.  HPI  Patient presents to the office to get established. She states she is a patient on Dr. Antony Haste. She states she thought she was seeing the orthopedic doctor today for her her. She states she fell in the snow about 8 weeks ago. She has been to the doctor and the ER a few times for evaluation. She was given the name of an orthopedic doctor to see. The doctor did not take the care card so she then was told we could see her here. She has been having diffiuclty with walking due to pain of the knee. She states bending the knee makes it feel like it is about to exploded. She has tried heat and using a brace without improvement. She is not able to take NSAID's because of her stomach history.    Also while here she would like to know why her chest is hurting. She has been having intermttent left side chest pain for over a week. She had CTA and chest xray at the ED. Testing came back WNL. She states the pain still is coming and going. She denies shortness of breath but admits that sometimes it feels like it causes tingling into her fingers.  Review of Systems   HENT: Negative.    Eyes: Negative.    Respiratory: Negative for shortness of breath.    Cardiovascular: Positive for chest pain. Negative for leg swelling.   Gastrointestinal: Negative.    Genitourinary: Negative.    Musculoskeletal: Positive for joint pain.   Skin: Negative.    Neurological: Negative.  Negative for weakness.   Endo/Heme/Allergies: Negative.    Psychiatric/Behavioral: Negative.      Blood pressure 138/102, pulse 68, temperature 98.1 ??F (36.7 ??C), temperature source Oral, resp. rate 16, height 5\' 5"  (1.651 m), weight 249 lb (112.946 kg), last menstrual period 08/15/2011, SpO2 97.00%.    Physical Exam   Constitutional:   Obese, pleasant.   Cardiovascular: Normal rate and regular rhythm.    No murmur heard.  Pulmonary/Chest: Effort normal and breath sounds normal. She exhibits  tenderness.   Mild anterior chest wall tenderness noted left side.   Musculoskeletal: She exhibits edema and tenderness.   Left knee- tenderness noted anterior knee medial aspect. ROM decreased due to increase pain. Mild edema noted superiorly of the knee.       ASSESSMENT and PLAN  Jody [name] was seen today for establish care, referral / consult and chest pain.    Diagnoses and associated orders for this visit:    Chest pain  - AMB POC EKG ROUTINE W/ 12 LEADS, INTER & REP  - REFERRAL TO CARDIOLOGY    Left knee pain  - REFERRAL TO SPORTS MEDICINE    Other Orders  - omeprazole (PRILOSEC) 20 mg capsule; Take 20 mg by mouth daily.      explained to patient that we do not have sports medicine doctors here. I did give her a referral to see Dr. Sanda Klein. He is located at her office.  I would like for her to see the cardiologist to make sure her compliant is not cardiac related. She is presenting like she may have costochondritis but because of her GERD and ulcer history NSAID's are not appropriate for her. She will follow up with Dr. Robby Sermon at her regular scheduled time.

## 2012-07-01 LAB — AMB POC HEMOGLOBIN A1C: Hemoglobin A1c (POC): 8 %

## 2012-07-01 LAB — AMB POC GLUCOSE BLOOD, BY GLUCOSE MONITORING DEVICE: Glucose POC: 324 mg/dL

## 2012-07-01 NOTE — Progress Notes (Signed)
Reviewed Record in preparation for the visit and have obtained the necessary documentation.    Pt questionnaire is scanned in.

## 2012-07-01 NOTE — Progress Notes (Signed)
Jody [name] is a 41 y.o.  female  Issues discussed today include:    Follow up of chronic conditions, see diagnoses    Signs and symptoms:  Chest wall pain  Duration:  months  Context:  Had EGD/colonoscopy 05/2012 per patient  Location:  sternal  Quality:  charp  Severity:  Can be severe  Timing:  Comes and goes  Modifying factors:  I can reproduce pain with palpation of chest wall c/w costochondritis  EKG today normal    She struggles with Chronic pain and anxiety    Has seen numerous MDs recently    Last documented visit in EMR 06/26/2012, EKG normal    CTA 3/24:  1. No evidence of pulmonary embolus.   2. Clear lungs.    CXR:  3/24 normal    troponins 3/24 negative    Data reviewed or ordered today:  PMP:  Numerous BNZ and pain meds from numerous MDs:  Last pain pill 3/24, last BNZ 04/22/2012 on PMP but in our EMR #60 2/25 for xanax    Other problems include:  Patient Active Problem List   Diagnosis Code   ??? Depression 311   ??? Hyperlipidemia 272.4   ??? Diabetes mellitus 250.00   ??? GERD (gastroesophageal reflux disease) 530.81   ??? 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 HYDROcodone-acetaminophen (NORCO) 5-325 mg per tablet Take 1 Tab by mouth every eight (8) hours as needed for Pain.  12 Tab  0   ??? omeprazole (PRILOSEC) 20 mg capsule Take 20 mg by mouth daily.       ??? sertraline (ZOLOFT) 100 mg tablet TAKE ONE TABLET BY MOUTH EVERY DAY  30 Tab  0   ??? esomeprazole (NEXIUM) 20 mg capsule Take 1 Cap by mouth daily.  30 Cap  6   ??? glipiZIDE (GLUCOTROL) 5 mg tablet Take 1 Tab by mouth two (2) times a day.  60 Tab  1   ??? metFORMIN (GLUCOPHAGE) 1,000 mg tablet Take 1 Tab by mouth two (2) times daily (with meals).  60 Tab  1   ??? ibuprofen (MOTRIN) 800 mg tablet Take 1 Tab by mouth every eight (8) hours as needed for Pain.  90 Tab  0    ??? ferrous sulfate (IRON) 325 mg (65 mg iron) tablet Take  by mouth Daily (before breakfast).           Allergies:  Allergies   Allergen Reactions   ??? Erythromycin Nausea and Vomiting and Swelling   ??? Levaquin (Levofloxacin) Rash and Swelling   ??? Macrolide Antibiotics Rash, Nausea and Vomiting and Swelling   ??? Nsaids (Non-Steroidal Anti-Inflammatory Drug) Other (comments)     Per pt, was told by GI d/t stomach ulcers   ??? Pcn (Penicillins) Nausea and Vomiting and Swelling   ??? Ultram (Tramadol) Nausea Only       LMP:  Patient's last menstrual period was 08/15/2011.    History     Social History   ??? Marital Status: MARRIED     Spouse Name: N/A     Number of Children: N/A   ??? Years of Education: N/A     Occupational History   ??? Not on file.     Social History Main Topics   ??? Smoking status:  Former Smoker -- 10 years     Quit date: 07/11/2010   ??? Smokeless tobacco: Never Used   ??? Alcohol Use: No   ??? Drug Use: No   ??? Sexually Active: Yes -- Female partner(s)     Birth Control/ Protection: Surgical      Comment: BTL 19 years ago     Other Topics Concern   ??? Not on file     Social History Narrative   ??? No narrative on file         Family History   Problem Relation Age of Onset   ??? Seizures Mother    ??? Seizures Father    ??? Diabetes Mother    ??? Asthma Mother    ??? High Cholesterol Mother    ??? Heart Attack Mother    ??? Hypertension Father    ??? Cancer Father      throat cancer   ??? Heart Attack Father    ??? Hypertension Sister    ??? Hypertension Paternal Grandmother    ??? Diabetes Paternal Grandmother    ??? Breast Cancer Paternal Grandmother    ??? Cancer Paternal Grandfather      leukemia         Meaningful use:  done      ROS:  The patient denies:  Headches/SOB/fevers  Any positive ROS include: ches wall pain    Patient's last menstrual period was 08/15/2011.    Physical Exam  BP 141/83   Pulse 73   Temp(Src) 97.7 ??F (36.5 ??C) (Oral)   Resp 18   Ht 5\' 5"  (1.651 m)   Wt 256 lb (116.121 kg)   BMI 42.6 kg/m2   SpO2 97%   LMP 08/15/2011  BP  Readings from Last 3 Encounters:   07/01/12 141/83   06/26/12 138/102   06/23/12 131/62       Constitutional:  Appears well,  No Acute Distress, Vitals noted  Psychiatric:   Affect normal, Alert and Oriented to person/place/time    Eyes:   Pupils equally round and reactive, EOMI, conjunctiva clear, eyelids normal  ENT:   External ears and nose normal/lips, teeth=OK/gums normal, TMs and Orophyarynx normal  Neck:   general inspection and Thyroid normal.  No abnormal cervical or supraclavicular nodes    Lungs:   clear to auscultation, good respiratory effort  Heart:   Ausculation normal.  Regular rhythm.  No cardiac murmurs.  No carotid bruits or palpable thrills  Chest wall:  I can reproduce her pain with sternal palpation    Abdominal exam:   normal.  Liver and spleen normal.  No bruits/masses/tenderness    Extremities:   without edema, good peripheral pulses  Skin:   Warm to palpation, without rashes, bruising, or suspicious lesions     Joint Exam    Ankles   full range of motion at both ankle joints   no joint effusion or pain   no evidence of joint instability    Knees   full range of motion at both knee joints   no joint effusion or pain   no evidence of joint instability    Hips   full range of motion (ROM) at both hip joints   no pain on ROM   no evidence of joint instability    Hands   no evidence of osteoarthritic changes   good grip strength   no deformities of hands noted    Elbows   full range of motion at both  elbow joints   no joint effusion or pain   no evidence of joint instability   no tenderness over medial lateral epicondyle    Shoulders   full range of motion (ROM) at both shoulder joints   no pain with ROM of  either shoulder   no evidence of joint instability   no impingement signs    Spine   no tenderness over L/S spine,  T-spine,  C-spine   full range of motion of spine with out pain    Bones   palpation over major bones show no pain or obvious deformaties        Assessment:    Patient Active  Problem List   Diagnosis Code   ??? Depression 311   ??? Hyperlipidemia 272.4   ??? Diabetes mellitus 250.00   ??? GERD (gastroesophageal reflux disease) 530.81   ??? 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 Type II or unspecified type diabetes mellitus without mention of complication, uncontrolled 250.02 AMB POC HEMOGLOBIN A1C     AMB POC GLUCOSE BLOOD, BY GLUCOSE MONITORING DEVICE   2. Chest pain 786.50 REFERRAL TO CARDIOLOGY     AMB POC EKG ROUTINE W/ 12 LEADS, INTER & REP   3. Depression 311 REFERRAL TO PSYCHIATRY   4. Anxiety 300.00 REFERRAL TO PSYCHIATRY   5. Costochondral chest pain 786.52 HYDROcodone-acetaminophen (NORCO) 5-325 mg per tablet       Plan:  Orders Placed This Encounter   ??? REFERRAL TO CARDIOLOGY     Referral Priority:  Routine     Referral Type:  Consultation     Referral Reason:  Specialty Services Required     Referred to Provider:  Laddie Aquas, MD   ??? REFERRAL TO PSYCHIATRY     Referral Priority:  Routine     Referral Type:  Behavioral Health     Referral Reason:  Specialty Services Required     Referred to Provider:  Lawanna Kobus, MD   ??? AMB POC HEMOGLOBIN A1C   ??? AMB POC GLUCOSE BLOOD, BY GLUCOSE MONITORING DEVICE   ??? AMB POC EKG ROUTINE W/ 12 LEADS, INTER & REP     Order Specific Question:  Reason for Exam:     Answer:  chest pain   ??? HYDROcodone-acetaminophen (NORCO) 5-325 mg per tablet     Sig: Take 1 Tab by mouth every eight (8) hours as needed for Pain.     Dispense:  12 Tab     Refill:  0       See patient instructions  Patient Instructions   Consider seeing psychiatrist for anxiety and depression    Please call Minnie to help arrange and authorize your tests and/or referrals.  Her # is [phone]     Consider counseling with Dr. Jaynie Bream [phone]    If needed, consider [facility] Mental [facility] Crisis Hotline at [phone] or [phone]  after hours    See cardiologist for evaluation    Resume metformin    Follow up in one week                  refresh note:  done    AVS Printed:  done      Patient states that her last xanax was one month ago

## 2012-07-01 NOTE — Patient Instructions (Addendum)
Consider seeing psychiatrist for anxiety and depression    Please call Garlan Fair to help arrange and authorize your tests and/or referrals.  Her # is [phone]     Consider counseling with Dr. Jaynie Bream [phone]    If needed, consider [facility] Crisis Hotline at [phone] or [phone] after hours    See cardiologist for evaluation    Resume metformin    Follow up in one week    Obtain previous records    If you take narcotic pain medications, remember that these medicines can be intoxicating and should be used with caution.  Refrain from driving or putting your self or others at risk while on this medicine.    Be careful when rising to a standing position or losing your balance while on these medicines.    Do not mix these medicines with alcohol.    Remember that all narcotics can be constipating.  You may wish to take a stool softener such as Senekot-S while on these medicines.      Narcotics should only be refilled in person so you may need a follow up office visit for any refills.

## 2012-07-10 NOTE — Progress Notes (Signed)
[facility]  [address], [address]  [place], [place] [address]  [phone] FAX [phone]  Kathe Mariner, MD   Annia Friendly, MD  Lavella Lemons, [place]  Office Visit    Patient ID:  Name: Jody [name]  MRN: [id number]  DOB: [birth year]/41 y.o.  Visit date: 07/10/2012    Primary Diagnosis:     ICD-9-CM    1. 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 Depression 311   ??? Hyperlipidemia 272.4   ??? Diabetes mellitus 250.00   ??? GERD (gastroesophageal reflux disease) 530.81   ??? 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  She underwent a TLH, BSO in December 2013.  She was not recommended any additional therapy.  She presents today for her routine surveillance.  She is doing well and is without complaints.   She denies any vaginal bleeding or discharge, any pelvic or abdominal pain, or any changes in her bowel or bladder habits.      Negative GU and GI review.  Negative cardiopulmonary review. No SOB, palpitations. No syncope  Negative musculoskeletal review.   Negative Psychological/Neurological review.    Patient denies any abnormal bleeding or vaginal discharge. Weight stable.      PMH:  Past Medical History   Diagnosis Date   ??? Diabetes    ??? Asthma    ??? Endometrial thickening on ultra sound    ??? Diabetic neuropathy      Per pt diagnosed by a physician a [facility]   ??? Lumbar disc disease    ??? Other ill-defined conditions      high cholesterol   ??? Psychiatric disorder      depression, anxiety   ??? 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 GERD (gastroesophageal reflux disease)    ??? Thyroid disease      PSH:  Past Surgical History   Procedure  Laterality Date   ??? Hx orthopaedic       back   ??? Hx appendectomy     ??? Pr hysteroscopy,w/endo bx N/A 02/06/2012     OPERATIVE HYSTEROSCOPY, RESECTION SUBMUCOSSAL FIBROIDS performed by Eyvonne Mechanic, MD at [facility] MAIN OR   ??? Hx tubal ligation  1994   ??? Hx gyn  03/05/12     TLH/BSO/Lymphadenectomy     SOC:  History     Social History   ??? Marital Status: MARRIED     Spouse Name: N/A     Number of Children: N/A   ??? Years of Education: N/A     Occupational History   ??? Not on file.     Social History Main Topics   ??? Smoking status: Current Every Day Smoker -- 0.50 packs/day for 10 years     Types: Cigarettes     Last Attempt to Quit: 07/11/2010   ??? Smokeless tobacco: Never Used   ??? Alcohol Use: No   ??? Drug Use: No   ??? Sexually Active: Yes -- Female partner(s)     Birth Control/ Protection: Surgical  Comment: BTL 19 years ago     Other Topics Concern   ??? Not on file     Social History Narrative   ??? No narrative on file     Family History  Family History   Problem Relation Age of Onset   ??? Seizures Mother    ??? Seizures Father    ??? Diabetes Mother    ??? Asthma Mother    ??? High Cholesterol Mother    ??? Heart Attack Mother    ??? Hypertension Father    ??? Cancer Father      throat cancer   ??? Heart Attack Father    ??? Hypertension Sister    ??? Hypertension Paternal Grandmother    ??? Diabetes Paternal Grandmother    ??? Breast Cancer Paternal Grandmother    ??? Cancer Paternal Grandfather      leukemia     Medications: (reviewed)  Current Outpatient Prescriptions on File Prior to Visit   Medication Sig Dispense Refill   ??? HYDROcodone-acetaminophen (NORCO) 5-325 mg per tablet Take 1 Tab by mouth every eight (8) hours as needed for Pain.  12 Tab  0   ??? sertraline (ZOLOFT) 100 mg tablet TAKE ONE TABLET BY MOUTH EVERY DAY  30 Tab  0   ??? glipiZIDE (GLUCOTROL) 5 mg tablet Take 1 Tab by mouth two (2) times a day.  60 Tab  1   ??? metFORMIN (GLUCOPHAGE) 1,000 mg tablet Take 1 Tab by mouth two (2) times daily (with meals).  60 Tab  1   ??? ferrous  sulfate (IRON) 325 mg (65 mg iron) tablet Take  by mouth Daily (before breakfast).       ??? omeprazole (PRILOSEC) 20 mg capsule Take 20 mg by mouth daily.       ??? esomeprazole (NEXIUM) 20 mg capsule Take 1 Cap by mouth daily.  30 Cap  6   ??? ibuprofen (MOTRIN) 800 mg tablet Take 1 Tab by mouth every eight (8) hours as needed for Pain.  90 Tab  0     No current facility-administered medications on file prior to visit.     Allergies: (reviewed)  Allergies   Allergen Reactions   ??? Erythromycin Nausea and Vomiting and Swelling   ??? Levaquin (Levofloxacin) Rash and Swelling   ??? Macrolide Antibiotics Rash, Nausea and Vomiting and Swelling   ??? Nsaids (Non-Steroidal Anti-Inflammatory Drug) Other (comments)     Per pt, was told by GI d/t stomach ulcers   ??? Pcn (Penicillins) Nausea and Vomiting and Swelling   ??? Ultram (Tramadol) Nausea Only       ROS:  Negative     OBJECTIVE:    PHYSICAL EXAM  VITAL SIGNS: Filed Vitals:    07/10/12 0836   BP: 136/64   Pulse: 87   Height: 5\' 5"  (1.651 m)   Weight: 252 lb (114.306 kg)      GENERAL APP: Conversant, alert, oriented. NAD   HEENT: Normocephalic. Oropharynx clear.   Neck: Trachea midline, no JVD, no thyroid enlargement   RESPIRATORY: Lung fields clear to auscultation and percussion. Adequate respiratory effort   CARDIOVASC: RRR without murmur   GASTROINT: soft, non-tender, without masses or organomegaly. No hernia noted   MUSCULOSKEL: FROM. No focal tenderness.   EXTREMITIES: extremities normal, atraumatic, no cyanosis or edema. Pulses appreciated.   PELVIC: External genitalia: normal general appearance  Urinary system: urethral meatus normal  Vaginal: normal without tenderness, induration or masses  Cervix: absent  Adnexa:  removed surgically  Uterus: absent   RECTAL: Deferred   NODAL SURVEY: Negative throughout---neck, axillae, inguina.   NEURO: Grossly intact, no acute deficit. Oriented. Not depressed. Not agitated.       IMPRESSION AND PLAN:  Jeanine A Dorning has a working diagnosis of  stage IA, grade 1 endometrial carcinoma.  She has no evidence of disease on today's exam.  We will follow up on today's pap and I will see her back in 3 months for continued surveillance.      The patient has been advised to annual mammograph and screening colonoscopy.  The patient is advised to call with any problems, our availability is expressed.    Domenica Fail, MD  07/10/2012/8:50 AM

## 2012-07-10 NOTE — Progress Notes (Signed)
Pt no longer taking Nexium, Ibuprofen, or Prilosec.
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
## 2012-07-11 NOTE — Progress Notes (Signed)
Called pt to let her know we might be able to help with her nexium but we needed financials and she needed to sign papers.  A family member answered the phone and I asked if she would have Ms Bisono call me on Monday.

## 2012-07-12 LAB — CBC WITH AUTOMATED DIFF
ABS. BASOPHILS: 0 10*3/uL (ref 0.0–0.2)
ABS. EOSINOPHILS: 0.3 10*3/uL (ref 0.0–0.4)
ABS. IMM. GRANS.: 0 10*3/uL (ref 0.0–0.1)
ABS. MONOCYTES: 0.4 10*3/uL (ref 0.1–0.9)
ABS. NEUTROPHILS: 5.3 10*3/uL (ref 1.4–7.0)
Abs Lymphocytes: 2.1 10*3/uL (ref 0.7–3.1)
BASOPHILS: 0 % (ref 0–3)
EOSINOPHILS: 4 % (ref 0–5)
HCT: 49.3 % — ABNORMAL HIGH (ref 34.0–46.6)
HGB: 16.6 g/dL — ABNORMAL HIGH (ref 11.1–15.9)
IMMATURE GRANULOCYTES: 0 % (ref 0–2)
Lymphocytes: 25 % (ref 14–46)
MCH: 30.3 pg (ref 26.6–33.0)
MCHC: 33.7 g/dL (ref 31.5–35.7)
MCV: 90 fL (ref 79–97)
MONOCYTES: 5 % (ref 4–12)
NEUTROPHILS: 66 % (ref 40–74)
PLATELET: 241 10*3/uL (ref 155–379)
RBC: 5.48 x10E6/uL — ABNORMAL HIGH (ref 3.77–5.28)
RDW: 13.8 % (ref 12.3–15.4)
WBC: 8.1 10*3/uL (ref 3.4–10.8)

## 2012-07-12 LAB — T4, FREE: T4, Free: 1.49 ng/dL (ref 0.82–1.77)

## 2012-07-12 LAB — TSH 3RD GENERATION: TSH: 0.145 u[IU]/mL — ABNORMAL LOW (ref 0.450–4.500)

## 2012-07-15 NOTE — Progress Notes (Signed)
Quick Note:    Has elevated Hgb likely due to smoking  Her TSH is low, however her thyroid hormone level is normal  Would recommend no therapy at this time  ______

## 2012-07-16 NOTE — Progress Notes (Signed)
Quick Note:    Normal pap smear.  ______

## 2012-07-28 NOTE — Progress Notes (Signed)
Received blank/ unsigned application from patient for [place] [employer] [place] for patient's nexium.   Reviewed chart noted Spoke with patient after 04/22/12 office visit about patient's medication explained I would need financial info. Unsigned application still on file in Nurse navigator's office.   Contacted patient, Explained I would need copy of financial info and patient to sign application to submit to program. Patient states understanding. She said she is currently unemployed, he son receives a disability check and he supports her. Explained I would need a copy of his income and a letter stating he supports him.  Patient states she will get information and stop by office to see writer to complete application.

## 2012-08-13 NOTE — Progress Notes (Signed)
HPI:  Jody [name] is a 42 y.o. female who presents with left  knee pain.  She had a fall 3 months ago and was seen in the ER and radiographs were negative for fracture and dislocation.  Pain has been intermittent since the fall.  Pain worsened 3 days ago after standing up from a seated position.  No mechanical sx.  Pain in the anterior knee.  Pain with weight bearing.     Past Medical History   Diagnosis Date   ??? Diabetes    ??? Asthma    ??? Endometrial thickening on ultra sound    ??? Diabetic neuropathy      Per pt diagnosed by a physician a [facility]   ??? Lumbar disc disease    ??? Other ill-defined conditions      high cholesterol   ??? Psychiatric disorder      depression, anxiety   ??? 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 GERD (gastroesophageal reflux disease)    ??? Thyroid disease      Current outpatient prescriptions:sertraline (ZOLOFT) 100 mg tablet, Take 1.5 Tabs by mouth daily., Disp: 60 Tab, Rfl: 0;  ALPRAZolam (XANAX) 0.25 mg tablet, Take 1 Tab by mouth three (3) times daily as needed for Anxiety., Disp: 60 Tab, Rfl: 0;  omeprazole (PRILOSEC) 20 mg capsule, Take 20 mg by mouth daily., Disp: , Rfl: ;  glipiZIDE (GLUCOTROL) 5 mg tablet, Take 1 Tab by mouth two (2) times a day., Disp: 60 Tab, Rfl: 1  metFORMIN (GLUCOPHAGE) 1,000 mg tablet, Take 1 Tab by mouth two (2) times daily (with meals)., Disp: 60 Tab, Rfl: 1;  HYDROcodone-acetaminophen (NORCO) 5-325 mg per tablet, Take 1 Tab by mouth every eight (8) hours as needed for Pain., Disp: 12 Tab, Rfl: 0;  esomeprazole (NEXIUM) 20 mg capsule, Take 1 Cap by mouth daily., Disp: 30 Cap, Rfl: 6;  ferrous sulfate (IRON) 325 mg (65 mg iron) tablet, Take  by mouth Daily (before breakfast)., Disp: , Rfl:   Allergies   Allergen Reactions   ??? Erythromycin Nausea and Vomiting and Swelling   ??? Levaquin (Levofloxacin) Rash and Swelling   ??? Macrolide Antibiotics Rash, Nausea and Vomiting and Swelling   ??? Nsaids (Non-Steroidal Anti-Inflammatory Drug) Other (comments)     Per  pt, was told by GI d/t stomach ulcers   ??? Pcn (Penicillins) Nausea and Vomiting and Swelling   ??? Ultram (Tramadol) Nausea Only     Past Medical History   Diagnosis Date   ??? Diabetes    ??? Asthma    ??? Endometrial thickening on ultra sound    ??? Diabetic neuropathy      Per pt diagnosed by a physician a [facility]   ??? Lumbar disc disease    ??? Other ill-defined conditions      high cholesterol   ??? Psychiatric disorder      depression, anxiety   ??? 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 GERD (gastroesophageal reflux disease)    ??? Thyroid disease      Family History   Problem Relation Age of Onset   ??? Seizures Mother    ??? Seizures Father    ??? Diabetes Mother    ??? Asthma Mother    ??? High Cholesterol Mother    ??? Heart Attack Mother    ??? Hypertension Father    ??? Cancer Father      throat cancer   ???  Heart Attack Father    ??? Hypertension Sister    ??? Hypertension Paternal Grandmother    ??? Diabetes Paternal Grandmother    ??? Breast Cancer Paternal Grandmother    ??? Cancer Paternal Grandfather      leukemia       ROS: As per HPI otherwise negative.    Objective:   BP 141/66   Pulse 87   Temp(Src) 98.2 ??F (36.8 ??C) (Oral)   Resp 16   Ht 5\' 5"  (1.651 m)   Wt 252 lb (114.306 kg)   BMI 41.93 kg/m2   LMP 08/15/2011  Gen: Well appearing.  No apparent distress.  Alert and oriented.  Responds to all questions appropriately.  Lungs: No labored respirations.  Talking in complete sentences without difficulty.  Musculoskeletal:  Knee: left  Knee Effusion: None  Quadriceps atrophy: None   Popliteal (Baker???s) Cyst: Negative   Patellofemoral crepitus: Negative  Q angle:     ROM:  Flexion: 100  Extension: 0   Hip IR/ER: FROM without pain    Dynamic Test:  Gait: slightly antalgic  Assistive devices: None    Palpation:   Patella tenderness: None  Patellar tendon tenderness: None  Quad tendon tenderness: None  Medial joint line tenderness: None  Lateral joint line tenderness: tender  MCL tenderness: None  LCL tenderness: None  Medial facet  tenderness: tender  Lateral facet tenderness: tender  Condyle tenderness: None  Tibia tubercle tenderness: None  Proximal fibula tenderness: None  Plica tenderness: None  Prepatellar bursa tenderness: None  Pes bursa tenderness: None  ITB tenderness: None    Ligament/Meniscal Exam:  Patellar Grind: Positive  Patellar apprehension (medial/lateral): Negative   Lochman: Negative, with good endpoint   Valgus stress: Negative with good endpoint   Varus stress: Negative with good endpoint     Strength (0-5/5):   Flexion: Left: 5/5    Right: 5/5    Extension: Left: 5/5    Right: 5/5    Hip abduction: 5/5    Hip adduction: 5/5      Neuro/Vascular : Pulses intact, no edema, and neurologically intact .  Skin: No obvious rash or skin breakdown.    Imaging: radiographs from 05/19/12 reviewed that demonstrates no obvious fracture or dislocation.    [facility]  OFFICE PROCEDURE PROGRESS NOTE        Chart reviewed for the following:   I, Resa Miner, MD, have reviewed the History, Physical and updated the Allergic reactions for Jody [name]     TIME OUT performed immediately prior to start of procedure:   I, Resa Miner, MD, have performed the following reviews on Jody [name] prior to the start of the procedure:            * Patient was identified by name and date of birth   * Agreement on procedure being performed was verified  * Risks and Benefits explained to the patient  * Procedure site verified and marked as necessary  * Patient was positioned for comfort  * Consent was signed and verified     PROCEDURE NOTE:     Informed consent obtained verbally and risks, benefits and alternatives discussed.  Time out performed, cross checking patient ID and procedure.  The left knee was cleaned and prepped with sterile technique using Chloraprep x3 and anesthetized with ethyl chloride spray and 3ml of 1% lidocaine.  The patella, quad tendon, femur and the superior joint space were identified  with ultrasound  and the knee was aspirated with 18g needle and 30ml of clear synovial fluid removed.  The knee was then injected from a superior-lateral approach with Celestone 12mg  and 3ml of 1% lidocaine under sterile conditions using ultrasound guidance.  The patient tolerated the procedure well and there were no complications.          ASSESSMENT:    ICD-9-CM    1. Knee pain 719.46 AMB POC [place], SONO GUIDE NEEDLE     PR DRAIN/INJECT LARGE JOINT/BURSA   2. 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 Referred to Physical Therapy.   Ice 15 minutes, three times a day PRN   Corticosteroid injection as above    Medications:    1. Acetaminophen (Tylenol): [ 500mg  1-2 tablets every 6 hours as needed for pain.       F/U: 4-6 weeks

## 2012-08-15 ENCOUNTER — Encounter

## 2012-08-15 NOTE — Progress Notes (Signed)
Two attempts to contact, patient has not brought in final documentation. Cannot proceed with [place] until financial information received.

## 2012-08-15 NOTE — Telephone Encounter (Signed)
Patient also had a 2nd message request:    Dr. Redge Gainer [phone])  Konya Fauble Ziller      MRN: [id number] DOB: September 25, 1970      Pt Home: [phone]      Sent: Thu Aug 14, 2012 5:17 PM      To: [facility] Front Office                   Message      Patient is having side effects from taking Tylenol for knee pain can you suggest another medication. Her number is [phone].

## 2012-08-15 NOTE — Telephone Encounter (Signed)
Message copied by Hyman Hopes on Fri Aug 15, 2012  8:07 AM  ------       Message from: Renae Gloss, PIER D       Created: Thu Aug 14, 2012  5:19 PM       Regarding: Dr. Dola Argyle         Patient needs a refill of Metformin, Glipizide,Zoloft and Xanax sent to the [facility] pharmacy at [phone].  ------

## 2012-08-19 NOTE — Telephone Encounter (Signed)
Message copied by Annye English on Tue Aug 19, 2012  8:09 AM  ------       Message from: Renae Gloss, PIER D       Created: Mon Aug 18, 2012  5:00 PM       Regarding: Dr. Harless Litten         Please call patient back as soon as possible at [phone]. The patient is having knee pain and can't take tylenol it makes her stomach ache.  ------

## 2012-08-19 NOTE — Telephone Encounter (Signed)
Left a voicemail for pt. Letting her know that per Dr. Su Hilt, she could use Motrin or Aleve, ice her knee and start or continue physical therapy. Asked pt. To call me back with any further questions.

## 2012-08-20 LAB — AMB POC URINALYSIS DIP STICK AUTO W/O MICRO
Bilirubin (UA POC): NEGATIVE
Blood (UA POC): NEGATIVE
Ketones (UA POC): NEGATIVE
Leukocyte esterase (UA POC): NEGATIVE
Nitrites (UA POC): NEGATIVE
Protein (UA POC): NEGATIVE mg/dL
Specific gravity (UA POC): 1.03 (ref 1.001–1.035)
Urobilinogen (UA POC): 0.2 (ref 0.2–1)
pH (UA POC): 6 (ref 4.6–8.0)

## 2012-08-20 LAB — AMB POC URINE, MICROALBUMIN, SEMIQUANT (3 RESULTS)
ALBUMIN, URINE POC: 10 mg/L
CREATININE, URINE POC: 200 mg/dL
Microalbumin/creat ratio (POC): <30 mg/g

## 2012-08-20 NOTE — Progress Notes (Signed)
[facility]  [address] [place], [address]  [place], [place] [address]  Phone: [phone]      Fax:  [phone]      Plan of Care/ Statement of Necessity for Physical Therapy Services    Jody [name]  08/20/2012   Leron Croak, MD   Provider #                      Diagnosis: KNEE PAIN  Onset Date: February 2014  Prior Hospitalization: 2 ER visits due to knee painfollowing fall in February 2014      Start of Care:08/20/2012  Prior Level of Function:No hx of knee pain, ADLs limited by low back pain  Comorbidities: Depression, DM, Asthma, CA, Scoliosis    The Plan of Care and following information is based on the information from the initial evaluation.  Assessment/ key information: The patient presents anterior knee pain secondary to fall when pushing a car in the snow in February of 2014.  The patient demonstrated decreased LLE strength and limited knee AROM affecting her ability to ambulate in the community and complete transfers.  Objective data was difficult to obtain due high levels of pain this visit.  Problem List:   pain affecting function, decrease ROM, decrease strength, edema affecting function, impaired gait/ balance, decrease ADL/ functional abilitiies, decrease activity tolerance, decrease flexibility/ joint mobility and decrease transfer abilities   Treatment Plan may include any combination of the following:  Therapeutic exercise, Therapeutic activities, Neuromuscular re-education, Physical agent/modality, Gait/balance training, Manual therapy and Patient education  Patient / Family readiness to learn indicated by: asking questions, trying to perform skills and interest  Persons(s) to be included in education:   patient (P)  Barriers to Learning/Limitations: no    Patient Goal (s): To decrease the knee pain  Rehabilitation Potential: good    Short Term Goals: To be accomplished in 4  weeks  1) The patient will be independent with introductory HEP   2) The patient will report pain no greater than 8/10 to improve standing tolerance   3) The patient will improve knee flexion AROM to 95 degrees  Long Term Goals: To be accomplished in 12  weeks   1) The patient will demonstrate pain free knee flexion AROM to improve ease with dressing  2) The patient will demonstrate 4+/5 or greater strength in LLE to improve ease with household chores  3) The patient will improve LEFS by 9 points to indicate a significant improvement in function   4) The patient will ambulate with even weight shift for 10 minutes without an increase in pain to improve her mobility in the community  Frequency / Duration: Patient to be seen 2 times per week for 12 weeks:  Patient/ Caregiver education and instruction: activity modification and exercises  .  Certification Period: -    Duke Salvia, PT 08/20/2012 2:39 PM    ________________________________________________________________________    I certify that the above Therapy Services are being furnished while the patient is under my care. I agree with the treatment plan and certify that this therapy is necessary.  Y or N I have read the above and request that my patient continue as recommended.  Y or N I have read the above report and request that my patient continue therapy with the following changes/special instructions:  Y or N I have read the above report and request that my patient be discharged from therapy  Physician's Signature:____________________  Date:________________    Please sign and return to [facility]: Fax: [phone].  THANK YOU!

## 2012-08-20 NOTE — Telephone Encounter (Signed)
Pt. Was in the office today seeing Dr. Barth Kirks for a medication refill. Pt. States that she was not contacted to set up physical therapy appointment so I gave her the contact # to the [facility] for her to set up an appointment today. Pt. States that she cannot take aleve/Motrin because it upsets her stomach, she states that she took 1500 mg of Tylenol yesterday and said that it upset her stomach and that the swelling and pain in her knee have worsened since she had the injection. Pt. Wanted to see what you recommend. She states that icing is not helping either. Pt. Informed that you are not in the office today and that I would send you a message to see what you recommend. She states that she will schedule the physical therapy today.

## 2012-08-20 NOTE — Progress Notes (Signed)
Fell in February and hurt her knee -- had knee effusion   Had steroid injection done  Has tried heating pad, ice, and elevation   Was taking tylenol for the pain, states it was tearing her stomach up    How is anxiety -- states that she got chest pain, states that it made her chest pain  Taking zoloft -- 1-1/2 tabs per day  States that she does not feel like it is working  Has ran out of xanax  Has ran out of zoloft today    Needs eye exam -- retinal exam     Willing to try counseling -- however pt does not have insurance    Thinks that she may have a UTI -- states that she has dysuria for three days, also states that she does not empty completely    Needs:  Refill zoloft -- 200 mg a day  Will start clonazepam for this time  Will increase glucotrol to two tabs in morning and one tab at night  Referral to physical therapy for left knee pain  Needs urine for urine microalbumin   F/U in one month

## 2012-08-20 NOTE — Progress Notes (Signed)
PT INITIAL EVALUATION    Patient Name: Jody [name]  Date:08/20/2012  DOB: 22-Jan-1971  [x]   Patient DOB Verified  Payor: CHARITY  Plan: [facility] CHARITY 100%  Product Type: Charity     In time:1:30 PM  Out time:2:30 PM  Total Treatment Time (min): 60  Total Timed Codes (min): 25  1:1 Treatment Time (MC only): -   Visit #: 1     Treatment Area: L knee    SUBJECTIVE  Pain Level (0-10 scale): 9/10  At worst: 10/10, pain is increased by bending the knee, getting in and out of the car, getting up from sitting down, too much walking, "If I try to bend my knee," Getting in and out of the bathtub,   At best: 7-8/10, pain is decreased by laying flat on her back with the leg propped up  History of present illness:  Pt reports she was pushing a car up the driveway in the snow in February and she fell face down. "It happened so fast I didn't even know I hurt my knee."  She went to the ER two times in the first month after the injury, "they told me nothing was broken [...]They put me on hydrocodone."  It continued to hurt 8 weeks later so she went to an MD.  She doesn't know what she did but it started swelling a lot.  Pt saw Dr Su Hilt last week who withdrew 30 ccs of fluid from her L knee.  Pt has had no relief in pain.  Pt was taking Tylenol "but that tore my stomach up." Pt reports "2 discs exploded in my back [...] and 10 nerves are damaged."  Pt has used ice/ heat/ and elevation with no pain relief  Knee pain anteriorly, burning pain down anterior lower leg, pain in the top of the foot, No posterior knee pain  Pt is not taking pain medication  X-rays negative, No MRI  Prior level of function: No hx of knee  Goal for PT: To decrease pain in the knee    OBJECTIVE  Posture:  Genu Valgus  Other Observations:  Elevated BMI  Gait and Functional Mobility: Pt ambulates with decreased stance time on LLE, decreased L knee flexion and decreased cadence   Pain during transfers sit to stand and sit to supine, pt requires BUE to complete task  due to pain  Palpation: tender to palpation at distal quad, distal ITB  Swelling:inferior lateral knee swelling  Joint Mobility: difficult to assess due to high levels of pain    LE AROM:        R  L  Knee Flexion  -3  -5 p!       Knee Extension 140  90 p!        LOWER QUARTER   MUSCLE STRENGTH  KEY       R  L  0 - No Contraction  L1, L2 Psoas  5  3+  1 - Trace   L3 Quads  5  3+ p! "burning pain across knee"  2 - Poor   L4 Tib Ant  5  4+  3 - Fair    L5 EHL  -  -  4 - Good   S1 FHL  5  4 p!  5 - Normal   S2 Hams  5  4- p!           MMT: See above  Neurological: Sensation: Decreased sensation throughout LLE    Outcome  Measure: LEFS (80= full function) 14/80, see chart for details    Modality (rationale): To decrease pain and inflammation  [x]   E-Stim: type premod_ x 10_ min     [] att   [x] unatt   [] w/US   [x] w/ice   [] w/heat  []   Traction: [] cerv   [] pelvic   _ lbs x _ min     [] pro   [] sup   [] int   [] const  []   Ultrasound: [] cont   [] pulse    _ W/cm2 x _  min   []   []  []   Iontophoresis: [] take home patch w/ dexamethazone    [] 40mA   [] 80mA                               [] _ mA min w/: [] dexamethazone   [] other:_  []   Ice pack _  min     []  Hot pack _  min     []  Paraffin _  min  []   Vaso Pneumatic Device 15 mins at mod compression at 34 deg      Therapeutic Exercise: [x]  see flow sheet     []  Other:_  Added/Changed Exercises:  [x]   Added: per flow sheet:  to improve (function): quad strength, knee AROM  []   Changed:_ to improve (function):  (minutes: 15)    Manual Therapy: MFR to distal quad/ ITB, Grade I patellar mobs multidirectionally  (minutes: 10)    Patient Education: [x]  Review HEP    []  Progressed/Changed HEP based on:_   []  positioning   [x]  body mechanics   []  transfers   [x]  heat/ice application    Pain Level (0-10 scale) post treatment: 6.5-7/10    ASSESSMENT  [x]   See Plan of Care  []   See progress note/recertification  [x]   Patient will continue to benefit from skilled therapy to address  remaining functional deficits: Sitting tolerance, standing tolerance, walking tolerance, ADLs    PLAN  [x]   Upgrade activities as tolerated     [x]   Continue plan of care  []   Discharge due to:_  []  Other:_      Duke Salvia, PT 08/20/2012  1:33 PM

## 2012-08-21 LAB — URINALYSIS W/ REFLEX CULTURE
Glucose: NEGATIVE mg/dL
Ketone: NEGATIVE mg/dL
Nitrites: NEGATIVE
Protein: 30 mg/dL — AB
Specific gravity: 1.028 (ref 1.003–1.030)
Urobilinogen: 1 EU/dL (ref 0.2–1.0)
WBC: >100 /hpf — ABNORMAL HIGH (ref 0–4)
pH (UA): 6 (ref 5.0–8.0)

## 2012-08-21 LAB — METABOLIC PANEL, COMPREHENSIVE
A-G Ratio: 0.9 — ABNORMAL LOW (ref 1.1–2.2)
ALT (SGPT): 43 U/L (ref 12–78)
AST (SGOT): 26 U/L (ref 15–37)
Albumin: 3.4 g/dL — ABNORMAL LOW (ref 3.5–5.0)
Alk. phosphatase: 126 U/L (ref 50–136)
Anion gap: 9 mmol/L (ref 5–15)
BUN/Creatinine ratio: 15 (ref 12–20)
BUN: 12 MG/DL (ref 6–20)
Bilirubin, total: 0.2 MG/DL (ref 0.2–1.0)
CO2: 28 mmol/L (ref 21–32)
Calcium: 8.5 MG/DL (ref 8.5–10.1)
Chloride: 100 mmol/L (ref 97–108)
Creatinine: 0.78 MG/DL (ref 0.45–1.15)
GFR est AA: >60 mL/min/{1.73_m2} (ref >60)
GFR est non-AA: >60 mL/min/{1.73_m2} (ref >60)
Globulin: 3.6 g/dL (ref 2.0–4.0)
Glucose: 262 mg/dL — ABNORMAL HIGH (ref 65–100)
Potassium: 4.1 mmol/L (ref 3.5–5.1)
Protein, total: 7 g/dL (ref 6.4–8.2)
Sodium: 137 mmol/L (ref 136–145)

## 2012-08-21 LAB — CBC WITH AUTOMATED DIFF
ABS. BASOPHILS: 0 10*3/uL (ref 0.0–0.1)
ABS. EOSINOPHILS: 0.4 10*3/uL (ref 0.0–0.4)
ABS. LYMPHOCYTES: 2.7 10*3/uL (ref 0.8–3.5)
ABS. MONOCYTES: 0.4 10*3/uL (ref 0.0–1.0)
ABS. NEUTROPHILS: 7.3 10*3/uL (ref 1.8–8.0)
BASOPHILS: 0 % (ref 0–1)
EOSINOPHILS: 3 % (ref 0–7)
HCT: 43.6 % (ref 35.0–47.0)
HGB: 15.8 g/dL (ref 11.5–16.0)
LYMPHOCYTES: 25 % (ref 12–49)
MCH: 31.5 PG (ref 26.0–34.0)
MCHC: 36.2 g/dL (ref 30.0–36.5)
MCV: 87 FL (ref 80.0–99.0)
MONOCYTES: 4 % — ABNORMAL LOW (ref 5–13)
NEUTROPHILS: 68 % (ref 32–75)
PLATELET: 237 10*3/uL (ref 150–400)
RBC: 5.01 M/uL (ref 3.80–5.20)
RDW: 13.1 % (ref 11.5–14.5)
WBC: 10.8 10*3/uL (ref 3.6–11.0)

## 2012-08-21 MED ADMIN — ondansetron (ZOFRAN) injection 4 mg: INTRAVENOUS | @ 23:00:00 | NDC 00409475503

## 2012-08-21 MED ADMIN — sodium chloride 0.9 % bolus infusion 1,000 mL: INTRAVENOUS | @ 23:00:00 | NDC 00409798309

## 2012-08-21 MED ADMIN — sodium chloride 0.9 % bolus infusion 1,000 mL: INTRAVENOUS | NDC 00409798309

## 2012-08-21 MED ADMIN — ketorolac (TORADOL) injection 30 mg: INTRAVENOUS | @ 23:00:00 | NDC 00409379501

## 2012-08-21 MED FILL — SODIUM CHLORIDE 0.9 % IV: INTRAVENOUS | Qty: 1000

## 2012-08-21 MED FILL — KETOROLAC TROMETHAMINE 30 MG/ML INJECTION: 30 mg/mL (1 mL) | INTRAMUSCULAR | Qty: 1

## 2012-08-21 MED FILL — ONDANSETRON (PF) 4 MG/2 ML INJECTION: 4 mg/2 mL | INTRAMUSCULAR | Qty: 2

## 2012-08-21 NOTE — ED Notes (Signed)
Pt reports she began with lower left abd pain few days with that has increase along with blood in urine and pressure with urination and frequency. Pt has some nausea. Pt was seen at PCP yesterday.

## 2012-08-21 NOTE — ED Notes (Addendum)
Bedside shift change report given to April R, RN (oncoming nurse) by Danelle Berry, RN (offgoing nurse).  Report given with SBAR, ED Summary and MAR.     Patient resting in bed with call bell in reach and fluids infusing as ordered.

## 2012-08-21 NOTE — ED Notes (Signed)
Patient (s)  given copy of dc instructions and 3 script(s).  Patient (s)  verbalized understanding of instructions and script (s).    Patient (s) verbalized understanding of the importance of discussing medications with  his or her physician or clinic they will be following up with.  Patient alert and oriented and in no acute distress.  Patient discharged home ambulatory with family member.

## 2012-08-21 NOTE — ED Provider Notes (Signed)
HPI Comments: Jody [name] is a 42yo female with pmhx significant for Diabetes, Asthma, Lumbar DDD, Depression, anxiety, Thyroid Dz, GERD, uterine Ca who presents to the ER for urinary hesitancy and pain and hematuria. Pt reports symptoms started few days ago and she went to her pcp yesterday. They did a u/a which was wnl. Pt reports she noted blood in her urine today and is having worsening hesistancy so she decided to come to the ER. She has some minimal left sided pelvic/abdominal pain. No flank tenderness. No fevers. No vomiting. She felt some nausea earlier. No change in BMS, no brbpr or melena. No cp or sob. No burning with urination, no vaginal discharge or bleeding per patient.    Patient is a 42 y.o. female presenting with frequency. The history is provided by the patient.   Urinary Frequency   Associated symptoms include frequency and hematuria. Pertinent negatives include no flank pain, no abdominal pain and no back pain.        Past Medical History   Diagnosis Date   ??? Diabetes    ??? Asthma    ??? Endometrial thickening on ultra sound    ??? Diabetic neuropathy      Per pt diagnosed by a physician a [facility]   ??? Lumbar disc disease    ??? Other ill-defined conditions      high cholesterol   ??? Psychiatric disorder      depression, anxiety   ??? 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 GERD (gastroesophageal reflux disease)    ??? Thyroid disease         Past Surgical History   Procedure Laterality Date   ??? Hx orthopaedic       back   ??? Hx appendectomy     ??? Pr hysteroscopy,w/endo bx N/A 02/06/2012     OPERATIVE HYSTEROSCOPY, RESECTION SUBMUCOSSAL FIBROIDS performed by Eyvonne Mechanic, MD at [facility] MAIN OR   ??? Hx tubal ligation  1994   ??? Hx gyn  03/05/12     TLH/BSO/Lymphadenectomy         Family History   Problem Relation Age of Onset   ??? Seizures Mother    ??? Seizures Father    ??? Diabetes Mother    ??? Asthma Mother    ??? High Cholesterol Mother    ??? Heart Attack Mother    ??? Hypertension Father    ??? Cancer Father       throat cancer   ??? Heart Attack Father    ??? Hypertension Sister    ??? Hypertension Paternal Grandmother    ??? Diabetes Paternal Grandmother    ??? Breast Cancer Paternal Grandmother    ??? Cancer Paternal Grandfather      leukemia        History     Social History   ??? Marital Status: MARRIED     Spouse Name: N/A     Number of Children: N/A   ??? Years of Education: N/A     Occupational History   ??? Not on file.     Social History Main Topics   ??? Smoking status: Current Every Day Smoker -- 0.50 packs/day for 10 years     Types: Cigarettes     Last Attempt to Quit: 07/11/2010   ??? Smokeless tobacco: Never Used   ??? Alcohol Use: No   ??? Drug Use: No   ??? Sexually Active: Yes -- Female partner(s)  Birth Control/ Protection: Surgical      Comment: BTL 19 years ago     Other Topics Concern   ??? Not on file     Social History Narrative   ??? No narrative on file                  ALLERGIES: Erythromycin; Levaquin; Macrolide antibiotics; Nsaids (non-steroidal anti-inflammatory drug); Pcn; and Ultram      Review of Systems   Constitutional: Negative.    HENT: Negative for neck pain and ear discharge.    Eyes: Negative for photophobia, pain, discharge and visual disturbance.   Respiratory: Negative for apnea, cough, chest tightness and shortness of breath.    Cardiovascular: Negative for chest pain, palpitations and leg swelling.   Gastrointestinal: Negative for abdominal pain, blood in stool and abdominal distention.   Genitourinary: Positive for dysuria, frequency, hematuria and difficulty urinating. Negative for flank pain.   Musculoskeletal: Negative for myalgias, back pain, joint swelling and gait problem.   Skin: Negative for color change and pallor.   Neurological: Negative for dizziness, syncope, weakness, numbness and headaches.   Psychiatric/Behavioral: Negative for behavioral problems and confusion. The patient is not nervous/anxious.        Filed Vitals:    08/21/12 1805 08/21/12 1842 08/21/12 2042   BP: 140/68 136/72 135/75    Pulse: 96 94 72   Temp: 97.6 ??F (36.4 ??C)  97.7 ??F (36.5 ??C)   Resp: 22 20 16    Height: 5' 5.5" (1.664 m)     Weight: 114.397 kg (252 lb 3.2 oz)     SpO2: 94% 96% 96%            Physical Exam   Nursing note and vitals reviewed.  Constitutional: She is oriented to person, place, and time. She appears well-nourished. No distress.   HENT:   Head: Normocephalic and atraumatic.   Eyes: Pupils are equal, round, and reactive to light.   Neck: Normal range of motion.   Cardiovascular: Normal rate and regular rhythm.  Exam reveals no friction rub.    No murmur heard.  Pulmonary/Chest: Effort normal and breath sounds normal.   Abdominal: Soft. Bowel sounds are normal. She exhibits no distension and no mass. There is tenderness in the left lower quadrant. There is no rebound and no guarding.       Musculoskeletal: Normal range of motion. She exhibits no edema and no tenderness.   Neurological: She is alert and oriented to person, place, and time.   Skin: Skin is warm and dry.   Psychiatric: She has a normal mood and affect.        MDM     Differential Diagnosis; Clinical Impression; Plan:     Seen and examined  U/A pending  Lesia Monica A Glenisha Gundry, PA    U/A shows 50-100 RBCs and >100 wbcs. Will ct to r/u kidney stones. PT has hx of uterine CA as well, r/u lesion/masses?  Brave Dack A Alyanna Stoermer, PA    No kidney stone, CT unremarkable   Will do abx. Pt is allergic to multiple abx  Will do rocephin here  She is calling her pharmacy to see if she can take Keflex  Arriyanna Mersch A Eisen Robenson, PA    Pt  Reports she can take Keflex. Will do a prescription of Keflex  Mazelle Huebert A Concetta Guion, PA        Amount and/or Complexity of Data Reviewed:   Clinical lab tests:  Ordered and reviewed  Tests in  the radiology section of CPT??:  Reviewed      Procedures    Patient has been reassessed.  Feeling  better.  Reviewed labs, medications and radiographics with patient.  Ready to discharge home.    Kennon Rounds, PA    Discussed case with attending Physician Harrie Jeans.  Agrees with care and will  D/C with follow up.    Megin Consalvo A Dasiah Hooley, PA    8:59 PM  Patient's results have been reviewed with them.  Patient and/or family have verbally conveyed their understanding and agreement of the patient's signs, symptoms, diagnosis, treatment and prognosis and additionally agree to follow up as recommended or return to the Emergency Room should their condition change prior to follow-up.  Discharge instructions have also been provided to the patient with some educational information regarding their diagnosis as well a list of reasons why they would want to return to the ER prior to their follow-up appointment should their condition change.  Abshir Paolini Brooke Dare, PA

## 2012-08-21 NOTE — Progress Notes (Signed)
Patient here to dropping off [place]. Application signed by physician and doctor. Faxed to [employer].

## 2012-08-21 NOTE — Progress Notes (Signed)
Quick Note:    Negative UA  Urine microalbumin less than 30    ______

## 2012-08-22 MED ADMIN — cefTRIAXone (ROCEPHIN) 1 g in 0.9% sodium chloride (MBP/ADV) 50 mL MBP: INTRAVENOUS | NDC 00781320885

## 2012-08-22 MED FILL — CEFTRIAXONE 1 GRAM SOLUTION FOR INJECTION: 1 gram | INTRAMUSCULAR | Qty: 1

## 2012-08-22 NOTE — Progress Notes (Signed)
PT DAILY TREATMENT NOTE    Patient Name: Jody [name]  Date:08/22/2012  DOB: 26-Aug-1970  [x]   Patient DOB Verified  Payor: CHARITY  Plan: [facility] CHARITY 100%  Product Type: Charity     In time:3:15 PM  Out time:4:15 PM  Total Treatment Time (min): 60  Total Timed Codes (min): 45  1:1 Treatment Time (MC only): -   Visit #: 2    Treatment Area: L knee    SUBJECTIVE  Pain Level (0-10 scale): 8/10  Any medication changes, allergies to medications, adverse drug reactions, diagnosis change, or new procedure performed?: [x]  No    []  Yes (see summary sheet for update)  Subjective functional status/changes:   []  No changes reported  Pt reported all home exercises hurt a lot.     OBJECTIVE  Modality (rationale): Decrease pain  [x]   E-Stim: type 2 channel premod x 15 min     [] att   [x] unatt   [] w/US   [x] w/ice   [] w/heat  []   Traction: [] cerv   [] pelvic   _ lbs x _ min     [] pro   [] sup   [] int   [] const  []   Ultrasound: [] cont   [] pulse    _ W/cm2 x _  min   []   []  []   Iontophoresis: [] take home patch w/ dexamethazone    [] 40mA   [] 80mA                               [] _ mA min w/: [] dexamethazone   [] other:_  []   Ice pack _  min     []  Hot pack _  min     []  Paraffin _  min  []   Vaso Pneumatic Device 15 mins at mod compression at 34 deg   []  Low-level Laser for 4 minutes    Therapeutic Exercise: [x]  see flow sheet     []  Other:_  Added/Changed Exercises:  [x]   Added:Per flow sheet  to improve (function): PROM  []   Changed:_ to improve (function):  (minutes:35)    Manual Therapy:  Patella Mobs gd I   (minutes:10)      Patient Education: []  Review HEP    []  Progressed/Changed HEP based on:_   []  positioning   []  body mechanics   []  transfers   []  heat/ice application    Other Objective/Functional Measures:  *pt stated pain in knee cap with burning up the leg with gd I patella mobs.      Pain Level (0-10 scale) post treatment: 8/10    ASSESSMENT  []   See Plan of Care  []   See progress note/recertification  [x]   Patient  will continue to benefit from skilled therapy to address remaining functional deficits: Sitting tolerance, standing tolerance, walking tolerance, ADLs      Progress towards goals / Updated goals:  Short Term Goals: To be accomplished in 4 weeks   1) The patient will be independent with introductory HEP   2) The patient will report pain no greater than 8/10 to improve standing tolerance   3) The patient will improve knee flexion AROM to 95 degrees   Long Term Goals: To be accomplished in 12 weeks   1) The patient will demonstrate pain free knee flexion AROM to improve ease with dressing   2) The patient will demonstrate 4+/5 or greater strength in LLE to improve ease with household chores  3) The patient will improve LEFS by 9 points to indicate a significant improvement in function   4) The patient will ambulate with even weight shift for 10 minutes without an increase in pain to improve her mobility in the community      PLAN  [x]   Upgrade activities as tolerated     [x]   Continue plan of care  []   Discharge due to:_  []  Other:_      Jessa Stinson [facility], PTA, CPT 08/22/2012  4:03 PM

## 2012-08-23 LAB — CULTURE, URINE
Colonies Counted: >100000
Colony Count: >100000

## 2012-08-23 NOTE — Progress Notes (Signed)
Quick Note:    Patient was discharged on Keflex . Sensitivities pending.    ______

## 2012-08-28 NOTE — ED Provider Notes (Signed)
I was personally available for consultation in the emergency department.  I have reviewed the chart and agree with the documentation recorded by the MLP, including the assessment, treatment plan, and disposition.  Akansha Wyche E Radie Berges, MD

## 2012-08-29 NOTE — Progress Notes (Signed)
Has seen physical therapist three times for left knee pain    Has knot on her left leg above the knee -- physical therapist advised her to have it checked out.    States that she took tylenol and that "it tore her stomach up"  Pt advised that tylenol should not cause stomach irritation    Pt has been seen multiple times for multiple issues    PE;  General -- awake, alert, cooperative  Left leg -- in distal aspect of thigh 2 cm thickness in the subcutaneous fluid, no fluctuance, no erythema, mild tenderness    Assessment:  Left knee pain  Thickness over left knee, unclear etiology    Plan:  Continue physical therapy  Tylenol for discomfort  F/U prn  reassurance
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
## 2012-09-01 NOTE — Progress Notes (Signed)
PT DAILY TREATMENT NOTE    Patient Name: Jody [name]  Date:09/01/2012  DOB: 05/07/1970  [x]   Patient DOB Verified  Payor: CHARITY  Plan: [facility] CHARITY 100%  Product Type: Charity     In time:8:30 AM  Out time:9:30 AM  Total Treatment Time (min): 60  Total Timed Codes (min): 45  1:1 Treatment Time (MC only): -   Visit #: 4    Treatment Area: L knee    SUBJECTIVE  Pain Level (0-10 scale): 10/10  Any medication changes, allergies to medications, adverse drug reactions, diagnosis change, or new procedure performed?: [x]  No    []  Yes (see summary sheet for update)  Subjective functional status/changes:   []  No changes reported  Pt reported she really couldn't sleep last night due to the pain.    OBJECTIVE  Modality (rationale): Decrease pain  [x]   E-Stim: type 2 channel premod x 15 min     [] att   [x] unatt   [] w/US   [x] w/ice   [] w/heat  []   Traction: [] cerv   [] pelvic   _ lbs x _ min     [] pro   [] sup   [] int   [] const  []   Ultrasound: [] cont   [] pulse    _ W/cm2 x _  min   []   []  []   Iontophoresis: [] take home patch w/ dexamethazone    [] 40mA   [] 80mA                               [] _ mA min w/: [] dexamethazone   [] other:_  []   Ice pack _  min     []  Hot pack _  min     []  Paraffin _  min  []   Vaso Pneumatic Device 15 mins at mod compression at 34 deg   []  Low-level Laser for 4 minutes    Therapeutic Exercise: [x]  see flow sheet     []  Other:_  Added/Changed Exercises:  [x]   Added:Per flow sheet  to improve (function): PROM  []   Changed:_ to improve (function):  (minutes:45)    Patient Education: [x]  Review HEP    []  Progressed/Changed HEP based on:_   []  positioning   []  body mechanics   []  transfers   []  heat/ice application    Other Objective/Functional Measures:       Pain Level (0-10 scale) post treatment: 8/10  ASSESSMENT  []   See Plan of Care  []   See progress note/recertification  [x]   Patient will continue to benefit from skilled therapy to address remaining functional deficits: Sitting tolerance,  standing tolerance, walking tolerance, ADLs      Progress towards goals / Updated goals:  Short Term Goals: To be accomplished in 4 weeks   1) The patient will be independent with introductory HEP   2) The patient will report pain no greater than 8/10 to improve standing tolerance   3) The patient will improve knee flexion AROM to 95 degrees   Long Term Goals: To be accomplished in 12 weeks   1) The patient will demonstrate pain free knee flexion AROM to improve ease with dressing   2) The patient will demonstrate 4+/5 or greater strength in LLE to improve ease with household chores   3) The patient will improve LEFS by 9 points to indicate a significant improvement in function   4) The patient will ambulate with even weight shift for 10 minutes  without an increase in pain to improve her mobility in the community      PLAN  [x]   Upgrade activities as tolerated     [x]   Continue plan of care  []   Discharge due to:_  []  Other:_      Juliahna Wiswell [facility], PTA, CPT 09/01/2012  4:03 PM

## 2012-09-05 NOTE — Progress Notes (Signed)
PT DAILY TREATMENT NOTE    Patient Name: Jody [name]  Date:09/05/2012  DOB: 07-24-1970  [x]   Patient DOB Verified  Payor: CHARITY  Plan: [facility] CHARITY 100%  Product Type: Charity     In time:7:30 AM  Out time:8:50 AM  Total Treatment Time (min): 80  Total Timed Codes (min): 45  1:1 Treatment Time (MC only): -   Visit #: 5    Treatment Area: L knee    SUBJECTIVE  Pain Level (0-10 scale): 10/10  Any medication changes, allergies to medications, adverse drug reactions, diagnosis change, or new procedure performed?: [x]  No    []  Yes (see summary sheet for update)  Subjective functional status/changes:   []  No changes reported  "I showed my doctor a knot in her leg and she suggested that it was fluid under the skin and recommended I continue PT.  My foot has been turning cold and my knee has been hurting really bad.  I have been taking Tylenol every 5-6 hours but it doesn't seem to help."  Pt reports she had some relief from PT at first but now it seems to be getting worse and interrupting her sleep    OBJECTIVE  Modality (rationale): To decrease pain and inflammation  [x]   E-Stim: type 2 channel premod x 15 min     [] att   [x] unatt   [] w/US   [x] w/ice   [] w/heat  []   Traction: [] cerv   [] pelvic   _ lbs x _ min     [] pro   [] sup   [] int   [] const  []   Ultrasound: [] cont   [] pulse    _ W/cm2 x _  min   []   []  []   Iontophoresis: [] take home patch w/ dexamethazone    [] 40mA   [] 80mA                               [] _ mA min w/: [] dexamethazone   [] other:_  []   Ice pack _  min     []  Hot pack _  min     []  Paraffin _  min  []   Vaso Pneumatic Device 15 mins at mod compression at 34 deg   []  Low-level Laser for 4 minutes  X Ice massage to L patellar tendon x 10 minutes    Therapeutic Exercise: [x]  see flow sheet     []  Other:_  Added/Changed Exercises:  [x]   Added:Per flow sheet  to improve (function): PROM  []   Changed:_ to improve (function):  (minutes:45)    Patient Education: [x]  Review HEP    []  Progressed/Changed  HEP based on:_   []  positioning   [x]  body mechanics   []  transfers   [x]  heat/ice application    Other Objective/Functional Measures:  Tenderness to palpation at patellar tendon and quad tendon     Pain Level (0-10 scale) post treatment: 7/10    ASSESSMENT  []   See Plan of Care  []   See progress note/recertification  [x]   Patient will continue to benefit from skilled therapy to address remaining functional deficits: Sitting tolerance, standing tolerance, walking tolerance, ADLs    Progress towards goals / Updated goals:  The patient was limited today by high levels of pain.  The patient was encouraged to massage knee with ice 3x/ day to decrease pain and inflammation.  Standing exercise and recumbent bike will be held next visit due to high  levels of pain.    Short Term Goals: To be accomplished in 4 weeks   1) The patient will be independent with introductory HEP   2) The patient will report pain no greater than 8/10 to improve standing tolerance   3) The patient will improve knee flexion AROM to 95 degrees   Long Term Goals: To be accomplished in 12 weeks   1) The patient will demonstrate pain free knee flexion AROM to improve ease with dressing   2) The patient will demonstrate 4+/5 or greater strength in LLE to improve ease with household chores   3) The patient will improve LEFS by 9 points to indicate a significant improvement in function   4) The patient will ambulate with even weight shift for 10 minutes without an increase in pain to improve her mobility in the community    PLAN  [x]   Upgrade activities as tolerated     [x]   Continue plan of care  []   Discharge due to:_  []  Other:_      Duke Salvia, PT 09/05/2012  4:03 PM

## 2012-09-08 NOTE — Progress Notes (Signed)
PT DAILY TREATMENT NOTE    Patient Name: Jody [name]  Date:09/08/2012  DOB: March 05, 1971  [x]   Patient DOB Verified  Payor: CHARITY  Plan: [facility] CHARITY 100%  Product Type: Charity     In time:8:30 AM  Out time:9:25 AM  Total Treatment Time (min): 55  Total Timed Codes (min): 30  1:1 Treatment Time (MC only): -   Visit #: 6     Treatment Area: L knee    SUBJECTIVE  Pain Level (0-10 scale): 10/10  Any medication changes, allergies to medications, adverse drug reactions, diagnosis change, or new procedure performed?: [x]  No    []  Yes (see summary sheet for update)  Subjective functional status/changes:   []  No changes reported  Pt stated that she is seeing Dr. Su Hilt today and wants to know what is going on and why it isn't getting any better.     OBJECTIVE  Modality (rationale): To decrease pain and inflammation  [x]   E-Stim: type 2 channel premod x 15 min     [] att   [x] unatt   [] w/US   [x] w/ice   [] w/heat  []   Traction: [] cerv   [] pelvic   _ lbs x _ min     [] pro   [] sup   [] int   [] const  []   Ultrasound: [] cont   [] pulse    _ W/cm2 x _  min   []   []  []   Iontophoresis: [] take home patch w/ dexamethazone    [] 40mA   [] 80mA                               [] _ mA min w/: [] dexamethazone   [] other:_  []   Ice pack _  min     []  Hot pack _  min     []  Paraffin _  min  []   Vaso Pneumatic Device 15 mins at mod compression at 34 deg   []  Low-level Laser for 4 minutes  X Ice massage to L patellar tendon x 10 minutes    Therapeutic Exercise: [x]  see flow sheet     []  Other:_  Added/Changed Exercises:  [x]   Added:Per flow sheet  to improve (function): PROM  []   Changed:_ to improve (function):  (minutes:30)    Patient Education: [x]  Review HEP    []  Progressed/Changed HEP based on:_   []  positioning   [x]  body mechanics   []  transfers   [x]  heat/ice application    Other Objective/Functional Measures:  Tenderness to palpation at patellar tendon and quad tendon     Pain Level (0-10 scale) post treatment: 8/10    ASSESSMENT   []   See Plan of Care  []   See progress note/recertification  [x]   Patient will continue to benefit from skilled therapy to address remaining functional deficits: Sitting tolerance, standing tolerance, walking tolerance, ADLs    Progress towards goals / Updated goals:  The patient was limited today by high levels of pain.  The patient was encouraged to massage knee with ice 3x/ day to decrease pain and inflammation.     Short Term Goals: To be accomplished in 4 weeks   1) The patient will be independent with introductory HEP   2) The patient will report pain no greater than 8/10 to improve standing tolerance   3) The patient will improve knee flexion AROM to 95 degrees   Long Term Goals: To be accomplished in 12 weeks  1) The patient will demonstrate pain free knee flexion AROM to improve ease with dressing   2) The patient will demonstrate 4+/5 or greater strength in LLE to improve ease with household chores   3) The patient will improve LEFS by 9 points to indicate a significant improvement in function   4) The patient will ambulate with even weight shift for 10 minutes without an increase in pain to improve her mobility in the community    PLAN  [x]   Upgrade activities as tolerated     [x]   Continue plan of care  []   Discharge due to:_  []  Other:_      Demri Poulton [facility], PTA, CPT 09/08/2012  4:03 PM

## 2012-09-08 NOTE — Progress Notes (Signed)
HPI:  Jody [name] is a 42 y.o. female who presents with left  knee pain. Onset was several months ago after a fall onto her knee.  She was seen 3 weeks ago and received an intra-articular knee injection.  She says that helped reduce her pain but it never went completely away.  She has been going to physical therapy and taking Tylenol which had improved her pain until recently.  She states that 4 days ago her pain worsened again.  It has returned to pre-treatment level.  She has been taking 8 Tylenol daily.  She denies injury or inciting event.  She denies fever and chills.  She is unable to take NSAIDs due to gastric concerns.    Past Medical History   Diagnosis Date   ??? Diabetes    ??? Asthma    ??? Endometrial thickening on ultra sound    ??? Diabetic neuropathy      Per pt diagnosed by a physician a [facility]   ??? Lumbar disc disease    ??? Other ill-defined conditions      high cholesterol   ??? Psychiatric disorder      depression, anxiety   ??? 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 GERD (gastroesophageal reflux disease)    ??? Thyroid disease      Current outpatient prescriptions:acetaminophen (TYLENOL) 325 mg tablet, Take  by mouth every four (4) hours as needed for Pain., Disp: , Rfl: ;  sertraline (ZOLOFT) 100 mg tablet, Take 2 Tabs by mouth daily., Disp: 60 Tab, Rfl: 1;  glipiZIDE (GLUCOTROL) 5 mg tablet, Take 2 tabs by mouth in am, one tab in pm, Disp: 90 Tab, Rfl: 1;  clonazePAM (KLONOPIN) 0.5 mg tablet, Take 1 Tab by mouth two (2) times a day. As needed, Disp: 60 Tab, Rfl: 0  esomeprazole (NEXIUM) 20 mg capsule, Take 1 Cap by mouth daily., Disp: 30 Cap, Rfl: 6;  metFORMIN (GLUCOPHAGE) 1,000 mg tablet, Take 1 Tab by mouth two (2) times daily (with meals)., Disp: 60 Tab, Rfl: 1;  HYDROcodone-acetaminophen (NORCO) 5-325 mg per tablet, Take 1 Tab by mouth every eight (8) hours as needed for Pain., Disp: 5 Tab, Rfl: 0  ALPRAZolam (XANAX) 0.25 mg tablet, Take 1 Tab by mouth three (3) times daily as needed for  Anxiety., Disp: 60 Tab, Rfl: 0;  HYDROcodone-acetaminophen (NORCO) 5-325 mg per tablet, Take 1 Tab by mouth every eight (8) hours as needed for Pain., Disp: 12 Tab, Rfl: 0;  omeprazole (PRILOSEC) 20 mg capsule, Take 20 mg by mouth daily., Disp: , Rfl: ;  ferrous sulfate (IRON) 325 mg (65 mg iron) tablet, Take  by mouth Daily (before breakfast)., Disp: , Rfl:   Allergies   Allergen Reactions   ??? Erythromycin Nausea and Vomiting and Swelling   ??? Levaquin (Levofloxacin) Rash and Swelling   ??? Macrolide Antibiotics Rash, Nausea and Vomiting and Swelling   ??? Nsaids (Non-Steroidal Anti-Inflammatory Drug) Other (comments)     Per pt, was told by GI d/t stomach ulcers   ??? Pcn (Penicillins) Nausea and Vomiting and Swelling   ??? Ultram (Tramadol) Nausea Only     Past Medical History   Diagnosis Date   ??? Diabetes    ??? Asthma    ??? Endometrial thickening on ultra sound    ??? Diabetic neuropathy      Per pt diagnosed by a physician a [facility]   ??? Lumbar disc disease    ???  Other ill-defined conditions      high cholesterol   ??? Psychiatric disorder      depression, anxiety   ??? 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 GERD (gastroesophageal reflux disease)    ??? Thyroid disease      Family History   Problem Relation Age of Onset   ??? Seizures Mother    ??? Seizures Father    ??? Diabetes Mother    ??? Asthma Mother    ??? High Cholesterol Mother    ??? Heart Attack Mother    ??? Hypertension Father    ??? Cancer Father      throat cancer   ??? Heart Attack Father    ??? Hypertension Sister    ??? Hypertension Paternal Grandmother    ??? Diabetes Paternal Grandmother    ??? Breast Cancer Paternal Grandmother    ??? Cancer Paternal Grandfather      leukemia       ROS: As per HPI otherwise negative.    Objective:   BP 135/93   Pulse 90   Temp(Src) 98.2 ??F (36.8 ??C) (Oral)   Ht 5' 5.5" (1.664 m)   Wt 252 lb 12.8 oz (114.669 kg)   BMI 41.41 kg/m2   SpO2 96%   LMP 08/15/2011  Gen: Well appearing.  No apparent distress.  Alert and oriented.  Responds to all  questions appropriately.  Lungs: No labored respirations.  Talking in complete sentences without difficulty.  Musculoskeletal:  Knee: left  Knee Effusion: None  Quadriceps atrophy: None   Popliteal (Baker???s) Cyst: Negative   Patellofemoral crepitus: Negative  Q angle:     ROM:  Flexion: 130  Extension: 0   Hip IR/ER: FROM without pain    Alignment:  Foot:  normal  Patellar tracking: tracks laterally    Dynamic Test:  Gait: Normal   Assistive devices: None    Palpation:   Patella tenderness: positive  Patellar tendon tenderness: Positive  Quad tendon tenderness: Slight  Medial joint line tenderness: Pos  Lateral joint line tenderness: Pos  MCL tenderness: None  LCL tenderness: None  Medial facet tenderness: positive  Lateral facet tenderness: None  Condyle tenderness: None  Tibia tubercle tenderness: None  Proximal fibula tenderness: None  Plica tenderness: None  Prepatellar bursa tenderness: None  Pes bursa tenderness: None  ITB tenderness: None    Ligament/Meniscal Exam:  Patellar Grind: Positive  Patellar apprehension (medial/lateral): Negative   Lochman: Negative, with good endpoint   Anterior Drawer: Negative   Posterior Drawer: Negative   Valgus stress: Negative with good endpoint   Varus stress: Negative with good endpoint   McMurray: Negative     Strength (0-5/5):   Flexion: Left: 4/5    Right: 5/5    Extension: Left: 4/5    Right: 5/5    Hip abduction: 4/5    Hip adduction: 4/5      Neuro/Vascular : Pulses intact, no edema, and neurologically intact .  Skin: No obvious rash or skin breakdown.    [facility]  OFFICE PROCEDURE PROGRESS NOTE        Chart reviewed for the following:   I, Resa Miner, MD, have reviewed the History, Physical and updated the Allergic reactions for Shasta A Randle     TIME OUT performed immediately prior to start of procedure:   I, Resa Miner, MD, have performed the following reviews on Amiel A Normoyle prior to the start of the  procedure:            * Patient  was identified by name and date of birth   * Agreement on procedure being performed was verified  * Risks and Benefits explained to the patient  * Procedure site verified and marked as necessary  * Patient was positioned for comfort  * Consent was signed and verified     PROCEDURE NOTE:     Informed consent obtained verbally and risks, benefits and alternatives discussed.  Time out performed, cross checking patient ID and procedure.  The left knee was Hoffa fat pad were identified with ultrasound and the fat pad was injected from a lateral approach with Celestone 6 mg, 40 mg of dexamethasone,  and 2ml of 1% lidocaine under sterile conditions using ultrasound guidance.  The patient tolerated the procedure well and there were no complications.        ASSESSMENT:    ICD-9-CM    1. 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  Therefore we recommend patient consider applying for the [facility] as a potential alternative to traveling to [facility] for intervention.

## 2012-09-08 NOTE — Progress Notes (Signed)
PT at [facility] this am with no improvement. Pain at level 9

## 2012-09-12 NOTE — Progress Notes (Signed)
[facility]  [address] [place], [address]  [place], [place] [address]  Phone: [phone]      Fax:  [phone]    Progress Note    Name: Jody [name]   DOB: 09/04/1970   MD: Leron Croak, MD       Treatment Diagnosis: PAIN IN JOINT, LOWER LEG  Start of Care: 08/20/12    Visits from Start of Care: 7  Missed Visits: 0    Summary of Care:Therapeutic Exercise, Manual Therapy, Modality, Home Exercise Program, Patient Education    Assessment / Recommendations:   Pt has not been progressing well with therapy. Pt presents with very tender palpation to lateral and medial knee. Pt c/o burning sensation across superior aspect of knee with gd I/II patella mobs. Pt's AROM knee flexion is 75 degrees (90 degrees initial eval). Pt c/o radicular pain down later side of leg to mid calf and mid thigh that is new since Monday. Pt has 2 more visits to be at 9 total visits (4 weeks). Will cont POC unless changed otherwise by MD.       Marcha Solders, PTA, CPT 09/12/2012 8:14 AM    ________________________________________________________________________  NOTE TO PHYSICIAN:  Please complete the following and fax to:  [facility]: [phone]  . Retain this original for your records.  If you are unable to process this request in 24 hours, please contact our office.       ____ I have read the above report and request that my patient continue therapy with the following changes/special instructions:  ____ I have read the above report and request that my patient be discharged from therapy    Physician's Signature:_________________ Date:___________Time:__________

## 2012-09-12 NOTE — Progress Notes (Addendum)
PT DAILY TREATMENT NOTE    Patient Name: Jody [name]  Date:09/12/2012  DOB: 06/20/1970  [x]   Patient DOB Verified  Payor: CHARITY  Plan: [facility] CHARITY 100%  Product Type: Charity     In time:8:05 AM  Out time:8:50 AM  Total Treatment Time (min): 45  Total Timed Codes (min): 30  1:1 Treatment Time (MC only): -   Visit #: 6     Treatment Area: L knee    SUBJECTIVE  Pain Level (0-10 scale): 10/10  Any medication changes, allergies to medications, adverse drug reactions, diagnosis change, or new procedure performed?: [x]  No    []  Yes (see summary sheet for update)  Subjective functional status/changes:   []  No changes reported  Pt stated that she is now having some radiating pain down lateral leg to mid calf and up to mid thigh that wasn't there before. Pt stated she couldn't sleep last night due to it.    OBJECTIVE  Modality (rationale): To decrease pain and inflammation  []   E-Stim: type 2 channel premod x 15 min     [] att   [] unatt   [] w/US   [] w/ice   [] w/heat  []   Traction: [] cerv   [] pelvic   _ lbs x _ min     [] pro   [] sup   [] int   [] const  []   Ultrasound: [] cont   [] pulse    _ W/cm2 x _  min   []   []  []   Iontophoresis: [] take home patch w/ dexamethazone    [] 40mA   [] 80mA                               [] _ mA min w/: [] dexamethazone   [] other:_  []   Ice pack _  min     []  Hot pack _  min     []  Paraffin _  min  [x]   Vaso Pneumatic Device 15 mins at mod compression at 34 deg   []  Low-level Laser for 4 minutes   Ice massage to L patellar tendon x 10 minutes    Therapeutic Exercise: [x]  see flow sheet     []  Other:_  Added/Changed Exercises:  [x]   Added:Per flow sheet  to improve (function): PROM  []   Changed:_ to improve (function):  (minutes:30)    Patient Education: [x]  Review HEP    []  Progressed/Changed HEP based on:_   []  positioning   [x]  body mechanics   []  transfers   [x]  heat/ice application    Other Objective/Functional Measures:  Very Tender to palpation at patellar tendon, medial and lateral  knee.     Pain Level (0-10 scale) post treatment: 8/10    ASSESSMENT  []   See Plan of Care  []   See progress note/recertification  [x]   Patient will continue to benefit from skilled therapy to address remaining functional deficits: Sitting tolerance, standing tolerance, walking tolerance, ADLs    Progress towards goals / Updated goals:  The patient was limited today by high levels of pain.  The patient was encouraged to massage knee with ice 3x/ day to decrease pain and inflammation.     Short Term Goals: To be accomplished in 4 weeks   1) The patient will be independent with introductory HEP   2) The patient will report pain no greater than 8/10 to improve standing tolerance   3) The patient will improve knee flexion AROM to 95 degrees  Long Term Goals: To be accomplished in 12 weeks   1) The patient will demonstrate pain free knee flexion AROM to improve ease with dressing   2) The patient will demonstrate 4+/5 or greater strength in LLE to improve ease with household chores   3) The patient will improve LEFS by 9 points to indicate a significant improvement in function   4) The patient will ambulate with even weight shift for 10 minutes without an increase in pain to improve her mobility in the community    PLAN  [x]   Upgrade activities as tolerated     [x]   Continue plan of care  []   Discharge due to:_  []  Other:_      Sunya Humbarger [facility], PTA, CPT 09/12/2012  4:03 PM

## 2012-09-15 NOTE — Progress Notes (Addendum)
PT DAILY TREATMENT NOTE    Patient Name: Jody [name]  Date:09/15/2012  DOB: [birth year]  [x]   Patient DOB Verified  Payor: CHARITY  Plan: [facility] CHARITY 100%  Product Type: Charity     In time:8:30 AM  Out time:9:20 AM  Total Treatment Time (min): 50   Total Timed Codes (min): 35  1:1 Treatment Time (MC only): -   Visit #: 7     Treatment Area: L knee    SUBJECTIVE  Pain Level (0-10 scale): 8/10  Any medication changes, allergies to medications, adverse drug reactions, diagnosis change, or new procedure performed?: [x]  No    []  Yes (see summary sheet for update)  Subjective functional status/changes:   []  No changes reported  Pt stated that the radiating pain is still there.     OBJECTIVE  Modality (rationale): To decrease pain and inflammation  []   E-Stim: type 2 channel premod x 15 min     [] att   [] unatt   [] w/US   [] w/ice   [] w/heat  []   Traction: [] cerv   [] pelvic   _ lbs x _ min     [] pro   [] sup   [] int   [] const  []   Ultrasound: [] cont   [] pulse    _ W/cm2 x _  min   []   []  []   Iontophoresis: [] take home patch w/ dexamethazone    [] 40mA   [] 80mA                               [] _ mA min w/: [] dexamethazone   [] other:_  []   Ice pack _  min     []  Hot pack _  min     []  Paraffin _  min  [x]   Vaso Pneumatic Device 15 mins at mod compression at 34 deg   []  Low-level Laser for 4 minutes   Ice massage to L patellar tendon x 10 minutes    Therapeutic Exercise: [x]  see flow sheet     []  Other:_  Added/Changed Exercises:  [x]   Added:Per flow sheet  to improve (function): PROM  []   Changed:_ to improve (function):  (minutes:35)    Patient Education: [x]  Review HEP    []  Progressed/Changed HEP based on:_   []  positioning   [x]  body mechanics   []  transfers   [x]  heat/ice application    Other Objective/Functional Measures:  Very Tender to palpation at patellar tendon, medial and lateral knee.     Pain Level (0-10 scale) post treatment: 7/10    ASSESSMENT  []   See Plan of Care  []   See progress note/recertification   [x]   Patient will continue to benefit from skilled therapy to address remaining functional deficits: Sitting tolerance, standing tolerance, walking tolerance, ADLs    Progress towards goals / Updated goals:   Short Term Goals: To be accomplished in 4 weeks   1) The patient will be independent with introductory HEP - MET  2) The patient will report pain no greater than 8/10 to improve standing tolerance - NOT MET  3) The patient will improve knee flexion AROM to 95 degrees - NOT MET   Long Term Goals: To be accomplished in 12 weeks   1) The patient will demonstrate pain free knee flexion AROM to improve ease with dressing   2) The patient will demonstrate 4+/5 or greater strength in LLE to improve ease with household chores  3) The patient will improve LEFS by 9 points to indicate a significant improvement in function   4) The patient will ambulate with even weight shift for 10 minutes without an increase in pain to improve her mobility in the community    PLAN  [x]   Upgrade activities as tolerated     [x]   Continue plan of care  []   Discharge due to:_  []  Other:_      Marcha Solders, PTA, CPT 09/15/2012  4:03 PM

## 2012-09-19 NOTE — Progress Notes (Signed)
PT DAILY TREATMENT NOTE    Patient Name: Mikaelyn A Vanderweele  Date:09/19/2012  DOB: [birth year]  [x]   Patient DOB Verified  Payor: CHARITY  Plan: [facility] CHARITY 100%  Product Type: Charity     In time:9:05 AM  Out time:10:00 AM  Total Treatment Time (min): 55   Total Timed Codes (min): 40  1:1 Treatment Time (MC only): -   Visit #: 9    Treatment Area: L knee    SUBJECTIVE  Pain Level (0-10 scale): 7/10  Any medication changes, allergies to medications, adverse drug reactions, diagnosis change, or new procedure performed?: [x]  No    []  Yes (see summary sheet for update)  Subjective functional status/changes:   []  No changes reported   Patient reports that she walked 1 mile yesterday with 9/10 pain afterwards. Reports that her pain ranges from 8-9/10 with prolonged standing and she continues to have great difficulty with squatting and descending stairs.    OBJECTIVE  Modality (rationale): To decrease pain and inflammation  []   E-Stim: type 2 channel premod x 15 min     [] att   [] unatt   [] w/US   [] w/ice   [] w/heat  []   Traction: [] cerv   [] pelvic   _ lbs x _ min     [] pro   [] sup   [] int   [] const  []   Ultrasound: [] cont   [] pulse    _ W/cm2 x _  min   []   []  []   Iontophoresis: [] take home patch w/ dexamethazone    [] 40mA   [] 80mA                               [] _ mA min w/: [] dexamethazone   [] other:_  []   Ice pack _  min     []  Hot pack _  min     []  Paraffin _  min  [x]   Vaso Pneumatic Device 15 mins at mod compression at 34 deg   []  Low-level Laser for 4 minutes   Ice massage to L patellar tendon x 10 minutes    Therapeutic Exercise: [x]  see flow sheet     []  Other:_  Added/Changed Exercises:  [x]   Added:Per flow sheet  to improve (function): PROM  []   Changed:_ to improve (function):  (minutes:40)    Patient Education: [x]  Review HEP    []  Progressed/Changed HEP based on:_   []  positioning   [x]  body mechanics   []  transfers   [x]  heat/ice application    Other Objective/Functional Measures:  Very Tender to  palpation at distal quadriceps with palpable knot  Left knee AROM flexion: 85 degrees     Pain Level (0-10 scale) post treatment: 7/10    ASSESSMENT  []   See Plan of Care  []   See progress note/recertification  [x]   Patient will continue to benefit from skilled therapy to address remaining functional deficits: Sitting tolerance, standing tolerance, walking tolerance, ADLs    Progress towards goals / Updated goals:   Patient is progressing slowly towards functional goals, with reported improvements in ability to stand and walking for long distances. Will extend to STG's to 6 weeks to allow for continued improvement in knee flexion AROM and standing tolerance.  Short Term Goals: To be accomplished in 4 weeks. Extend to 6 weeks.  1) The patient will be independent with introductory HEP - MET  2) The patient will report pain no greater  than 8/10 to improve standing tolerance - NOT MET  3) The patient will improve knee flexion AROM to 95 degrees - NOT MET   Long Term Goals: To be accomplished in 12 weeks   1) The patient will demonstrate pain free knee flexion AROM to improve ease with dressing   2) The patient will demonstrate 4+/5 or greater strength in LLE to improve ease with household chores   3) The patient will improve LEFS by 9 points to indicate a significant improvement in function   4) The patient will ambulate with even weight shift for 10 minutes without an increase in pain to improve her mobility in the community    PLAN  [x]   Upgrade activities as tolerated     [x]   Continue plan of care  []   Discharge due to:_  []  Other:_      Orvis Brill, PT, DPT   09/19/2012  4:03 PM

## 2012-09-24 LAB — AMB POC URINALYSIS DIP STICK AUTO W/O MICRO
Bilirubin (UA POC): NEGATIVE
Blood (UA POC): NEGATIVE
Glucose (UA POC): NEGATIVE
Ketones (UA POC): NEGATIVE
Leukocyte esterase (UA POC): NEGATIVE
Nitrites (UA POC): NEGATIVE
Protein (UA POC): NEGATIVE mg/dL
Specific gravity (UA POC): 1.025 (ref 1.001–1.035)
Urobilinogen (UA POC): 0.2 (ref 0.2–1)
pH (UA POC): 6.5 (ref 4.6–8.0)

## 2012-09-24 NOTE — Progress Notes (Signed)
Chief Complaint   Patient presents with   ??? Follow-up   ??? Anxiety   ??? Depression     Patient states when she started taking nexium along with her current medications, she started gritting her teeth and has trouble sleeping.    Reviewed record in preparation for visit and have obtained necessary documentation.

## 2012-09-24 NOTE — Progress Notes (Signed)
Hurting in her left knee -- got shots around her knee  Getting therapy  Therapist said that the next step is a MRI  According to Dr. Su Hilt' notes he would recommend MRI also    Ran out of medication a few days ago  Grits her teeth really bad when takes nexium, clonazepam, zoloft at night at the same time    Micah Flesher to ED for blood in her urine -- told that it might be kidney stones -- since she has completed her antibiotics will check urine today for blood    Took her antibiotic as directed     Aware that she needs retinal exam yearly    States that she continues to have anxiety but better   Has been out of zoloft and clonazepam for a few days    Will check blood work today and refill medications    Greater than 50% of this 25 minute visit was counseling about depression, anxiety, knee pain, diabetes.

## 2012-09-24 NOTE — Patient Instructions (Signed)
Anxiety Disorder: After Your Visit  Your Care Instructions  Anxiety is a normal reaction to stress. Difficult situations can cause you to have symptoms such as sweaty palms and a nervous feeling.  In an anxiety disorder, the symptoms are far more severe. Constant worry, muscle tension, trouble sleeping, nausea and diarrhea, and other symptoms can make normal daily activities difficult or impossible. These symptoms may occur for no reason, and they can affect your work, school, or social life. Medicines, counseling, and self-care can all help.  Follow-up care is a key part of your treatment and safety. Be sure to make and go to all appointments, and call your doctor if you are having problems. It's also a good idea to know your test results and keep a list of the medicines you take.  How can you care for yourself at home?  ?? Take medicines exactly as directed. Call your doctor if you think you are having a problem with your medicine.  ?? Go to your counseling sessions and follow-up appointments.  ?? Recognize and accept your anxiety. Then, when you are in a situation that makes you anxious, say to yourself, "This is not an emergency. I feel uncomfortable, but I am not in danger. I can keep going even if I feel anxious."  ?? Be kind to your body:  ?? Relieve tension with exercise or a massage.  ?? Get enough rest.  ?? Avoid alcohol, caffeine, nicotine, and illegal drugs. They can increase your anxiety level and cause sleep problems.  ?? Learn and do relaxation techniques. See below for more about these techniques.  ?? Engage your mind. Get out and do something you enjoy. Go to a funny movie, or take a walk or hike. Plan your day. Having too much or too little to do can make you anxious.  ?? Keep a record of your symptoms. Discuss your fears with a good friend or family member, or join a support group for people with similar problems. Talking to others sometimes relieves stress.  ?? Get involved in social groups, or volunteer  to help others. Being alone sometimes makes things seem worse than they are.  ?? Get at least 30 minutes of exercise on most days of the week to relieve stress. Walking is a good choice. You also may want to do other activities, such as running, swimming, cycling, or playing tennis or team sports.  Relaxation techniques  Do relaxation exercises 10 to 20 minutes a day. You can play soothing, relaxing music while you do them, if you wish.  ?? Tell others in your house that you are going to do your relaxation exercises. Ask them not to disturb you.  ?? Find a comfortable place, away from all distractions and noise.  ?? Lie down on your back, or sit with your back straight.  ?? Focus on your breathing. Make it slow and steady.  ?? Breathe in through your nose. Breathe out through either your nose or mouth.  ?? Breathe deeply, filling up the area between your navel and your rib cage. Breathe so that your belly goes up and down.  ?? Do not hold your breath.  ?? Breathe like this for 5 to 10 minutes. Notice the feeling of calmness throughout your whole body.  As you continue to breathe slowly and deeply, relax by doing the following for another 5 to 10 minutes:  ?? Tighten and relax each muscle group in your body. You can begin at your toes and work   your way up to your head.  ?? Imagine your muscle groups relaxing and becoming heavy.  ?? Empty your mind of all thoughts.  ?? Let yourself relax more and more deeply.  ?? Become aware of the state of calmness that surrounds you.  ?? When your relaxation time is over, you can bring yourself back to alertness by moving your fingers and toes and then your hands and feet and then stretching and moving your entire body. Sometimes people fall asleep during relaxation, but they usually wake up shortly afterward.  ?? Always give yourself time to return to full alertness before you drive a car or do anything that might cause an accident if you are not fully alert. Never play a relaxation tape while  you drive a car.  When should you call for help?  Call 911 anytime you think you may need emergency care. For example, call if:  ?? You feel you cannot stop from hurting yourself or someone else.  Watch closely for changes in your health, and be sure to contact your doctor if:  ?? You have anxiety or fear that affects your life.  ?? You have symptoms of anxiety that are new or different from those you had before.   Where can you learn more?   Go to [email or web address]  Enter P754 in the search box to learn more about "Anxiety Disorder: After Your Visit."   ?? 2006-2014 [employer]. Care instructions adapted under license by [employer] (which disclaims liability or warranty for this information). This care instruction is for use with your licensed healthcare professional. If you have questions about a medical condition or this instruction, always ask your healthcare professional. [employer], Inc[employer] disclaims any warranty or liability for your use of this information.  Content Version: 10.0.270728; Last Revised: November 06, 2011              Self-Care While You Recover From Depression: After Your Visit  Your Care Instructions  Taking good care of yourself is important as you recover from depression. In time, your symptoms will fade as your treatment takes hold. Do not give up. Instead, focus your energy on getting better.  Your mood will improve. It just takes some time. Focus on things that can help you feel better, such as being with friends and family, eating well, and getting enough rest. But take things slowly. Do not do too much too soon. You will begin to feel better gradually.  Follow-up care is a key part of your treatment and safety. Be sure to make and go to all appointments, and call your doctor if you are having problems. It's also a good idea to know your test results and keep a list of the medicines you take.  How can you care for yourself at home?  Be realistic  ?? If you  have a large task to do, break it up into smaller steps you can handle, and just do what you can.  ?? You may want to put off important decisions until your depression has lifted. If you have plans that will have a major impact on your life, such as marriage, divorce, or a job change, try to wait a bit. Talk it over with friends and loved ones who can help you look at the overall picture first.  ?? Reaching out to people for help is important. Do not isolate yourself. Let your family and friends help you. Find someone you can trust and confide in, and talk  to that person.  ?? Be patient, and be kind to yourself. Remember that depression is not your fault and is not something you can overcome with willpower alone. Treatment is necessary for depression, just like for any other illness. Feeling better takes time, and your mood will improve little by little.  Stay active  ?? Stay busy and get outside. Take a walk, or try some other light exercise.  ?? Talk with your doctor about an exercise program. Exercise can help with mild depression.  ?? Go to a movie or concert. Take part in a church activity or other social gathering. Go to a ball game.  ?? Ask a friend to have dinner with you.  Take care of yourself  ?? Eat a balanced diet with plenty of fresh fruits and vegetables, whole grains, and lean protein. If you have lost your appetite, eat small snacks rather than large meals.  ?? Avoid drinking alcohol or using illegal drugs. Do not take medicines that have not been prescribed for you. They may interfere with medicines you may be taking for depression, or they may make your depression worse.  ?? Take your medicines exactly as they are prescribed. You may start to feel better within 1 to 3 weeks of taking antidepressant medicine. But it can take as many as 6 to 8 weeks to see more improvement. If you have questions or concerns about your medicines, or if you do not notice any improvement by 3 weeks, talk to your doctor.  ?? If  you have any side effects from your medicine, tell your doctor. Antidepressants can make you feel tired, dizzy, or nervous. Some people have dry mouth, constipation, headaches, sexual problems, or diarrhea. Many of these side effects are mild and will go away on their own after you have been taking the medicine for a few weeks. Some may last longer. Talk to your doctor if side effects are bothering you too much. You might be able to try a different medicine.  ?? Get enough sleep. If you have problems sleeping:  ?? Go to bed at the same time every night, and get up at the same time every morning.  ?? Keep your bedroom dark and quiet.  ?? Do not exercise after 5:00 p.m.  ?? Avoid drinks with caffeine after 5:00 p.m.  ?? Avoid sleeping pills unless they are prescribed by the doctor treating your depression. Sleeping pills may make you groggy during the day, and they may interact with other medicine you are taking.  ?? If you have any other illnesses, such as diabetes, heart disease, or high blood pressure, make sure to continue with your treatment. Tell your doctor about all of the medicines you take, including those with or without a prescription.  When should you call for help?  Call 911 anytime you think you may need emergency care. For example, call if:  ?? You feel like hurting yourself or someone else.  ?? Someone you know has depression and is about to attempt or is attempting suicide.  Call your doctor now or seek immediate medical care if:  ?? You hear voices.  ?? Someone you know has depression and:  ?? Starts to give away his or her possessions.  ?? Uses illegal drugs or drinks alcohol heavily.  ?? Talks or writes about death, including writing suicide notes or talking about guns, knives, or pills.  ?? Starts to spend a lot of time alone.  ?? Acts very aggressively or suddenly appears  calm.  Watch closely for changes in your health, and be sure to contact your doctor if:  ?? You do not get better as expected.   Where can you  learn more?   Go to [email or web address]  Enter N529 in the search box to learn more about "Self-Care While You Recover From Depression: After Your Visit."   ?? 2006-2014 [employer]. Care instructions adapted under license by [employer] (which disclaims liability or warranty for this information). This care instruction is for use with your licensed healthcare professional. If you have questions about a medical condition or this instruction, always ask your healthcare professional. [employer], [employer] disclaims any warranty or liability for your use of this information.  Content Version: 10.0.270728; Last Revised: April 13, 2010

## 2012-09-25 LAB — HEMOGLOBIN A1C WITH EAG: Hemoglobin A1c: 8 % — ABNORMAL HIGH (ref 4.8–5.6)

## 2012-09-26 MED ORDER — ALBUTEROL SULFATE 0.083 % (0.83 MG/ML) SOLN FOR INHALATION
2.5 mg /3 mL (0.083 %) | PACK | RESPIRATORY_TRACT | Status: AC | PRN
Start: 2012-09-26 — End: ?

## 2012-09-26 MED ORDER — FLUTICASONE-SALMETEROL 230 MCG-21 MCG/ACTUATION AEROSOL INHALER
230-21 mcg/actuation | Freq: Two times a day (BID) | RESPIRATORY_TRACT | Status: DC
Start: 2012-09-26 — End: 2013-01-13

## 2012-09-26 NOTE — Progress Notes (Signed)
Chief Complaint   Patient presents with   ??? Asthma     states she needs to be dx with asthma     Reviewed record in preparation for visit and have obtained necessary documentation.

## 2012-09-26 NOTE — Progress Notes (Signed)
PT DAILY TREATMENT NOTE    Patient Name: Jody [name]  Date:09/26/2012  DOB: [birth year]  [x]   Patient DOB Verified  Payor: CHARITY  Plan: [facility] CHARITY 100%  Product Type: Charity     In time:7:55 AM  Out time:8:50 AM  Total Treatment Time (min): 55  Total Timed Codes (min): 40  1:1 Treatment Time (MC only): -   Visit #: 10    Treatment Area: L knee    SUBJECTIVE  Pain Level (0-10 scale): 7/10  Any medication changes, allergies to medications, adverse drug reactions, diagnosis change, or new procedure performed?: [x]  No    []  Yes (see summary sheet for update)  Subjective functional status/changes:   []  No changes reported   Patient reports that she attempted to lift a tv and pulled her back. She stated she had a lot of pain that radiated down the R side to her foot.     OBJECTIVE  Modality (rationale): To decrease pain and inflammation  [x]   E-Stim: type 2 channel premod x 15 min     [] att   [] unatt   [] w/US   [] w/ice   [x] w/heat to Back  []   Traction: [] cerv   [] pelvic   _ lbs x _ min     [] pro   [] sup   [] int   [] const  []   Ultrasound: [] cont   [] pulse    _ W/cm2 x _  min   []   []  []   Iontophoresis: [] take home patch w/ dexamethazone    [] 40mA   [] 80mA                               [] _ mA min w/: [] dexamethazone   [] other:_  []   Ice pack _  min     []  Hot pack _  min     []  Paraffin _  min  [x]   Vaso Pneumatic Device 15 mins at mod compression at 34 deg   []  Low-level Laser for 4 minutes   Ice massage to L patellar tendon x 10 minutes    Therapeutic Exercise: [x]  see flow sheet     []  Other:_  Added/Changed Exercises:  [x]   Added:Per flow sheet  to improve (function): PROM  []   Changed:_ to improve (function):  (minutes:40)    Patient Education: [x]  Review HEP    []  Progressed/Changed HEP based on:_   []  positioning   [x]  body mechanics   []  transfers   [x]  heat/ice application    Other Objective/Functional Measures:  Very Tender to palpation at distal quadriceps with palpable knot  Left knee AROM flexion:  85 degrees     Pain Level (0-10 scale) post treatment: 7/10    ASSESSMENT  []   See Plan of Care  []   See progress note/recertification  [x]   Patient will continue to benefit from skilled therapy to address remaining functional deficits: Sitting tolerance, standing tolerance, walking tolerance, ADLs    Progress towards goals / Updated goals:   Patient is progressing slowly towards functional goals, with reported improvements in ability to stand and walking for long distances. Will extend to STG's to 6 weeks to allow for continued improvement in knee flexion AROM and standing tolerance.  Short Term Goals: To be accomplished in 4 weeks. Extend to 6 weeks.  1) The patient will be independent with introductory HEP - MET  2) The patient will report pain no greater than 8/10  to improve standing tolerance - NOT MET  3) The patient will improve knee flexion AROM to 95 degrees - NOT MET   Long Term Goals: To be accomplished in 12 weeks   1) The patient will demonstrate pain free knee flexion AROM to improve ease with dressing   2) The patient will demonstrate 4+/5 or greater strength in LLE to improve ease with household chores   3) The patient will improve LEFS by 9 points to indicate a significant improvement in function   4) The patient will ambulate with even weight shift for 10 minutes without an increase in pain to improve her mobility in the community    PLAN  [x]   Upgrade activities as tolerated     [x]   Continue plan of care  []   Discharge due to:_  []  Other:_      Sidonie Dexheimer [place] [facility], PTA, CPT   09/26/2012  4:03 PM

## 2012-09-26 NOTE — Progress Notes (Signed)
Chief Complaint   Patient presents with   ??? Asthma     states she needs to be dx with asthma     Subjective:   Jody [name] is an 42 y.o. female for evaluation of asthma. Pt states that she had been hospitalized for this in her 14s for asthma excerebration but never have a formal diagnosis.   Pt is taking albuterol nebulizer as needed and she is a current a pack a day smoker. She has been using neb (inhaler is too expensive) more frequently and thought due to the weather, and her cigarette smoking. Pt denies any cough, nausea, vomiting, sob, or dyspnea, or fever. She was given prescription for Advair but it was too expensive.     Review of Systems  Constitutional: Negative fever/chill or wt. changes  HENT: Negative for hearing loss and congestion.  Eyes: Negative for blurred vision, pain, discharge and redness.  Respiratory: positive for wheezing.  Cardiovascular: Negative for chest pain, palpitations, and orthopnea.  Gastrointestinal:  Negative for nausea, vomiting, abdominal pain, diarrhea, and constipation.  Skin:  Negative skin lesions.     Objective:     BP 116/75   Pulse 87   Temp(Src) 99.3 ??F (37.4 ??C) (Oral)   Resp 18   Ht 5' 5.5" (1.664 m)   Wt 257 lb (116.574 kg)   BMI 42.1 kg/m2   SpO2 95%   LMP 08/15/2011    General:  alert, cooperative, no distress, appears stated age   HEENT:  ENT exam normal, no neck nodes or sinus tenderness   Lungs: + scant expiratory wheezing noted.    Heart:  regular rate and rhythm, S1, S2 normal, no murmur, click, rub or gallop   Extremities: extremities normal, atraumatic, no cyanosis or edema     Assessment/Plan:   Jody [name] was seen today for asthma.    Diagnoses and associated orders for this visit:    Asthma - her scant wheezing noted on physical exam. I think her symptoms are related to COPD rather than asthma as she is a chronic smoker (1PPD), I will refer her to Pul for form PFT done, and also going to start her on Advair in addition to albuterol.   - fluticasone-salmeterol  (ADVAIR) 250-50 mcg/dose diskus inhaler; Take 1 Puff by inhalation every twelve (12) hours.  - REFERRAL TO PULMONARY DISEASE  - albuterol (PROVENTIL VENTOLIN) 2.5 mg /3 mL (0.083 %) nebulizer solution; 3 mL by Nebulization route every four (4) hours as needed for Wheezing.    COPD (chronic obstructive pulmonary disease)  - fluticasone-salmeterol (ADVAIR) 250-50 mcg/dose diskus inhaler; Take 1 Puff by inhalation every twelve (12) hours.  - REFERRAL TO PULMONARY DISEASE  - albuterol (PROVENTIL VENTOLIN) 2.5 mg /3 mL (0.083 %) nebulizer solution; 3 mL by Nebulization route every four (4) hours as needed for Wheezing.    Smoker - chronic, 1PPD  - Counseled regard to smoking cessation. Pt is not ready to quit.     F/u in 1 month

## 2012-09-26 NOTE — Progress Notes (Signed)
Quick Note:    No blood in urine   However her blood glucose is in poor control    Needs to take her medications, watch her diet, exercise -- recheck HgbA1C in three months   If still elevated, may need to go on insulin  ______

## 2012-09-29 NOTE — Progress Notes (Signed)
Quick Note:    TC made to pt, unable to reach pt. LMOVM for pt to call back re her lab results.  ______

## 2012-09-29 NOTE — Progress Notes (Signed)
I reviewed the patient's medical history, the resident's findings on physical examination, the patient's diagnoses, and treatment plan as documented in the resident note.  I concur with the treatment plan as documented.  Additional suggestions noted.

## 2012-09-30 NOTE — Telephone Encounter (Signed)
Pt came in to the office today stating she received a phone call from this office and that she did not here the message until after she had already arrived here.  Advised her that I had called her to give her her lab results and recommendations per Dr. Robby Sermon.  Pt states that she was under the impression that Dr. Robby Sermon and her had already discussed starting her on insulin, and pt states although she does not like needles she is willing to go on the insulin.  Advised pt that taking insulin takes more commitment that will involve daily home BS monitoring.  Pt verbalized understanding and states is still considering insulin.  She states she is tired of taking so many oral medications as well.     On another issue, pt also complained of knee pain that she states Tylenol Extra Strength QID is not helping.  She is asking if Dr. Robby Sermon would consider prescribing her a pain reliever that is "stronger than the amount of Tylenol" she's taking until she can be seen by Dr. Su Hilt, who is currently out of the office.  Inform pt that a message can be sent out to Dr. Robby Sermon for her to review.  Pt verbalized understanding.

## 2012-09-30 NOTE — Progress Notes (Signed)
Quick Note:    Unable to reach pt, LM to call back. Result letter w/ Dr. Robby Sermon result note mailed to pt.    ______

## 2012-09-30 NOTE — Progress Notes (Signed)
PT DAILY TREATMENT NOTE    Patient Name: Jody [name]  Date:09/30/2012  DOB: November 25, 1970  [x]   Patient DOB Verified  Payor:   CARE CARD  In time:10:25 AM  Out time: 11:20 AM  Total Treatment Time (min): 55  Total Timed Codes (min): 40  1:1 Treatment Time (MC only): -   Visit #: 11    Treatment Area: L knee    SUBJECTIVE  Pain Level (0-10 scale): 9/10  Any medication changes, allergies to medications, adverse drug reactions, diagnosis change, or new procedure performed?: [x]  No    []  Yes (see summary sheet for update)  Subjective functional status/changes:   []  No changes reported   Patient reports that she continues to have significant L knee pain and calf pain.  Pt reports she climbed a lot of stairs in an attempt to make her knee feel better.  Pt was educated on the importance of not doing activities which significantly increase her pain  OBJECTIVE  Modality (rationale): To decrease pain and inflammation  []   E-Stim: type 2 channel premod x  min     [] att   [] unatt   [] w/US   [] w/ice   [] w/heat to Back  []   Traction: [] cerv   [] pelvic   _ lbs x _ min     [] pro   [] sup   [] int   [] const  []   Ultrasound: [] cont   [] pulse    _ W/cm2 x _  min   []   []  []   Iontophoresis: [] take home patch w/ dexamethazone    [] 40mA   [] 80mA                               [] _ mA min w/: [] dexamethazone   [] other:_  []   Ice pack _  min     [x]  Hot pack 15_  min to low back    []  Paraffin _  min  [x]   Vaso Pneumatic Device 15 mins at mod compression at 34 deg   []  Low-level Laser for 4 minutes   Ice massage to L patellar tendon x 10 minutes    Therapeutic Exercise: [x]  see flow sheet     []  Other:_  Added/Changed Exercises:  [x]   Added:Per flow sheet  to improve (function): PROM  []   Changed:_ to improve (function):  (minutes:40)    Patient Education: [x]  Review HEP    []  Progressed/Changed HEP based on:_   []  positioning   [x]  body mechanics   []  transfers   [x]  heat/ice application    Other Objective/Functional Measures:  Pt with  significant pain during all attempts at therapeutic exercise this visit     Pain Level (0-10 scale) post treatment: 5/10    ASSESSMENT  []   See Plan of Care  []   See progress note/recertification  [x]   Patient will continue to benefit from skilled therapy to address remaining functional deficits: Sitting tolerance, standing tolerance, walking tolerance, ADLs    Progress towards goals / Updated goals:   Today's treatment session was limited by high levels of pain from moving furniture and climbing steps.    Short Term Goals: To be accomplished in 4 weeks. Extend to 6 weeks.  1) The patient will be independent with introductory HEP - MET  2) The patient will report pain no greater than 8/10 to improve standing tolerance - NOT MET  3) The patient will improve knee flexion AROM  to 95 degrees - NOT MET   Long Term Goals: To be accomplished in 12 weeks   1) The patient will demonstrate pain free knee flexion AROM to improve ease with dressing   2) The patient will demonstrate 4+/5 or greater strength in LLE to improve ease with household chores   3) The patient will improve LEFS by 9 points to indicate a significant improvement in function   4) The patient will ambulate with even weight shift for 10 minutes without an increase in pain to improve her mobility in the community    PLAN  [x]   Upgrade activities as tolerated     [x]   Continue plan of care  []   Discharge due to:_  []  Other:_      Duke Salvia, PT 09/30/2012  4:03 PM

## 2012-10-01 MED ADMIN — HYDROcodone-acetaminophen (NORCO) 5-325 mg per tablet 1 Tab: ORAL | @ 17:00:00 | NDC 00406036523

## 2012-10-01 MED FILL — HYDROCODONE-ACETAMINOPHEN 5 MG-325 MG TAB: 5-325 mg | ORAL | Qty: 1

## 2012-10-01 NOTE — ED Notes (Signed)
Patient  discharged with instructions per Dr Ladona Ridgel. Pt will get one Hydrocodone prior to discharge.

## 2012-10-01 NOTE — ED Provider Notes (Addendum)
HPI Comments: The patient complains of low back pain of 2 weeks' duration that came on after she lifted a TV. The patient is mechanical and occasionally radiates to the right leg. She has been getting physical therapy for this pain and one of her exercises calls a "pop" in her back and has resulted in worsening pain. She denies bowel/bladder symptoms, abdominal pain. 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 This pain is no worse than it has been. She has been taking Tylenol with no relief of her pain.    Patient is a 42 y.o. female presenting with back pain and knee pain.   Back Pain   Pertinent negatives include no chest pain, no fever, no headaches, no abdominal pain and no dysuria.   Knee Pain   Associated symptoms include back pain.        Past Medical History   Diagnosis Date   ??? Diabetes    ??? Asthma    ??? Endometrial thickening on ultra sound    ??? Diabetic neuropathy      Per pt diagnosed by a physician a [facility]   ??? Lumbar disc disease    ??? Other ill-defined conditions      high cholesterol   ??? Psychiatric disorder      depression, anxiety   ??? 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 GERD (gastroesophageal reflux disease)    ??? Thyroid disease         Past Surgical History   Procedure Laterality Date   ??? Hx orthopaedic       back   ??? Hx appendectomy     ??? Pr hysteroscopy,w/endo bx N/A 02/06/2012     OPERATIVE HYSTEROSCOPY, RESECTION SUBMUCOSSAL FIBROIDS performed by Eyvonne Mechanic, MD at [facility] MAIN OR   ??? Hx tubal ligation  1994   ??? Hx gyn  03/05/12     TLH/BSO/Lymphadenectomy         Family History   Problem Relation Age of Onset   ??? Seizures Mother    ??? Seizures Father    ??? Diabetes Mother    ??? Asthma Mother    ??? High Cholesterol Mother    ??? Heart Attack Mother    ??? Hypertension Father    ??? Cancer Father      throat cancer   ??? Heart Attack Father    ??? Hypertension Sister    ??? Hypertension Paternal Grandmother    ??? Diabetes Paternal Grandmother     ??? Breast Cancer Paternal Grandmother    ??? Cancer Paternal Grandfather      leukemia        History     Social History   ??? Marital Status: MARRIED     Spouse Name: N/A     Number of Children: N/A   ??? Years of Education: N/A     Occupational History   ??? Not on file.     Social History Main Topics   ??? Smoking status: Current Every Day Smoker -- 0.50 packs/day for 10 years     Types: Cigarettes     Last Attempt to Quit: 07/11/2010   ??? Smokeless tobacco: Never Used   ??? Alcohol Use: No   ??? Drug Use: No   ??? Sexually Active: Yes -- Female partner(s)     Birth Control/ Protection: Surgical      Comment: BTL 19 years ago  Other Topics Concern   ??? Not on file     Social History Narrative   ??? No narrative on file                  ALLERGIES: Erythromycin; Levaquin; Macrolide antibiotics; Nsaids (non-steroidal anti-inflammatory drug); Pcn; and Ultram      Review of Systems   Constitutional: Negative for fever.   HENT: Negative for neck stiffness.    Respiratory: Negative for cough, shortness of breath and wheezing.    Cardiovascular: Negative for chest pain and leg swelling.   Gastrointestinal: Negative for nausea, vomiting, abdominal pain and diarrhea.   Genitourinary: Negative for dysuria.   Musculoskeletal: Positive for back pain and arthralgias.   Skin: Negative for rash.   Neurological: Negative.  Negative for syncope and headaches.   Psychiatric/Behavioral: Negative for confusion.       Filed Vitals:    10/01/12 1140   BP: 144/67   Pulse: 75   Temp: 97.8 ??F (36.6 ??C)   Resp: 16   Height: 5\' 7"  (1.702 m)   Weight: 116.574 kg (257 lb)   SpO2: 96%            Physical Exam   Nursing note and vitals reviewed.  Constitutional: She appears well-developed and well-nourished. No distress.   HENT:   Head: Normocephalic.   Neck: Normal range of motion.   Cardiovascular: Normal rate and regular rhythm.    No murmur heard.  Pulmonary/Chest: Effort normal and breath sounds normal. No respiratory distress.   Abdominal: Soft. There is  no tenderness.   Musculoskeletal: Normal range of motion. She exhibits no edema.   Back is ttp in lower lumbar area. l knee is ns/nt.    Neurological: She is alert.   Skin: Skin is warm and dry.        MDM    Procedures    Discussed test results, clinical impression,  and need for followup in 1-2 days if not improving.  Patients questions were answered. Discharge instructions given to patient.

## 2012-10-01 NOTE — Progress Notes (Signed)
PT DAILY TREATMENT NOTE    Patient Name: Jody [name]  Date:10/01/2012  DOB: 07-May-1970  [x]   Patient DOB Verified  Payor: SELF PAY  Plan: [facility] SELF PAY  Product Type: Self Pay   CARE CARD  In time:9:30 AM  Out time: 10:25 AM  Total Treatment Time (min): 55  Total Timed Codes (min): 40  1:1 Treatment Time (MC only): -   Visit #: 12    Treatment Area: L knee    SUBJECTIVE  Pain Level (0-10 scale): 8/10  Any medication changes, allergies to medications, adverse drug reactions, diagnosis change, or new procedure performed?: [x]  No    []  Yes (see summary sheet for update)  Subjective functional status/changes:   []  No changes reported   Patient reports that her left knee pain is better, however today her low back has increased pain.    OBJECTIVE  Modality (rationale): To decrease pain and inflammation  [x]   E-Stim: type 2 channel premod x 15 min     [] att   [x] unatt   [] w/US   [] w/ice   [x] w/heat to Back  []   Traction: [] cerv   [] pelvic   _ lbs x _ min     [] pro   [] sup   [] int   [] const  []   Ultrasound: [] cont   [] pulse    _ W/cm2 x _  min   []   []  []   Iontophoresis: [] take home patch w/ dexamethazone    [] 40mA   [] 80mA                               [] _ mA min w/: [] dexamethazone   [] other:_  []   Ice pack _  min     [x]  Hot pack 15_  min to low back    []  Paraffin _  min  [x]   Vaso Pneumatic Device 15 mins at mod compression at 34 deg   []  Low-level Laser for 4 minutes   Ice massage to L patellar tendon x 10 minutes    Therapeutic Exercise: [x]  see flow sheet     []  Other:_  Added/Changed Exercises:  [x]   Added:Per flow sheet  to improve (function): PROM  []   Changed:_ to improve (function):  (minutes:40)    Patient Education: [x]  Review HEP    []  Progressed/Changed HEP based on:_   []  positioning   [x]  body mechanics   []  transfers   [x]  heat/ice application    Other Objective/Functional Measures:  Pt with significant pain during all attempts at therapeutic exercise this visit     Pain Level (0-10 scale) post  treatment: 5/10    ASSESSMENT  []   See Plan of Care  []   See progress note/recertification  [x]   Patient will continue to benefit from skilled therapy to address remaining functional deficits: Sitting tolerance, standing tolerance, walking tolerance, ADLs    Progress towards goals / Updated goals:   Patient experienced increased pain with PPT and LTR, but did report decreased pain overall at the end of session.    Short Term Goals: To be accomplished in 4 weeks. Extend to 6 weeks.  1) The patient will be independent with introductory HEP - MET  2) The patient will report pain no greater than 8/10 to improve standing tolerance - NOT MET  3) The patient will improve knee flexion AROM to 95 degrees - NOT MET   Long Term Goals: To be accomplished  in 12 weeks   1) The patient will demonstrate pain free knee flexion AROM to improve ease with dressing   2) The patient will demonstrate 4+/5 or greater strength in LLE to improve ease with household chores   3) The patient will improve LEFS by 9 points to indicate a significant improvement in function   4) The patient will ambulate with even weight shift for 10 minutes without an increase in pain to improve her mobility in the community    PLAN  [x]   Upgrade activities as tolerated     [x]   Continue plan of care  []   Discharge due to:_  []  Other:_      Orvis Brill, PT, DPT   10/01/2012  4:03 PM

## 2012-10-01 NOTE — ED Notes (Signed)
Dr Taylor in to examine pt.

## 2012-10-01 NOTE — ED Notes (Signed)
Pt presents to ED with c/o 2 week hx of back pain after lifting TV. Pt also c/o ongoing left knee pain since 05/2012. No change in bowel/bladder habits.

## 2012-10-02 NOTE — Telephone Encounter (Signed)
Patient called to say had spoken with you today.  She contacted Dr Glean Hess office and was told they do not accept the [facility] care card,  ([facility])

## 2012-10-02 NOTE — Telephone Encounter (Signed)
Pt needs to discuss need for stronger pain medication with Dr. Su Hilt for her knee pain.

## 2012-10-02 NOTE — Progress Notes (Signed)
Patient listed on hospital discharge [facility]) report dated for 10/01/12.  Patient seen in [facility] for back strain.  When asking to speak to patient, person that picked up phone answered by saying"what up" I waited for an adult to come to the phone, this did not occur. According to the system patient has an appointment schedule for 10/07/12

## 2012-10-06 NOTE — Progress Notes (Signed)
PT DAILY TREATMENT NOTE    Patient Name: Jody [name]  Date:10/06/2012  DOB: 07-31-1970  [x]   Patient DOB Verified  Payor: SELF PAY  Plan: [facility] SELF PAY  Product Type: Self Pay   CARE CARD  In time:9:35 AM  Out time: 10:20 AM  Total Treatment Time (min): 45  Total Timed Codes (min): 30   1:1 Treatment Time (MC only): -   Visit #: 13    Treatment Area: L knee    SUBJECTIVE  Pain Level (0-10 scale): 8/10 Knee 6/10 back  Any medication changes, allergies to medications, adverse drug reactions, diagnosis change, or new procedure performed?: [x]  No    []  Yes (see summary sheet for update)  Subjective functional status/changes:   []  No changes reported   Patient reports that she went to the ED last week after last visit due to her back hurting more after doing the LTR and hearing a pop.     OBJECTIVE  Modality (rationale): To decrease pain and inflammation  [x]   E-Stim: type 2 channel premod x 15 min     [] att   [x] unatt   [] w/US   [x] w/ice to knee  [x] w/heat to Back  []   Traction: [] cerv   [] pelvic   _ lbs x _ min     [] pro   [] sup   [] int   [] const  []   Ultrasound: [] cont   [] pulse    _ W/cm2 x _  min   []   []  []   Iontophoresis: [] take home patch w/ dexamethazone    [] 40mA   [] 80mA                               [] _ mA min w/: [] dexamethazone   [] other:_  []   Ice pack _  min     []  Hot pack 15_  min to low back    []  Paraffin _  min  []   Vaso Pneumatic Device 15 mins at mod compression at 34 deg   []  Low-level Laser for 4 minutes   Ice massage to L patellar tendon x 10 minutes    Therapeutic Exercise: [x]  see flow sheet     []  Other:_  Added/Changed Exercises:  [x]   Added:Per flow sheet  to improve (function): PROM  []   Changed:_ to improve (function):  (minutes:30)    Patient Education: [x]  Review HEP    []  Progressed/Changed HEP based on:_   []  positioning   [x]  body mechanics   []  transfers   [x]  heat/ice application    Other Objective/Functional Measures:  Pt unable to perform HS set due to pain in front of  knee and down to the foot.     Pain Level (0-10 scale) post treatment: 6/10 knee and back    ASSESSMENT  []   See Plan of Care  []   See progress note/recertification  [x]   Patient will continue to benefit from skilled therapy to address remaining functional deficits: Sitting tolerance, standing tolerance, walking tolerance, ADLs    Progress towards goals / Updated goals:   Patient experienced increased pain with PPT and LTR, but did report decreased pain overall at the end of session.    Short Term Goals: To be accomplished in 4 weeks. Extend to 6 weeks.  1) The patient will be independent with introductory HEP - MET  2) The patient will report pain no greater than 8/10 to improve standing tolerance -  NOT MET  3) The patient will improve knee flexion AROM to 95 degrees - NOT MET   Long Term Goals: To be accomplished in 12 weeks   1) The patient will demonstrate pain free knee flexion AROM to improve ease with dressing   2) The patient will demonstrate 4+/5 or greater strength in LLE to improve ease with household chores   3) The patient will improve LEFS by 9 points to indicate a significant improvement in function   4) The patient will ambulate with even weight shift for 10 minutes without an increase in pain to improve her mobility in the community    PLAN  [x]   Upgrade activities as tolerated     [x]   Continue plan of care  []   Discharge due to:_  []  Other:_      Jody [name], PTA, CPT  10/06/2012  4:03 PM

## 2012-10-07 NOTE — Telephone Encounter (Signed)
Pt is to be seen by Dr. Robby Sermon for her diabetes and is to be seen by Dr. Su Hilt for her knee pain

## 2012-10-08 NOTE — Progress Notes (Signed)
Chief Complaint   Patient presents with   ??? Medication Evaluation     Pt stated of med change to insulin

## 2012-10-08 NOTE — Progress Notes (Signed)
Still complaining of knee pain    Here to discuss use of insulin -- currently taking metformin and glucotrol -- HgbA1C = 8.0  States that she is taking her medications    [facility]    After patient has been taking her blood glucoses for one week, will have her followup with me to review log    Greater than 50% of this 15 minute visit was counseling about diabetes, glucose control.

## 2012-10-10 NOTE — Progress Notes (Signed)
PT DAILY TREATMENT NOTE    Patient Name: Jody [name]  Date:10/10/2012  DOB: [birth year]  [x]   Patient DOB Verified  Payor: SELF PAY  Plan: [facility] SELF PAY  Product Type: Self Pay   CARE CARD  In time:9:50 AM  Out time: 9:50 AM  Total Treatment Time (min): 60  Total Timed Codes (min): 40   1:1 Treatment Time (MC only): -   Visit #: 14    Treatment Area: L knee    SUBJECTIVE  Pain Level (0-10 scale):6/10  Any medication changes, allergies to medications, adverse drug reactions, diagnosis change, or new procedure performed?: [x]  No    []  Yes (see summary sheet for update)  Subjective functional status/changes:   []  No changes reported   Patient reports that the pain is not as constant as it was before, pt reports a slight improvement in pain    OBJECTIVE  Modality (rationale): To decrease pain and inflammation  [x]   E-Stim: type 2 channel premod x 15 min     [] att   [x] unatt   [] w/US   [x] w/ice to knee  [x] w/heat to Back  []   Traction: [] cerv   [] pelvic   _ lbs x _ min     [] pro   [] sup   [] int   [] const  []   Ultrasound: [] cont   [] pulse    _ W/cm2 x _  min   []   []  []   Iontophoresis: [] take home patch w/ dexamethazone    [] 40mA   [] 80mA                               [] _ mA min w/: [] dexamethazone   [] other:_  []   Ice pack _  min     []  Hot pack 15_  min to low back    []  Paraffin _  min  []   Vaso Pneumatic Device 15 mins at mod compression at 34 deg   []  Low-level Laser for 4 minutes   Ice massage to L patellar tendon x 10 minutes    Therapeutic Exercise: [x]  see flow sheet     []  Other:_  Added/Changed Exercises:  [x]   Added:Per flow sheet  to improve (function): PROM  []   Changed:_ to improve (function):  (minutes:40 )    Patient Education: [x]  Review HEP    []  Progressed/Changed HEP based on:_   []  positioning   [x]  body mechanics   []  transfers   [x]  heat/ice application    Other Objective/Functional Measures:  Pt with an increase in low back following lumbar stretching     Pain Level (0-10 scale) post  treatment: 2/10 low back, 0/10 knee    ASSESSMENT  []   See Plan of Care  []   See progress note/recertification  [x]   Patient will continue to benefit from skilled therapy to address remaining functional deficits: Sitting tolerance, standing tolerance, walking tolerance, ADLs    Progress towards goals / Updated goals:   Patient continues to require verbal cues to complete exercises with correct form and postural awareness.  Patient with a decrease in pain following today's interventions    Short Term Goals: To be accomplished in 4 weeks. Extend to 6 weeks.  1) The patient will be independent with introductory HEP - MET  2) The patient will report pain no greater than 8/10 to improve standing tolerance - NOT MET  3) The patient will improve knee flexion AROM to  95 degrees - NOT MET   Long Term Goals: To be accomplished in 12 weeks   1) The patient will demonstrate pain free knee flexion AROM to improve ease with dressing   2) The patient will demonstrate 4+/5 or greater strength in LLE to improve ease with household chores   3) The patient will improve LEFS by 9 points to indicate a significant improvement in function   4) The patient will ambulate with even weight shift for 10 minutes without an increase in pain to improve her mobility in the community    PLAN  [x]   Upgrade activities as tolerated     [x]   Continue plan of care  []   Discharge due to:_  []  Other:_      Duke Salvia, PT  10/10/2012  4:03 PM

## 2012-10-13 ENCOUNTER — Encounter

## 2012-10-13 NOTE — Telephone Encounter (Signed)
TC from pt, ID verified.  Pt states that she believes she slept wrong b/c she woke up this morning w/ neck pain that is radiating down her shoulder.  States that she is in a lot of pain.  She is not able to use any topical muscle relaxers d/t having a s/e swelling when using them.  She also has is not able to take NSAID's d/t her stomach ulcers.  Advised pt that at this moment we did not have any available appt's, an appt can be scheduled for there to be seen tomorrow, and that if she were to begin feeling worse she can go to the ER.  Pt verbalized understanding and an appt was scheduled for her to be seen tomorrow morning w/ Dr. Forrestine Him.

## 2012-10-13 NOTE — Progress Notes (Signed)
PT DAILY TREATMENT NOTE    Patient Name: Jody [name] Date  Date:10/13/2012  DOB: 04-07-1970  [x]   Patient DOB Verified  Payor: SELF PAY  Plan: [facility] SELF PAY  Product Type: Self Pay   CARE CARD  In time:3:10 AM  Out time: 4:30 AM  Total Treatment Time (min): 80  Total Timed Codes (min): 65   1:1 Treatment Time (MC only): -   Visit #: 15    Treatment Area: L knee    SUBJECTIVE  Pain Level (0-10 scale):10/10 neck 6/10 back 7/10 Knee  Any medication changes, allergies to medications, adverse drug reactions, diagnosis change, or new procedure performed?: [x]  No    []  Yes (see summary sheet for update)  Subjective functional status/changes:   []  No changes reported   Patient reports that she woke up this morning with her neck hurting really bad.    OBJECTIVE  Modality (rationale): To decrease pain and inflammation  [x]   E-Stim: type 2 channel premod x 15 min     [] att   [x] unatt   [] w/US   [x] w/ice to knee and neck  [x] w/heat to Back  []   Traction: [] cerv   [] pelvic   _ lbs x _ min     [] pro   [] sup   [] int   [] const  []   Ultrasound: [] cont   [] pulse    _ W/cm2 x _  min   []   []  []   Iontophoresis: [] take home patch w/ dexamethazone    [] 40mA   [] 80mA                               [] _ mA min w/: [] dexamethazone   [] other:_  []   Ice pack _  min     []  Hot pack 15_  min to low back    []  Paraffin _  min  []   Vaso Pneumatic Device 15 mins at mod compression at 34 deg   []  Low-level Laser for 4 minutes   Ice massage to L patellar tendon x 10 minutes    Therapeutic Exercise: [x]  see flow sheet     []  Other:_  Added/Changed Exercises:  [x]   Added:Per flow sheet  to improve (function): PROM  []   Changed:_ to improve (function):  (minutes:40 )    Manual Therapy:  MET for facet joint correction  Manual Traction  STM to suboccipitals and cervical paraspinals    Patient Education: [x]  Review HEP    []  Progressed/Changed HEP based on:_   []  positioning   [x]  body mechanics   []  transfers   [x]  heat/ice application    Other  Objective/Functional Measures:     Pain Level (0-10 scale) post treatment: "everything feels better especially the neck."    ASSESSMENT  []   See Plan of Care  []   See progress note/recertification  [x]   Patient will continue to benefit from skilled therapy to address remaining functional deficits: Sitting tolerance, standing tolerance, walking tolerance, ADLs    Progress towards goals / Updated goals:   Patient continues to require verbal cues to complete exercises with correct form and postural awareness.  Patient with [name] decrease in pain following today's interventions    Short Term Goals: To be accomplished in 4 weeks. Extend to 6 weeks.  1) The patient will be independent with introductory HEP - MET  2) The patient will report pain no greater than 8/10 to improve standing tolerance -  NOT MET  3) The patient will improve knee flexion AROM to 95 degrees - NOT MET   Long Term Goals: To be accomplished in 12 weeks   1) The patient will demonstrate pain free knee flexion AROM to improve ease with dressing   2) The patient will demonstrate 4+/5 or greater strength in LLE to improve ease with household chores   3) The patient will improve LEFS by 9 points to indicate [name] significant improvement in function   4) The patient will ambulate with even weight shift for 10 minutes without an increase in pain to improve her mobility in the community    PLAN  [x]   Upgrade activities as tolerated     [x]   Continue plan of care  []   Discharge due to:_  []  Other:_      Gaynel Schaafsma [place], PTA, CPT  10/13/2012  4:03 PM

## 2012-10-16 NOTE — Progress Notes (Addendum)
PT DAILY TREATMENT NOTE    Patient Name: Jody [name]  Date:10/16/2012  DOB: 03-23-1971  [x]   Patient DOB Verified  Payor: SELF PAY  Plan: [facility] SELF PAY  Product Type: Self Pay   CARE CARD  In time:3:00 AM  Out time: 3:50 AM  Total Treatment Time (min): 50  Total Timed Codes (min): 35   1:1 Treatment Time (MC only): -   Visit #: 15    Treatment Area: L knee    SUBJECTIVE  Pain Level (0-10 scale):4/10 neck 5/10 back 9/10 Knee  Any medication changes, allergies to medications, adverse drug reactions, diagnosis change, or new procedure performed?: [x]  No    []  Yes (see summary sheet for update)  Subjective functional status/changes:   []  No changes reported   Patient reports that her neck feels much better but her knee is killing her.    OBJECTIVE  Modality (rationale): To decrease pain and inflammation  [x]   E-Stim: type 2 channel premod x 15 min     [] att   [x] unatt   [] w/US   [x] w/ice to knee   [x] w/heat to Back and neck  []   Traction: [] cerv   [] pelvic   _ lbs x _ min     [] pro   [] sup   [] int   [] const  []   Ultrasound: [] cont   [] pulse    _ W/cm2 x _  min   []   []  []   Iontophoresis: [] take home patch w/ dexamethazone    [] 40mA   [] 80mA                               [] _ mA min w/: [] dexamethazone   [] other:_  []   Ice pack _  min     []  Hot pack 15_  min to low back    []  Paraffin _  min  []   Vaso Pneumatic Device 15 mins at mod compression at 34 deg   []  Low-level Laser for 4 minutes   Ice massage to L patellar tendon x 10 minutes    Therapeutic Exercise: [x]  see flow sheet     []  Other:_  Added/Changed Exercises:  [x]   Added:Per flow sheet  to improve (function): PROM  []   Changed:_ to improve (function):  (minutes:35 )    Patient Education: [x]  Review HEP    []  Progressed/Changed HEP based on:_   []  positioning   [x]  body mechanics   []  transfers   [x]  heat/ice application    Other Objective/Functional Measures:  Knee flexion - 114     Pain Level (0-10 scale) post treatment: "better"    ASSESSMENT  Pt  demonstrated increase in knee flexion AROM today. Pt is progress slow with therapy but continues to show improvement.  []   See Plan of Care  []   See progress note/recertification  [x]   Patient will continue to benefit from skilled therapy to address remaining functional deficits: Sitting tolerance, standing tolerance, walking tolerance, ADLs    Progress towards goals / Updated goals:   Short Term Goals: To be accomplished in 4 weeks. Extend to 6 weeks.  1) The patient will be independent with introductory HEP - MET  2) The patient will report pain no greater than 8/10 to improve standing tolerance - NOT MET  3) The patient will improve knee flexion AROM to 95 degrees -  MET (114 on 10/16/12)  Long Term Goals: To be accomplished in  12 weeks   1) The patient will demonstrate pain free knee flexion AROM to improve ease with dressing   2) The patient will demonstrate 4+/5 or greater strength in LLE to improve ease with household chores   3) The patient will improve LEFS by 9 points to indicate a significant improvement in function -Progressing towards (+2 points)  4) The patient will ambulate with even weight shift for 10 minutes without an increase in pain to improve her mobility in the community    PLAN  [x]   Upgrade activities as tolerated     [x]   Continue plan of care  []   Discharge due to:_  []  Other:_      Charlii Yost [place], PTA, CPT  10/16/2012  4:03 PM

## 2012-10-17 ENCOUNTER — Encounter

## 2012-10-17 NOTE — Progress Notes (Signed)
[facility]  [address] [place], [address]  [place], [place] [address]  Phone: [phone]      Fax:  [phone]    Progress Note    Name: Jody [name]   DOB: May 25, 1970   MD: Leron Croak, MD       Treatment Diagnosis: PAIN IN JOINT, LOWER LEG  Start of Care: 08/20/12    Visits from Start of Care: 16  Missed Visits: 0    Summary of Care:Therapeutic Exercise, Manual Therapy, Modality, Home Exercise Program, Patient Education      Assessment / Recommendations:   Pt is progressing slow but still c/o high levels of pain in L knee/thigh. Pt has improve knee flexion AROM to 114 degrees (90 degrees at initial eval). Will cont with POC 2x week for 4 weeks to improve mobility and knee AROM      Jody [name], PTA, CPT 10/17/2012 3:26 PM    ________________________________________________________________________  NOTE TO PHYSICIAN:  Please complete the following and fax to:  [facility]: [phone]  . Retain this original for your records.  If you are unable to process this request in 24 hours, please contact our office.       ____ I have read the above report and request that my patient continue therapy with the following changes/special instructions:  ____ I have read the above report and request that my patient be discharged from therapy    Physician's Signature:_________________ Date:___________Time:__________

## 2012-10-31 NOTE — Telephone Encounter (Signed)
Pt walked in to the office and requested med refills of metformin, glipizide, sertraline, and clonazepam.  Also, she stated that she believes that the [facility] does not accept the Care Card.  She states she did not confirm it they take the Care Card.  Advised pt to call their office and inform them of her having the Care Card to see if they accept it.  Regarding her refills, discussed them w/ Dr. Robby Sermon, she gave a VO for all for prescriptions (see Medications list for details w/ today's date).

## 2012-11-05 NOTE — Progress Notes (Signed)
HPI:  Jody [name] is a 42 y.o. female who presents with left knee pain.  Pain is chronic.  Doing PT for 2 months with mild improvement. Taking tylenol.  Can't take NSAIDs due to past gastritis.  Reports doing home exercises.  Occasional walking.  Using ice.  No new injury or trauma.  No swelling.  MRI demonstrated patella chondromalacia but no ligament or meniscal injury.     Past Medical History   Diagnosis Date   ??? Diabetes    ??? Asthma    ??? Endometrial thickening on ultra sound    ??? Diabetic neuropathy      Per pt diagnosed by a physician a [facility]   ??? Lumbar disc disease    ??? Other ill-defined conditions      high cholesterol   ??? Psychiatric disorder      depression, anxiety   ??? 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 GERD (gastroesophageal reflux disease)    ??? Thyroid disease      Current outpatient prescriptions:PSEUDOEPHEDRINE/ACETAMINOPHEN (TYLENOL SINUS PO), Take  by mouth., Disp: , Rfl: ;  celecoxib (CELEBREX) 100 mg capsule, Take 1 Cap by mouth two (2) times a day., Disp: 60 Cap, Rfl: 2;  clonazePAM (KLONOPIN) 0.5 mg tablet, Take 1 Tab by mouth two (2) times a day. As needed, Disp: 60 Tab, Rfl: 0;  metFORMIN (GLUCOPHAGE) 1,000 mg tablet, Take 1 Tab by mouth two (2) times daily (with meals)., Disp: 60 Tab, Rfl: 1  glipiZIDE (GLUCOTROL) 5 mg tablet, Take 2 tabs by mouth in am, one tab in pm, Disp: 90 Tab, Rfl: 1;  sertraline (ZOLOFT) 100 mg tablet, Take 2 Tabs by mouth daily., Disp: 60 Tab, Rfl: 0;  fluticasone-salmeterol (ADVAIR HFA) 230-21 mcg/actuation inhaler, Take 2 Puffs by inhalation two (2) times a day., Disp: 8 g, Rfl: 0;  esomeprazole (NEXIUM) 20 mg capsule, Take 1 Cap by mouth daily., Disp: 30 Cap, Rfl: 6  fluticasone-salmeterol (ADVAIR) 250-50 mcg/dose diskus inhaler, Take 1 Puff by inhalation every twelve (12) hours., Disp: 1 Inhaler, Rfl: 2;  albuterol (PROVENTIL VENTOLIN) 2.5 mg /3 mL (0.083 %) nebulizer solution, 3 mL by Nebulization route every four (4) hours as needed for Wheezing.,  Disp: 1 Package, Rfl: 2;  acetaminophen (TYLENOL) 325 mg tablet, Take  by mouth every four (4) hours as needed for Pain., Disp: , Rfl:   omeprazole (PRILOSEC) 20 mg capsule, Take 20 mg by mouth daily., Disp: , Rfl: ;  ferrous sulfate (IRON) 325 mg (65 mg iron) tablet, Take  by mouth Daily (before breakfast)., Disp: , Rfl:   Allergies   Allergen Reactions   ??? Erythromycin Nausea and Vomiting and Swelling   ??? Levaquin (Levofloxacin) Rash and Swelling   ??? Macrolide Antibiotics Rash, Nausea and Vomiting and Swelling   ??? Nsaids (Non-Steroidal Anti-Inflammatory Drug) Other (comments)     Per pt, was told by GI d/t stomach ulcers   ??? Pcn (Penicillins) Nausea and Vomiting and Swelling   ??? Ultram (Tramadol) Nausea Only     Past Medical History   Diagnosis Date   ??? Diabetes    ??? Asthma    ??? Endometrial thickening on ultra sound    ??? Diabetic neuropathy      Per pt diagnosed by a physician a [facility]   ??? Lumbar disc disease    ??? Other ill-defined conditions      high cholesterol   ??? Psychiatric disorder  depression, anxiety   ??? 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 GERD (gastroesophageal reflux disease)    ??? Thyroid disease      Family History   Problem Relation Age of Onset   ??? Seizures Mother    ??? Seizures Father    ??? Diabetes Mother    ??? Asthma Mother    ??? High Cholesterol Mother    ??? Heart Attack Mother    ??? Hypertension Father    ??? Cancer Father      throat cancer   ??? Heart Attack Father    ??? Hypertension Sister    ??? Hypertension Paternal Grandmother    ??? Diabetes Paternal Grandmother    ??? Breast Cancer Paternal Grandmother    ??? Cancer Paternal Grandfather      leukemia       ROS: As per HPI otherwise negative.    Objective:   BP 118/76   Pulse 90   Temp(Src) 99.2 ??F (37.3 ??C) (Oral)   Resp 16   Ht 5\' 6"  (1.676 m)   Wt 253 lb (114.76 kg)   BMI 40.85 kg/m2   LMP 08/15/2011  Gen: Well appearing.  No apparent distress.  Alert and oriented.  Responds to all questions appropriately.  Lungs: No labored  respirations.  Talking in complete sentences without difficulty.  Musculoskeletal:  Knee: left  Knee Effusion: None    ROM:  Flexion: 110  Extension: 0   Hip IR/ER: FROM without pain    Dynamic Test:  Gait: Normal   Assistive devices: None    Palpation:   Patella tenderness: None  Patellar tendon tenderness: None  Quad tendon tenderness: None  Medial joint line tenderness: tender  Lateral joint line tenderness: none  MCL tenderness: None  LCL tenderness: None  Medial facet tenderness: tender  Lateral facet tenderness: tender  Condyle tenderness: None  Tibia tubercle tenderness: None  Proximal fibula tenderness: None  Plica tenderness: None  Prepatellar bursa tenderness: None  Pes bursa tenderness: None  ITB tenderness: None    Ligament/Meniscal Exam:  Patellar Grind: positive     Patellar apprehension (medial/lateral): Negative   Valgus stress: Negative with good endpoint   Varus stress: Negative with good endpoint     Strength (0-5/5):   Flexion: Left: 5/5    Right: 5/5    Extension: Left: 5/5    Right: 5/5    Hip abduction: 5/5    Hip adduction: 5/5      Neuro/Vascular : Pulses intact, no edema, and neurologically intact .  Skin: No obvious rash or skin breakdown.      ASSESSMENT:    ICD-9-CM   1. Knee pain 719.46   2. Patellofemoral pain syndrome, left 719.46       PLAN:    PLAN:    Continue Home Exercise Program and Physical Therapy    Ice 15 minutes, three times a day PRN and after exercise.     Medications:    1. Celebrex 100mg  BID  2. Acetaminophen (Tylenol): [ 500mg  1-2 tablets every 6 hours as needed for pain.       F/U: PRN

## 2012-11-05 NOTE — Progress Notes (Signed)
Chief Complaint   Patient presents with   ??? Knee Pain     fell 05/2012     Reviewed record in preparation for visit and have obtained the necessary documentation.

## 2012-11-11 NOTE — Progress Notes (Signed)
Pt has been to the ER or hospital since opening this episode. Patient has not followed up with PCP and/or speciality physicians. Closing episode.

## 2012-11-18 NOTE — Progress Notes (Signed)
Patient has been notified of medication assistance program. Patient will have to come to office to fill out forms so items can be faxed.

## 2012-11-18 NOTE — Progress Notes (Signed)
F/u with patient concerning celebrex medication. Voice mail message has been left. Will await return call.

## 2012-11-18 NOTE — Progress Notes (Signed)
Call made to [employer] Patient Assistance Program in effort to obtain information on celebrex coverage for patient, which they stated that they do offer this medication. Patient must first apply to see if she qualifies to the program. Voicemail message has been left, will attempt to call again to notify of the terms.

## 2012-11-20 NOTE — Progress Notes (Signed)
Notified patient that she will have to come to office to sign papers for RX assistance program and bring tax information. Patient stated that she will come on Monday 11/24/12

## 2012-11-25 NOTE — Progress Notes (Signed)
Pt has not been to the ER or hospital since opening this episode. Patient has followed up with PCP and/or speciality physicians. Closing episode.

## 2012-11-27 ENCOUNTER — Encounter

## 2012-11-28 LAB — AMB POC GLUCOSE BLOOD, BY GLUCOSE MONITORING DEVICE: Glucose POC: 183 mg/dL

## 2012-11-28 NOTE — Discharge Instructions (Addendum)
[facility]  [address], [address]  [place], [place] [address]  Phone: [phone] Fax: [phone]        Discharge Summary      Name: Sharmel Ballantine Shonka   DOB: [birth year]   MD: Leron Croak, MD         Treatment Diagnosis: PAIN IN JOINT, LOWER LEG  Start of Care: 08/20/12    Visits from Start of Care: 16  Missed Visits: 3  Reporting Period: 08/20/12 to 11/28/12      Summary of Care: Therapeutic Exercise,  Manual Therapy, Electrical Stimulation, Vaso-pneumatic Compression,  Patient Education, Home Exercise Program     Assessment / Recommendations:   Discontinue therapy due to lack of attendance or compliance.      Duke Salvia, PT 11/28/2012 2:47 PM      ________________________________________________________________________  [facility]: [phone]   Please retain this original for your records.

## 2012-11-28 NOTE — Progress Notes (Signed)
Patient came up to office to fill out enrollment information for Rx assistance program, she was also asked to bring proof of income which she did not bring, patient was given forms to complete and asked to return with proof of income as soon as possible. During this time patient c/o not feeling well and requested to have her sugar checked, Dr. Robby Sermon has authorized the order patients glucose reading was 183 per lab tech patient jumped up and stated "oh that's good"

## 2012-12-06 NOTE — Telephone Encounter (Signed)
Pt states her BS is 263 today.  Instructed her to come in to be seen for evaluation of Diabetes.  Appt scheduled.

## 2013-01-01 NOTE — Telephone Encounter (Signed)
Patient currently has a dull migraine going on 4 weeks now. She has sensitivity to the light and nausea. Patient has vomited three times within the last week. She has tried OTC tylenol without relief. Advised patient that physician will not be in office for the next two days. She can schedule an appointment to have this evaluated sooner. Patient declines appointment at this time. She states that she does not have transportation at this time. Requesting message sent to the physician.

## 2013-01-01 NOTE — Telephone Encounter (Signed)
Pt called to request to be called something into the pharmacy for her migraine headaches that she has been having for about 4 weeks straight everyday, all day.  Please call and advise pt if something that will ben called into the pharmacy for her.    Headaches have been so bad that she can't stand light, and causing her to vomit.      Thanks,

## 2013-01-02 LAB — METABOLIC PANEL, BASIC
Anion gap: 6 mmol/L (ref 5–15)
BUN/Creatinine ratio: 18 (ref 12–20)
BUN: 13 MG/DL (ref 6–20)
CO2: 30 mmol/L (ref 21–32)
Calcium: 8.9 MG/DL (ref 8.5–10.1)
Chloride: 105 mmol/L (ref 97–108)
Creatinine: 0.71 MG/DL (ref 0.45–1.15)
GFR est AA: >60 mL/min/{1.73_m2} (ref >60)
GFR est non-AA: >60 mL/min/{1.73_m2} (ref >60)
Glucose: 164 mg/dL — ABNORMAL HIGH (ref 65–100)
Potassium: 4.4 mmol/L (ref 3.5–5.1)
Sodium: 141 mmol/L (ref 136–145)

## 2013-01-02 MED ORDER — DIPHENHYDRAMINE HCL 50 MG/ML IJ SOLN
50 mg/mL | INTRAMUSCULAR | Status: AC
Start: 2013-01-02 — End: 2013-01-02
  Administered 2013-01-02: 21:00:00 via INTRAVENOUS

## 2013-01-02 MED ORDER — DOXYCYCLINE HYCLATE 100 MG TAB
100 mg | ORAL_TABLET | Freq: Two times a day (BID) | ORAL | Status: AC
Start: 2013-01-02 — End: 2013-01-12

## 2013-01-02 MED ORDER — SODIUM CHLORIDE 0.9% BOLUS IV
0.9 % | Freq: Once | INTRAVENOUS | Status: AC
Start: 2013-01-02 — End: 2013-01-02
  Administered 2013-01-02: 19:00:00 via INTRAVENOUS

## 2013-01-02 MED ORDER — TRAMADOL 50 MG TAB
50 mg | ORAL_TABLET | Freq: Four times a day (QID) | ORAL | Status: DC | PRN
Start: 2013-01-02 — End: 2013-01-08

## 2013-01-02 MED ADMIN — ketorolac (TORADOL) injection 30 mg: INTRAVENOUS | @ 21:00:00 | NDC 00409379501

## 2013-01-02 MED ADMIN — ioversol (OPTIRAY) 320 mg iodine/mL contrast injection 100 mL: INTRAVENOUS | @ 20:00:00 | NDC 00019132311

## 2013-01-02 MED ADMIN — prochlorperazine (COMPAZINE) injection 10 mg: INTRAVENOUS | @ 20:00:00 | NDC 55390007710

## 2013-01-02 NOTE — ED Provider Notes (Addendum)
HPI Comments: 42 y/o F hx DMII, ovarian CA, depression and anxiety presents with 4 weeks L side HA, described as migraine - + photophobia, no neck pain or fever, taking tylenol without improvement, has never been evaluated by neurology, hx migraines, none in about 4 years.  No trauma/rash/sick contacts or travel. Hx TAH/BSO.  + tobacco.    Patient is a 42 y.o. female presenting with headaches.   Headache   Pertinent negatives include no fever.        Past Medical History   Diagnosis Date   ??? Endometrial thickening on ultra sound    ??? Diabetic neuropathy      Per pt diagnosed by a physician a [facility]   ??? Lumbar disc disease    ??? 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 Asthma    ??? GERD (gastroesophageal reflux disease)    ??? Diabetes    ??? Thyroid disease    ??? Psychiatric disorder      depression, anxiety        Past Surgical History   Procedure Laterality Date   ??? Hx orthopaedic       back   ??? Hx appendectomy     ??? Pr hysteroscopy,w/endo bx N/A 02/06/2012     OPERATIVE HYSTEROSCOPY, RESECTION SUBMUCOSSAL FIBROIDS performed by Eyvonne Mechanic, MD at [facility] MAIN OR   ??? Hx tubal ligation  1994   ??? Hx gyn  03/05/12     TLH/BSO/Lymphadenectomy         Family History   Problem Relation Age of Onset   ??? Seizures Mother    ??? Seizures Father    ??? Diabetes Mother    ??? Asthma Mother    ??? High Cholesterol Mother    ??? Heart Attack Mother    ??? Hypertension Father    ??? Cancer Father      throat cancer   ??? Heart Attack Father    ??? Hypertension Sister    ??? Hypertension Paternal Grandmother    ??? Diabetes Paternal Grandmother    ??? Breast Cancer Paternal Grandmother    ??? Cancer Paternal Grandfather      leukemia        History     Social History   ??? Marital Status: MARRIED     Spouse Name: N/A     Number of Children: N/A   ??? Years of Education: N/A     Occupational History   ??? Not on file.     Social History Main Topics   ??? Smoking status: Current Every Day Smoker -- 0.50 packs/day for 10 years     Types: Cigarettes     Last Attempt  to Quit: 07/11/2010   ??? Smokeless tobacco: Never Used   ??? Alcohol Use: No   ??? Drug Use: No   ??? Sexually Active: Yes -- Female partner(s)     Birth Control/ Protection: Surgical      Comment: BTL 19 years ago     Other Topics Concern   ??? Not on file     Social History Narrative   ??? No narrative on file                  ALLERGIES: Erythromycin; Levaquin; Macrolide antibiotics; Nsaids (non-steroidal anti-inflammatory drug); Pcn; and Ultram      Review of Systems   Constitutional: Negative for fever and chills.   HENT: Positive for congestion.    Eyes: Positive for  photophobia.   Neurological: Positive for headaches.   All other systems reviewed and are negative.        Filed Vitals:    01/02/13 1439   BP: 138/65   Pulse: 83   Temp: 98.1 ??F (36.7 ??C)   Resp: 18   Height: 5\' 6"  (1.676 m)   Weight: 112.81 kg (248 lb 11.2 oz)   SpO2: 97%            Physical Exam   Nursing note and vitals reviewed.  Constitutional: She is oriented to person, place, and time. She appears well-developed and well-nourished.   HENT:   Head: Normocephalic and atraumatic.   Nose: Right sinus exhibits no maxillary sinus tenderness. Left sinus exhibits maxillary sinus tenderness.   Mouth/Throat: Oropharynx is clear and moist.   Eyes: Conjunctivae and EOM are normal. Pupils are equal, round, and reactive to light.   Neck: Normal range of motion. Neck supple.   Cardiovascular: Normal rate, regular rhythm and normal heart sounds.    Pulmonary/Chest: Effort normal and breath sounds normal.   Abdominal: Soft. Bowel sounds are normal.   Musculoskeletal: Normal range of motion.   Neurological: She is alert and oriented to person, place, and time.   Skin: Skin is warm. No rash noted. No erythema.   Psychiatric: She has a normal mood and affect.        MDM     Differential Diagnosis; Clinical Impression; Plan:     Hx CA with 4 weeks HA will CT head for concern for possible metastases, normal neuro exam.    CT reviewed, likely concomitant HA with sinusitis,  after tx patient's ride has arrived and she would like to sleep, appears more comfortable, no longer wearing sunglasses, speaking w/o e/o pain, [place] - given hx PUD/concern for narcotic use, will provide brief course toradol and tx for sinusitis.       Procedures

## 2013-01-02 NOTE — ED Notes (Signed)
Pt has had no adverse drug reactions from medications given via IV route at this time, pt alert,with no c/o of pain at this time.Pt alert and oriented times three with transportation present and both parties are aware that pt can not drive for the next 4-6 hours with this medication in their system. Pt gait steady, pt released to husbandPt has received verbal and written discharge instructions per Fillinger PA at this time. Pt has no further questions at time of discharge.

## 2013-01-02 NOTE — ED Notes (Signed)
42 y.o.  Pt states onset of symptoms 4 weeks noted pain front of head on left side, pt stated that she he is nauseous, vomiting and loss of appetite  pt states that pain level at this time is 10/10 pt denies any relief form otc extra strength tylenol x2  Pt states that she has not had a migraine in 5 years

## 2013-01-05 NOTE — ED Provider Notes (Signed)
I was personally available for consultation in the emergency department.  I have reviewed the chart and agree with the documentation recorded by the MLP, including the assessment, treatment plan, and disposition.  Cruz Devilla D Juri Dinning, MD

## 2013-01-05 NOTE — Progress Notes (Signed)
Patient listed on hospital discharge [facility]) report. On 01/02/2013, patient presented to the [facility] ED for acute onset of Headaches.Patient stated onset of symptoms noted 4 weeks prior to ED visit, pain front of head on left side, patient stated that she was nauseous, vomiting and loss of appetite upon arrival. Patient stated that pain level at arrival time was 10/10 pt denies any relief form otc extra strength tylenol x2.  CT of the head showed no acute intracranial hemorrhage, enhancing mass or infarct. She was discharged with order for doxycycline 100 mg tablet, fluticasone 50 mcg/actuation nasal spray and tramadol 50 mg.    Attempted to contact patient in efforts to schedule an appointment to address ED visit and gaps in care. Unable to reach patient by phone, However I was able to leave a voice message advising to call the office to facilitate a follow up. This patient is also due for Microalbumin, eye exam, foot exam and Influenza vaccines.    Marland Kitchen

## 2013-01-06 NOTE — Telephone Encounter (Signed)
Patient calling back in regards to previous message that is noted by Bonita Quin T) nurse. Informed patient per nurse notes, that Dr. Robby Sermon is not here today but will be here tomorrow.  Noted to patient per nurse notes, that she can schedule appt to be evaluated sooner if she feels bad. Patient noted that she was seen at [facility] 01/02/13 for migraines and that the cause is do to her sinuses. Patient states here eyes are running and she is having a little  blurred vision and asking if there's anything she can do to help with this? Patient is scheduled for tomorrow with Dr. Pryor Ochoa at 2:45 pm.    thanks

## 2013-01-06 NOTE — Progress Notes (Signed)
Have patient followup prn

## 2013-01-07 NOTE — Patient Instructions (Signed)
Saline Nasal Washes: After Your Visit  Your Care Instructions  Saline nasal washes help keep the nasal passages open by washing out thick or dried mucus. This simple remedy can help relieve symptoms of allergies, sinusitis, and colds. It also can make the nose feel more comfortable by keeping the mucous membranes moist. You may notice a little burning sensation in your nose the first few times you use the solution, but this usually gets better in a few days.  Follow-up care is a key part of your treatment and safety. Be sure to make and go to all appointments, and call your doctor if you are having problems. It???s also a good idea to know your test results and keep a list of the medicines you take.  How can you care for yourself at home?  ?? You can buy premixed saline solution in a squeeze bottle or other sinus rinse products at a drugstore. Read and follow the instructions on the label.  ?? You also can make your own saline solution by adding 1 teaspoon of salt and 1 teaspoon of baking soda to 2 cups of distilled water.  ?? If you use a homemade solution, pour a small amount into a clean bowl. Using a rubber bulb syringe, squeeze the syringe and place the tip in the salt water. Pull a small amount of the salt water into the syringe by relaxing your hand.  ?? Sit down with your head tilted slightly back. Do not lie down. Put the tip of the bulb syringe or the squeeze bottle a little way into one of your nostrils. Gently squirt a small amount (about 1 teaspoon) into the nostril. Repeat with the other nostril. Some sneezing and gagging are normal at first.  ?? Gently blow your nose.  ?? Wipe the syringe or bottle tip clean after each use.  ?? Repeat this 2 or 3 times a day.  ?? Use nasal washes gently if you have nosebleeds often.  When should you call for help?  Watch closely for changes in your health, and be sure to contact your doctor if:  ?? You often get nosebleeds.  ?? You have problems doing the nasal washes.   Where  can you learn more?   Go to http://www.healthwise.net/[facility]  Enter B784 in the search box to learn more about "Saline Nasal Washes: After Your Visit."   ?? 2006-2014 Health[employer]. Care instructions adapted under license by [employer] (which disclaims liability or warranty for this information). This care instruction is for use with your licensed healthcare professional. If you have questions about a medical condition or this instruction, always ask your healthcare professional. Healthwise, Incorporated disclaims any warranty or liability for your use of this information.  Content Version: 10.1.311062; Current as of: June 11, 2012

## 2013-01-07 NOTE — Progress Notes (Signed)
[facility]: Progress Note  Shelbie Proctor, MD  01/07/2013   Chief Complaint   Patient presents with   ??? Hospital Follow Up     migraine       HPI:  Jody [name] is a 42 y.o. female who presents to clinic today ED follow-up.  Notes she was seen in the ED at [facility] on 10/3 with complaints of  Headache.  Records reviewed and are summarized as follows.  Found to have a sinus infection and discharged on doxycycline and nasonex.  CTA head done in ED due to hx of uterine cancer and was negative for masses or bleeds.    She has yet to fill this medications because of cost.   Notes she continues to have a headache, located behind her eyes.  Has some eye discharge, tooth/mouth pain and neck tenderness.  She has had chills at home.  Continues to have a productive cough and waves of pain in her back.    Past Medical History   Diagnosis Date   ??? Endometrial thickening on ultra sound    ??? Diabetic neuropathy      Per pt diagnosed by a physician a [facility]   ??? Lumbar disc disease    ??? 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 Asthma    ??? GERD (gastroesophageal reflux disease)    ??? Diabetes    ??? Thyroid disease    ??? Psychiatric disorder      depression, anxiety     Allergies and Medications reviewed and updated.    Review of Systems   Constitutional: Positive for fever and chills.   HENT: Positive for ear pain, congestion and sore throat.    Eyes: Positive for discharge.   Respiratory: Positive for cough and sputum production. Negative for hemoptysis, shortness of breath and wheezing.    Cardiovascular: Negative for chest pain.   Gastrointestinal: Negative for nausea, abdominal pain, diarrhea, constipation, blood in stool and melena.   Genitourinary: Negative for dysuria and urgency.   Musculoskeletal: Positive for myalgias.   Skin: Negative for rash.   Neurological: Positive for headaches.   All other systems reviewed and are negative.      Vitals:  Filed Vitals:    01/07/13 1454   BP: 121/75    Pulse: 81   Temp: 97.8 ??F (36.6 ??C)   TempSrc: Oral   Resp: 20   Height: 5\' 6"  (1.676 m)   Weight: 249 lb 6.4 oz (113.127 kg)   SpO2: 96%   PainSc:   0 - No pain   LMP: 08/15/2011      Body mass index is 40.27 kg/(m^2).    Physical Exam   Nursing note and vitals reviewed.  Constitutional: She appears well-developed and well-nourished. No distress.   HENT:   Head: Normocephalic.   Right Ear: Tympanic membrane, external ear and ear canal normal.   Left Ear: External ear and ear canal normal. Tympanic membrane is not injected, not erythematous, not retracted and not bulging. A middle ear effusion is present.   Nose: Mucosal edema and rhinorrhea present. Right sinus exhibits maxillary sinus tenderness and frontal sinus tenderness. Left sinus exhibits maxillary sinus tenderness and frontal sinus tenderness.   Mouth/Throat: Mucous membranes are normal. Posterior oropharyngeal erythema present. No oropharyngeal exudate or posterior oropharyngeal edema.   Eyes: Conjunctivae are normal. Pupils are equal, round, and reactive to light. Right eye exhibits no discharge. Left eye exhibits no discharge.  No scleral icterus.   Neck: Normal range of motion. Muscular tenderness present. No Brudzinski's sign and no Kernig's sign noted. No thyromegaly present.   Cardiovascular: Normal rate, regular rhythm, normal heart sounds and intact distal pulses.  Exam reveals no gallop and no friction rub.    No murmur heard.  Pulmonary/Chest: Effort normal and breath sounds normal. No respiratory distress. She has no wheezes. She has no rales. She exhibits no tenderness.   Abdominal: Soft. Bowel sounds are normal. There is no tenderness.   Skin: She is not diaphoretic.       Assessment and Plan:  1. Sinusitis  Assisted patient in obtaining a coupon for doxycycline.  Patient with significant drug allergies, limiting available options for treatment.  Discussed nasal irrigation, nasal corticosteroids (OTC Nasonex), vaporizer for symptoms.    2.  Headache  Likely 2/2 sinusitis.  Patient taking 3 500mg  tylenol 4x per day.  Discussed with patient that this is too much and can cause liver damage.  May apply warm compresses to sinuses, use steam and other means to alleviate discomfort.  She should take no more than 1000mg  tylenol q6 hours.      I have discussed the diagnosis with the patient and the intended plan as seen in the above orders.  The patient has received an after-visit summary and questions were answered concerning future plans.  I have discussed medication side effects and warnings with the patient as well.

## 2013-01-09 LAB — D-DIMER, QUANTITATIVE: D-Dimer, Quant: 0.27 mg/L FEU (ref 0.00–0.65)

## 2013-01-09 LAB — METABOLIC PANEL, COMPREHENSIVE
A-G Ratio: 1 — ABNORMAL LOW (ref 1.1–2.2)
ALT (SGPT): 26 U/L (ref 12–78)
AST (SGOT): 16 U/L (ref 15–37)
Albumin: 3.8 g/dL (ref 3.5–5.0)
Alk. phosphatase: 88 U/L (ref 45–117)
Anion gap: 10 mmol/L (ref 5–15)
BUN/Creatinine ratio: 12 (ref 12–20)
BUN: 9 MG/DL (ref 6–20)
Bilirubin, total: 0.3 MG/DL (ref 0.2–1.0)
CO2: 29 mmol/L (ref 21–32)
Calcium: 9.3 MG/DL (ref 8.5–10.1)
Chloride: 102 mmol/L (ref 97–108)
Creatinine: 0.75 MG/DL (ref 0.45–1.15)
GFR est AA: >60 mL/min/{1.73_m2} (ref >60)
GFR est non-AA: >60 mL/min/{1.73_m2} (ref >60)
Globulin: 3.8 g/dL (ref 2.0–4.0)
Glucose: 229 mg/dL — ABNORMAL HIGH (ref 65–100)
Potassium: 4 mmol/L (ref 3.5–5.1)
Protein, total: 7.6 g/dL (ref 6.4–8.2)
Sodium: 141 mmol/L (ref 136–145)

## 2013-01-09 LAB — CBC WITH AUTOMATED DIFF
ABS. BASOPHILS: 0 10*3/uL (ref 0.0–0.1)
ABS. EOSINOPHILS: 0.3 10*3/uL (ref 0.0–0.4)
ABS. LYMPHOCYTES: 1.5 10*3/uL (ref 0.8–3.5)
ABS. MONOCYTES: 0.3 10*3/uL (ref 0.0–1.0)
ABS. NEUTROPHILS: 6.7 10*3/uL (ref 1.8–8.0)
BASOPHILS: 0 % (ref 0–1)
EOSINOPHILS: 4 % (ref 0–7)
HCT: 43.6 % (ref 35.0–47.0)
HGB: 15.5 g/dL (ref 11.5–16.0)
LYMPHOCYTES: 17 % (ref 12–49)
MCH: 30.9 PG (ref 26.0–34.0)
MCHC: 35.6 g/dL (ref 30.0–36.5)
MCV: 86.9 FL (ref 80.0–99.0)
MONOCYTES: 3 % — ABNORMAL LOW (ref 5–13)
NEUTROPHILS: 76 % — ABNORMAL HIGH (ref 32–75)
PLATELET: 227 10*3/uL (ref 150–400)
RBC: 5.02 M/uL (ref 3.80–5.20)
RDW: 13 % (ref 11.5–14.5)
WBC: 8.8 10*3/uL (ref 3.6–11.0)

## 2013-01-09 LAB — URINALYSIS W/ REFLEX CULTURE
Bilirubin: NEGATIVE
Blood: NEGATIVE
Glucose: 500 mg/dL — AB
Ketone: NEGATIVE mg/dL
Leukocyte Esterase: NEGATIVE
Nitrites: NEGATIVE
Protein: NEGATIVE mg/dL
Specific gravity: 1.015 (ref 1.003–1.030)
Urobilinogen: 0.2 EU/dL (ref 0.2–1.0)
pH (UA): 6 (ref 5.0–8.0)

## 2013-01-09 LAB — DRUG SCREEN, URINE
AMPHETAMINES: NEGATIVE
BARBITURATES: NEGATIVE
BENZODIAZEPINES: POSITIVE — AB
COCAINE: NEGATIVE
METHADONE: NEGATIVE
OPIATES: NEGATIVE
PCP(PHENCYCLIDINE): NEGATIVE
THC (TH-CANNABINOL): NEGATIVE

## 2013-01-09 LAB — D DIMER: D-dimer: 0.27 mg/L FEU (ref 0.00–0.65)

## 2013-01-09 LAB — CK W/ CKMB & INDEX
CK - MB: <0.5 NG/ML — ABNORMAL LOW (ref 0.5–3.6)
CK: 39 U/L (ref 26–192)

## 2013-01-09 LAB — TROPONIN I: Troponin-I, Qt.: <0.04 ng/mL (ref <0.05)

## 2013-01-09 LAB — MAGNESIUM: Magnesium: 1.7 mg/dL (ref 1.6–2.4)

## 2013-01-09 MED ADMIN — diazepam (VALIUM) injection 5 mg: INTRAVENOUS | @ 20:00:00 | NDC 00409127332

## 2013-01-09 MED ADMIN — sodium chloride 0.9 % bolus infusion 1,000 mL: INTRAVENOUS | @ 20:00:00 | NDC 00409798309

## 2013-01-09 NOTE — ED Notes (Signed)
Pt verbalizes understanding of discharge instructions. Ambulatory to lobby with family upon discharge. NAD.

## 2013-01-09 NOTE — ED Provider Notes (Signed)
Patient is a 42 y.o. female presenting with chest pain.   Chest Pain (Angina)   Associated symptoms include back pain, cough, headaches, nausea, palpitations and vomiting. Pertinent negatives include no abdominal pain, no fever, no numbness, no shortness of breath and no weakness.   42 yo WF presents with palpitations onset this afternoon while walking around in her house at 1:30pm today.  Went to lay down and then had shaking in right leg.  Last night began with right lower back pain, hx of similar symptoms with disk disease.  Denies bowel/bladder incontinence.  C/o chest tightness, substernal, 10/10, nonradiating.  C/o left lower leg pain onset at same time.  Denies swelling in legs.  Denies sob.  +cough/congestion-productive of yellow/brown thick mucus.  Denies fever, chills.  Seen in ED Oct. 3, dx with sinusitis but were too expensive so did not get them filled.  Followed up with pcp and was told to get a neti pot.       Past Medical History   Diagnosis Date   ??? Endometrial thickening on ultra sound    ??? Diabetic neuropathy      Per pt diagnosed by a physician a [facility]   ??? Lumbar disc disease    ??? 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 Asthma    ??? GERD (gastroesophageal reflux disease)    ??? Diabetes    ??? Thyroid disease    ??? Psychiatric disorder      depression, anxiety        Past Surgical History   Procedure Laterality Date   ??? Hx orthopaedic       back   ??? Hx appendectomy     ??? Pr hysteroscopy,w/endo bx N/A 02/06/2012     OPERATIVE HYSTEROSCOPY, RESECTION SUBMUCOSSAL FIBROIDS performed by Eyvonne Mechanic, MD at [facility] MAIN OR   ??? Hx tubal ligation  1994   ??? Hx gyn  03/05/12     TLH/BSO/Lymphadenectomy         Family History   Problem Relation Age of Onset   ??? Seizures Mother    ??? Seizures Father    ??? Diabetes Mother    ??? Asthma Mother    ??? High Cholesterol Mother    ??? Heart Attack Mother    ??? Hypertension Father    ??? Cancer Father      throat cancer   ??? Heart Attack Father    ??? Hypertension Sister    ???  Hypertension Paternal Grandmother    ??? Diabetes Paternal Grandmother    ??? Breast Cancer Paternal Grandmother    ??? Cancer Paternal Grandfather      leukemia        History     Social History   ??? Marital Status: MARRIED     Spouse Name: N/A     Number of Children: N/A   ??? Years of Education: N/A     Occupational History   ??? Not on file.     Social History Main Topics   ??? Smoking status: Current Every Day Smoker -- 0.50 packs/day for 10 years     Types: Cigarettes     Last Attempt to Quit: 07/11/2010   ??? Smokeless tobacco: Never Used   ??? Alcohol Use: No   ??? Drug Use: No   ??? Sexually Active: Yes -- Female partner(s)     Birth Control/ Protection: Surgical      Comment: BTL 19 years ago  Other Topics Concern   ??? Not on file     Social History Narrative   ??? No narrative on file                  ALLERGIES: Erythromycin; Levaquin; Macrolide antibiotics; Nsaids (non-steroidal anti-inflammatory drug); Pcn; and Ultram      Review of Systems   Constitutional: Negative for fever and chills.   HENT: Positive for congestion and rhinorrhea. Negative for ear pain, sore throat, drooling, mouth sores, trouble swallowing, neck pain and neck stiffness.    Respiratory: Positive for cough. Negative for shortness of breath.    Cardiovascular: Positive for chest pain and palpitations. Negative for leg swelling.   Gastrointestinal: Positive for nausea, vomiting and diarrhea. Negative for abdominal pain and constipation.   Genitourinary: Negative for dysuria and hematuria.   Musculoskeletal: Positive for back pain.   Skin: Negative for rash and wound.   Neurological: Positive for headaches. Negative for weakness and numbness.   Psychiatric/Behavioral: Negative.    All other systems reviewed and are negative.        Filed Vitals:    01/09/13 1544   BP: 142/82   Pulse: 75   Temp: 97.8 ??F (36.6 ??C)   Resp: 20   Height: 5\' 6"  (1.676 m)   Weight: 112.946 kg (249 lb)   SpO2: 97%            Physical Exam   Physical Examination: General appearance -  alert, disheveled, anxious, tearful, oriented to person, place, and time  Eyes - pupils equal and reactive, extraocular eye movements intact  HEENT-b/l TMs normal, pharynx normal  Neck - supple, no significant adenopathy  Chest - clear to auscultation, no wheezes, rales or rhonchi, symmetric air entry  Heart - normal rate, regular rhythm, normal S1, S2, no murmurs, rubs, clicks or gallops  Abdomen - soft, nontender, nondistended, no masses or organomegaly  Back exam - full range of motion, no tenderness, palpable spasm or pain on motion  Neurological - alert, oriented, normal speech, no focal findings or movement disorder noted  Musculoskeletal - no joint tenderness, deformity or swelling  Extremities - peripheral pulses normal, no pedal edema, no clubbing or cyanosis  Skin - normal coloration and turgor, no rashes, no suspicious skin lesions noted  MDM     Amount and/or Complexity of Data Reviewed:   Clinical lab tests:  Ordered and reviewed  Tests in the radiology section of CPT??:  Ordered and reviewed  Discussion of test results with the performing providers:  No   Decide to obtain previous medical records or to obtain history from someone other than the patient:  Yes   Obtain history from someone other than the patient:  No   Review and summarize past medical records:  Yes   Discuss the patient with another provider:  No   Independant visualization of image, tracing, or specimen:  Yes  Progress:   Patient progress:  Improved and stable      Procedures    EKG interpretation: (Preliminary)  Rhythm: normal sinus rhythm; and regular . Rate (approx.): 81; Axis: normal; P wave: normal; QRS interval: normal ; ST/T wave: non-specific changes; no changes from EKG 06/26/2012    Pt feeling much better.  Advised her to take doxycycline as prescribed (pt given coupon by PCP for meds).  Pt to f/u with pcp or return to ED for worsening symptoms.  Pt is afebrile, nontoxic, vital signs stable.

## 2013-01-09 NOTE — ED Notes (Addendum)
Pt reports chest pain that began around 1330 today. Also reports "shaking", lower abdominal pain, and back pain radiating down left leg. Pt also c/o left calf pain.

## 2013-01-10 NOTE — Progress Notes (Signed)
Recommend pt followup in the office in the next week.

## 2013-01-11 LAB — CULTURE, URINE
Colonies Counted: >100000
Colony Count: >100000

## 2013-01-11 LAB — EKG, 12 LEAD, INITIAL
Atrial Rate: 81 {beats}/min
Calculated P Axis: 31 degrees
Calculated R Axis: 51 degrees
Calculated T Axis: 53 degrees
Diagnosis: NORMAL
P-R Interval: 152 ms
Q-T Interval: 406 ms
QRS Duration: 82 ms
QTC Calculation (Bezet): 471 ms
Ventricular Rate: 81 {beats}/min

## 2013-01-12 NOTE — Progress Notes (Signed)
Patient listed on discharge [facility]) report on 01/09/13.  Patient discharged from [facility] for Chest Pain.  Contacted patient to perform post ED discharge assessment.  Verified DOB and address with patient as identifiers.  Provided introduction to self, and explanation of the NN role.  Performed medication reconciliation with patient, and patient verbalizes understanding of administration of home medications.  Reviewed discharge instructions with patient.  Patient verbalizes understand of discharge instructions and follow-up care. Patient scheduled to f/u with Dr. Robby Sermon on 02/16/13 at 2:05pm.  Reviewed red flags with patient, and patient verbalizes understanding. No other clinical/social/functional needs noted. Patient given an opportunity to ask questions.  Contact information provided to the patient for future reference or further questions.    Red Flags:    You have severe trouble breathing.  You have new or worse trouble breathing.  You cough up dark brown or bloody mucus (sputum).  You have a new or higher fever.  You have a new rash.  Your chest pain gets worse.   You are dizzy or lightheaded, or you feel like you may faint.   You are not getting better as expected.   You are having new or different chest pain.

## 2013-01-12 NOTE — Telephone Encounter (Signed)
Patient called to say the script of xanax needs to have the date changed on it.  She really needs her anxiety medication today.  Please call and advise.    Said she will bring to the office the other script dated 05/27/12 as this date is old and pharmacy cannot accept.  She said she had spoken with another nurse today and was not advised at that time the physician is not in the office.

## 2013-01-13 NOTE — Progress Notes (Signed)
[facility]  [address]  [place], [place] [address]  [phone] FAX [phone]  Kathe Mariner, MD   Annia Friendly, MD  Lavella Lemons, [place]  Office Visit    Patient ID:  Name: Jody [name]  MRN: [id number]  DOB: Dec 07, 1970/42 y.o.  Visit date: 01/13/2013    Primary Diagnosis:     ICD-9-CM    1. Malignant neoplasm of corpus uteri, except isthmus Endometrioid Adenocarcinoma  182.0 PAP, LB, RFX HPV [id number])        Problem List:    Patient Active Problem List   Diagnosis Code   ??? Depression 311   ??? Hyperlipidemia 272.4   ??? Diabetes mellitus 250.00   ??? GERD (gastroesophageal reflux disease) 530.81   ??? 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 Obesity, Class III, BMI 40-49.9 (morbid obesity) 278.01       INTERVAL HISTORY: Jody [name] is a Caucasian female with a history of stage IA, grade 1 endometrial carcinoma.  She underwent a TLH, BSO in December 2013.  She was not recommended any additional therapy.  She presents today for her routine surveillance.  She is doing well and is without complaints.   She denies any vaginal bleeding or discharge, any pelvic or abdominal pain, or any changes in her bowel or bladder habits.  She does report some recent hematuria.      Negative GU and GI review.  Negative cardiopulmonary review. No SOB, palpitations. No syncope  Negative musculoskeletal review.   Negative Psychological/Neurological review.    Patient denies any abnormal bleeding or vaginal discharge. Weight stable.      PMH:  Past Medical History   Diagnosis Date   ??? Endometrial thickening on ultra sound    ??? Diabetic neuropathy      Per pt diagnosed by a physician a [facility]   ??? Lumbar disc disease    ??? 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 Asthma    ??? GERD (gastroesophageal reflux disease)    ??? Diabetes    ??? Thyroid disease    ??? Psychiatric disorder      depression, anxiety     PSH:  Past Surgical History   Procedure Laterality Date    ??? Hx orthopaedic       back   ??? Hx appendectomy     ??? Pr hysteroscopy,w/endo bx N/A 02/06/2012     OPERATIVE HYSTEROSCOPY, RESECTION SUBMUCOSSAL FIBROIDS performed by Eyvonne Mechanic, MD at [facility] MAIN OR   ??? Hx tubal ligation  1994   ??? Hx gyn  03/05/12     TLH/BSO/Lymphadenectomy     SOC:  History     Social History   ??? Marital Status: MARRIED     Spouse Name: N/A     Number of Children: N/A   ??? Years of Education: N/A     Occupational History   ??? Not on file.     Social History Main Topics   ??? Smoking status: Current Every Day Smoker -- 0.50 packs/day for 10 years     Types: Cigarettes     Last Attempt to Quit: 07/11/2010   ??? Smokeless tobacco: Never Used   ??? Alcohol Use: No   ??? Drug Use: No   ??? Sexually Active: Yes -- Female partner(s)     Birth Control/ Protection: Surgical      Comment: BTL 19  years ago     Other Topics Concern   ??? Not on file     Social History Narrative   ??? No narrative on file     Family History  Family History   Problem Relation Age of Onset   ??? Seizures Mother    ??? Seizures Father    ??? Diabetes Mother    ??? Asthma Mother    ??? High Cholesterol Mother    ??? Heart Attack Mother    ??? Hypertension Father    ??? Cancer Father      throat cancer   ??? Heart Attack Father    ??? Hypertension Sister    ??? Hypertension Paternal Grandmother    ??? Diabetes Paternal Grandmother    ??? Breast Cancer Paternal Grandmother    ??? Cancer Paternal Grandfather      leukemia     Medications: (reviewed)  Current Outpatient Prescriptions on File Prior to Visit   Medication Sig Dispense Refill   ??? clonazepam (KLONOPIN) 0.5 mg tablet Take 1 tablet by mouth two (2) times a day. As needed  60 tablet  0   ??? metformin (GLUCOPHAGE) 1,000 mg tablet Take 1 tablet by mouth two (2) times daily (with meals).  60 tablet  1   ??? glipizide (GLUCOTROL) 5 mg tablet Take 2 tabs by mouth in am, one tab in pm  90 tablet  1   ??? sertraline (ZOLOFT) 100 mg tablet Take 2 tablets by mouth daily.  60 tablet  0   ??? fluticasone-salmeterol (ADVAIR) 250-50  mcg/dose diskus inhaler Take 1 Puff by inhalation every twelve (12) hours.  1 Inhaler  2   ??? albuterol (PROVENTIL VENTOLIN) 2.5 mg /3 mL (0.083 %) nebulizer solution 3 mL by Nebulization route every four (4) hours as needed for Wheezing.  1 Package  2   ??? acetaminophen (TYLENOL) 325 mg tablet Take  by mouth every four (4) hours as needed for Pain.       ??? esomeprazole (NEXIUM) 20 mg capsule Take 1 Cap by mouth daily.  30 Cap  6   ??? [EXPIRED] doxycycline (VIBRA-TABS) 100 mg tablet Take 1 tablet by mouth two (2) times a day for 10 days.  20 tablet  0     No current facility-administered medications on file prior to visit.     Allergies: (reviewed)  Allergies   Allergen Reactions   ??? Erythromycin Nausea and Vomiting and Swelling   ??? Levaquin [Levofloxacin] Rash and Swelling   ??? Macrolide Antibiotics Rash, Nausea and Vomiting and Swelling   ??? Nsaids (Non-Steroidal Anti-Inflammatory Drug) Other (comments)     Per pt, was told by GI d/t stomach ulcers   ??? Pcn [Penicillins] Nausea and Vomiting and Swelling   ??? Ultram [Tramadol] Nausea Only       ROS:  Negative     OBJECTIVE:    PHYSICAL EXAM  VITAL SIGNS: Filed Vitals:    01/13/13 1419   BP: 153/79   Pulse: 90   Height: 5\' 6"  (1.676 m)   Weight: 251 lb 6.4 oz (114.034 kg)      GENERAL APP: Conversant, alert, oriented. NAD   HEENT: Normocephalic. Oropharynx clear.   Neck: Trachea midline, no JVD, no thyroid enlargement   RESPIRATORY: Lung fields clear to auscultation and percussion. Adequate respiratory effort   CARDIOVASC: RRR without murmur   GASTROINT: soft, non-tender, without masses or organomegaly. No hernia noted   MUSCULOSKEL: FROM. No focal tenderness.   EXTREMITIES:  extremities normal, atraumatic, no cyanosis or edema. Pulses appreciated.   PELVIC: External genitalia: normal general appearance  Urinary system: urethral meatus normal  Vaginal: normal without tenderness, induration or masses  Cervix: absent  Adnexa: removed surgically  Uterus: absent   RECTAL:  Deferred   NODAL SURVEY: Negative throughout---neck, axillae, inguina.   NEURO: Grossly intact, no acute deficit. Oriented. Not depressed. Not agitated.       CT of abdomen/pelvis (08/21/12)  The visualized portions of the lung bases are clear.   There is hepatic steatosis the study is compromised by patient's obesity.   The absence of intravenous contrast material reduces the sensitivity for   evaluation of the solid parenchymal organs of the abdomen. No focal   abnormalities are seen within the liver, spleen, pancreas or adrenal glands   or kidneys. No renal or ureteral calculus or obstruction is identified.   The aorta tapers without aneurysm. There is no retroperitoneal adenopathy   or mass. The bowel is not obstructed. The appendix is surgically absent. There is no   ascites or free intraperitoneal air.  There is no pelvic mass or adenopathy.     IMPRESSION: No acute process identified      IMPRESSION AND PLAN:  Jody [name] has a working diagnosis of stage IA, grade 1 endometrial carcinoma.  She has no evidence of disease on today's exam.  We will follow up on today's pap and I will see her back in 3 months for continued surveillance.  I will refer her to urology for evaluation of her hematuria.      The patient has been advised to annual mammograph and screening colonoscopy.  The patient is advised to call with any problems, our availability is expressed.    Domenica Fail, MD  01/13/2013/8:50 AM

## 2013-01-13 NOTE — Telephone Encounter (Signed)
Chief Complaint   Patient presents with   ??? Medication Evaluation     old script of xanax dated Feb 2014     Patient wasn't in left message to call [facility]  Per Dr.Hendricks appointment required for refill

## 2013-01-13 NOTE — Telephone Encounter (Signed)
Per Dr.Hendricks patient needs to be seen   Will call the patient when  i get a chance

## 2013-01-18 NOTE — Progress Notes (Signed)
Quick Note:    Normal pap smear.  ______

## 2013-02-06 LAB — AMB POC URINALYSIS DIP STICK AUTO W/O MICRO
Blood (UA POC): NEGATIVE
Glucose (UA POC): NEGATIVE
Nitrites (UA POC): NEGATIVE
Specific gravity (UA POC): 1.03 (ref 1.001–1.035)
Urobilinogen (UA POC): 1 (ref 0.2–1)
pH (UA POC): 5.5 (ref 4.6–8.0)

## 2013-02-06 LAB — AMB POC HEMOGLOBIN A1C: Hemoglobin A1c (POC): 5.7 %

## 2013-02-06 NOTE — Progress Notes (Signed)
Told Dr. Sheppard Plumber that she had pain -- felt that problem was her bladder  Sent to urology and sounds like she had cystoscopy done -- since then she has had urinary incontinence per patient's history  Pt states that bladder exam was 01/24/13    Pt continues to be very anxious  Did not have her clonazepam refilled and thus has been out of medication for over a month after having taken it at least twice daily for the last few months  Advised pt that she must not stop this medication abruptly, that it should be tapered    PT ADVISED THAT SHE COULD ONLY GET HER CLONAZEPAM RX FROM ME AS HER PCP -- IF SHE GETS A RX SOMEWHERE ELSE OR FROM SOMEONE ELSE, SHE WILL BE DISCHARGED FROM THE PRACTICE - PT REPORTS THAT SHE UNDERSTANDS.    Will obtain urine specimen today - if there is hematuria, will refer back to urology -- negative for blood today.    Rx clonazepam given -- pt to follow up monthly.    Her HgbA1C in acceptable range -- on sulfonyurea and metformin -- pt reports compliance and with attending educational [facility].    Greater than 50% of this 20 minute visit was counseling about diabetes and anxiety.

## 2013-02-10 NOTE — Telephone Encounter (Signed)
Patient states she's calling to speak with nurse Vonzell Schlatter.) about urine culture.

## 2013-02-13 NOTE — Telephone Encounter (Signed)
Patient returned nurse call and message relayed.  Said she saw Dr Doran Clay, urologist, yesterday on Nov 13 and said she had a severe bacterial infection after review of urine test and results at that office.  Given bactrim medication at that visit.    She does not need to come here.

## 2013-02-13 NOTE — Telephone Encounter (Signed)
TC attempted to pt's home and cell, LM on VM for pt to call back.  The order for her urine culture was placed after the lab tech's had left for the day and her culture was not collected.  Pt will need to come in to leave a sample to send off for culture.  Pt to be advised of this when she calls back.

## 2013-03-16 ENCOUNTER — Encounter

## 2013-03-16 NOTE — Telephone Encounter (Signed)
Med arefill!      Thanks,

## 2013-03-18 ENCOUNTER — Encounter

## 2013-03-18 NOTE — Telephone Encounter (Signed)
Pt needs a refill of the following  Requested Prescriptions     Pending Prescriptions Disp Refills   ??? clonazePAM (KLONOPIN) 0.5 mg tablet 60 tablet 0     Sig: Take 1 tablet by mouth two (2) times a day. As needed   ??? sertraline (ZOLOFT) 100 mg tablet 60 tablet 0     Sig: Take 2 tablets by mouth daily.   ??? metFORMIN (GLUCOPHAGE) 1,000 mg tablet 60 tablet 1     Sig: Take 1 tablet by mouth two (2) times daily (with meals).   ??? glipiZIDE (GLUCOTROL) 5 mg tablet 90 tablet 1     Sig: Take 2 tabs by mouth in am, one tab in pm     Pt states she ran out yesterday and wanted to make an apt w/Dr. Robby Sermon. I informed pt she is out of the office this week

## 2013-03-20 ENCOUNTER — Encounter

## 2013-03-20 LAB — GLUCOSE, POC: Glucose (POC): 144 mg/dL — ABNORMAL HIGH (ref 65–105)

## 2013-03-20 MED ORDER — HYDROCODONE-ACETAMINOPHEN 5 MG-325 MG TAB
5-325 mg | ORAL | Status: AC
Start: 2013-03-20 — End: 2013-03-20
  Administered 2013-03-20: 19:00:00 via ORAL

## 2013-03-20 MED ORDER — ONDANSETRON 4 MG TAB, RAPID DISSOLVE
4 mg | ORAL | Status: AC
Start: 2013-03-20 — End: 2013-03-20
  Administered 2013-03-20: 19:00:00 via ORAL

## 2013-03-20 MED ADMIN — ibuprofen (MOTRIN) tablet 600 mg: ORAL | @ 19:00:00 | NDC 53746046505

## 2013-03-20 NOTE — ED Notes (Signed)
Fillinger, PA has reviewed discharge instructions with the patient.  The patient verbalized understanding. Pt ambulatory out of the ED after discharge with her spouse.

## 2013-03-20 NOTE — ED Notes (Signed)
Pt presents to the treatment room via w/c for evaluation of lower back pain for several days and sent by PCP for MRI. Pt with prior history of back surgery.

## 2013-03-20 NOTE — ED Notes (Signed)
Disposition set; pt prepared for discharge.

## 2013-03-20 NOTE — ED Notes (Signed)
Pt returned from MRI and rpts no relief of pain since being medicated.  Pt resting in a position of comfort and tearful.  V/S reassessed. Family at bedside Call placed to PCP by [facility], [facility]; will await further care mgmt.

## 2013-03-20 NOTE — ED Notes (Signed)
MRI screening tool completed and placed at bedside.  Pt repositioned on the stretcher for comfort. Family at bedside.  Awaiting ER MD evaluation.

## 2013-03-20 NOTE — Progress Notes (Signed)
Subjective:      Patient : This patient is a 42 y.o. female with a chief complaint of back pain. Paint is acute on chronic. Hx of lumbar discectomy per patient, several years ago. New complaint of severe pain over lumbar and paraspinal, radiating to flank/hip not legs. Pt had episode of bowel incontinence after coughing 2 weeks ago. Has not recurred. No current bowel or bladder incontinence. No fever, night sweats, or weight changes. There is saddle anesthesia.    Review of Systems:  General/Constitutional:  No fever, chills, sweats, fatigue, night sweats, weakness, weight loss or weight gain   Head: No headache, no trauma   Neck: No swelling, masses, stiffness, pain, or limited movement   Cardiac: No chest pain   Respiratory: No cough, shortness of breath, or dyspnea on exertion   GI: No incontinence. No nausea/vomiting, diarrhea, abdominal pain, bloody or dark stools  GU: No incontinence. No change in urinary habits.  Peripheral Vascular: No edema, coldness, numbness, discoloration, pain, or paresthesias   Musculoskeletal: As per HPI  Neurological: No saddle distribution paresthesia. No siatic pain. No loss of consciousness, dizziness, seizures, dysarthria, cognitive changes, problems with balance, or unilateral weakness.    Past Medical History   Diagnosis Date   ??? Endometrial thickening on ultra sound    ??? Diabetic neuropathy      Per pt diagnosed by a physician a [facility]   ??? Lumbar disc disease    ??? 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 Asthma    ??? GERD (gastroesophageal reflux disease)    ??? Diabetes    ??? Thyroid disease    ??? Psychiatric disorder      depression, anxiety     Family History   Problem Relation Age of Onset   ??? Seizures Mother    ??? Seizures Father    ??? Diabetes Mother    ??? Asthma Mother    ??? High Cholesterol Mother    ??? Heart Attack Mother    ??? Hypertension Father    ??? Cancer Father      throat cancer   ??? Heart Attack Father    ??? Hypertension Sister    ??? Hypertension Paternal Grandmother     ??? Diabetes Paternal Grandmother    ??? Breast Cancer Paternal Grandmother    ??? Cancer Paternal Grandfather      leukemia     Current Outpatient Prescriptions   Medication Sig Dispense Refill   ??? clonazePAM (KLONOPIN) 0.5 mg tablet Take 1 tablet by mouth two (2) times a day. As needed  60 tablet  0   ??? glucose blood VI test strips (ASCENSIA AUTODISC VI, ONE TOUCH ULTRA TEST VI) strip To be used for testing of blood sugar via fingerstick glucometer  1 Package  11   ??? metformin (GLUCOPHAGE) 1,000 mg tablet Take 1 tablet by mouth two (2) times daily (with meals).  60 tablet  1   ??? glipizide (GLUCOTROL) 5 mg tablet Take 2 tabs by mouth in am, one tab in pm  90 tablet  1   ??? sertraline (ZOLOFT) 100 mg tablet Take 2 tablets by mouth daily.  60 tablet  0   ??? albuterol (PROVENTIL VENTOLIN) 2.5 mg /3 mL (0.083 %) nebulizer solution 3 mL by Nebulization route every four (4) hours as needed for Wheezing.  1 Package  2   ??? acetaminophen (TYLENOL) 325 mg tablet Take  by mouth every four (4) hours as needed  for Pain.       ??? esomeprazole (NEXIUM) 20 mg capsule Take 1 Cap by mouth daily.  30 Cap  6   ??? fluticasone-salmeterol (ADVAIR) 250-50 mcg/dose diskus inhaler Take 1 Puff by inhalation every twelve (12) hours.  1 Inhaler  2     Allergies   Allergen Reactions   ??? Erythromycin Nausea and Vomiting and Swelling   ??? Levaquin [Levofloxacin] Rash and Swelling   ??? Macrolide Antibiotics Rash, Nausea and Vomiting and Swelling   ??? Nsaids (Non-Steroidal Anti-Inflammatory Drug) Other (comments)     Per pt, was told by GI d/t stomach ulcers   ??? Pcn [Penicillins] Nausea and Vomiting and Swelling   ??? Ultram [Tramadol] Nausea Only     History     Social History   ??? Marital Status: MARRIED     Spouse Name: N/A     Number of Children: N/A   ??? Years of Education: N/A     Occupational History   ??? Not on file.     Social History Main Topics   ??? Smoking status: Current Every Day Smoker -- 0.50 packs/day for 10 years     Types: Cigarettes     Last  Attempt to Quit: 07/11/2010   ??? Smokeless tobacco: Never Used   ??? Alcohol Use: No   ??? Drug Use: No   ??? Sexually Active: Yes -- Female partner(s)     Birth Control/ Protection: Surgical      Comment: BTL 19 years ago     Other Topics Concern   ??? Not on file     Social History Narrative   ??? No narrative on file       Objective:     BP 140/82   Pulse 81   Temp(Src) 98.4 ??F (36.9 ??C) (Oral)   Resp 22   Ht 5\' 6"  (1.676 m)   Wt 244 lb (110.678 kg)   BMI 39.4 kg/m2   SpO2 96%   LMP 08/15/2011    General: Alert and oriented and in no acute distress. Responds to all questions appropriately. Obese  LUNGS: Respirations unlabored.  Skin: No obvious rash.  MSK:    Posture: Normal   Deformity: None    ROM:     Flexion: Normal    Extension: Normal     Lateral bending: Normal      Gait: Antalgic      Palpation:    L1-L5: No tenderness    Sacrum: No tenderness    Coccyx: No tenderness    Left Paraspinal: Tenderness    Right Paraspinal: Ttenderness     Strength (0-5/5)    Hip Flexion:   Left: 4/5  Right: 4/5    Hip Extension:  Left: 4/5  Right: 4/5    Hip Abduction:  Left: 4/5  Right: 4/5    Hip Adduction:  Left: 4/5  Right: 4/5    Knee Extension:  Left: 4/5  Right: 4/5    Knee Flexion:   Left: 4/5  Right: 4/5    Ankle dorsiflexion:  Left: 5/5  Right: 5/5    Ankle plantarflexion:  Left: 5/5  Right: 5/5    Great toe extension:  Left: 5/5  Right: 5/5     Sensation: Decreased sensation to vibration over bilateral 1st Tarsal-metatarsal joints. There is decreased sensation to light touch over saddle region bilaterally. Left>Right deficit     DTR:    Patella:  Left: +2  Right: +2    Achilles:  Left: +2  Right: +2     Special test:    Straight leg: Left: Negative  Right: Negative      Imaging - Personally reviewed radiographs of the lumbar spine in July 2014 reveal no listhesis, fracture or arthritic changes . No pars defect. There is minimal narrowing of L4-5 disk height.       Assessment/Plan:       ICD-9-CM    1. Saddle anesthesia 782.0  MRI LUMB SPINE WO CONT   2. Low back pain 724.2 MRI LUMB SPINE WO CONT     Patient with acute on chronic back pain but now has saddle anesthesia. Likely acute on chronic pain but with 1 episode of stress incontinence and PE findings need to r/o Cauda equina. Stat MRI unable to be obtained as outpatient. Patient driven by husband to ER. Spoke with ER attending, MRI to r/o cauda equina. If negative f/u as outpatient.    Patient states reaction to nsaid and tramadol - nausea.

## 2013-03-20 NOTE — ED Provider Notes (Addendum)
HPI Comments: 42 y/o F DMII, chronic LBP presents with acute worsening onset Wed, she notes sneezing with subsequent pain in buttock, referred to inner thigh, evaluated by pcp today, describing stress incontinence with associated tingling inner thigh L > R, no urinary/fecal retention, no abd pain.  Has taken advil and tylenol without relief.  No recent surgery, no fever/recent epidural/saddle paresthesia/change in b/b habits or IVDU.  No urinary sx.  No LE weakness.        Patient is a 42 y.o. female presenting with back pain.   Back Pain   Pertinent negatives include no fever.        Past Medical History   Diagnosis Date   ??? Endometrial thickening on ultra sound    ??? Diabetic neuropathy      Per pt diagnosed by a physician a [facility]   ??? Lumbar disc disease    ??? 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 Asthma    ??? GERD (gastroesophageal reflux disease)    ??? Diabetes    ??? Thyroid disease    ??? Psychiatric disorder      depression, anxiety        Past Surgical History   Procedure Laterality Date   ??? Hx orthopaedic       back   ??? Hx appendectomy     ??? Pr hysteroscopy,w/endo bx N/A 02/06/2012     OPERATIVE HYSTEROSCOPY, RESECTION SUBMUCOSSAL FIBROIDS performed by Eyvonne Mechanic, MD at [facility] MAIN OR   ??? Hx tubal ligation  1994   ??? Hx gyn  03/05/12     TLH/BSO/Lymphadenectomy         Family History   Problem Relation Age of Onset   ??? Seizures Mother    ??? Seizures Father    ??? Diabetes Mother    ??? Asthma Mother    ??? High Cholesterol Mother    ??? Heart Attack Mother    ??? Hypertension Father    ??? Cancer Father      throat cancer   ??? Heart Attack Father    ??? Hypertension Sister    ??? Hypertension Paternal Grandmother    ??? Diabetes Paternal Grandmother    ??? Breast Cancer Paternal Grandmother    ??? Cancer Paternal Grandfather      leukemia        History     Social History   ??? Marital Status: MARRIED     Spouse Name: N/A     Number of Children: N/A   ??? Years of Education: N/A     Occupational History   ??? Not on file.     Social  History Main Topics   ??? Smoking status: Current Every Day Smoker -- 0.50 packs/day for 10 years     Types: Cigarettes     Last Attempt to Quit: 07/11/2010   ??? Smokeless tobacco: Never Used   ??? Alcohol Use: No   ??? Drug Use: No   ??? Sexually Active: Yes -- Female partner(s)     Birth Control/ Protection: Surgical      Comment: BTL 19 years ago     Other Topics Concern   ??? Not on file     Social History Narrative   ??? No narrative on file                  ALLERGIES: Erythromycin; Levaquin; Macrolide antibiotics; Nsaids (non-steroidal anti-inflammatory drug); Pcn; and Ultram  Review of Systems   Constitutional: Negative for fever and chills.   Genitourinary: Negative.    Musculoskeletal: Positive for back pain.   All other systems reviewed and are negative.        Filed Vitals:    03/20/13 1318   BP: 209/74   Pulse: 80   Temp: 98 ??F (36.7 ??C)   Resp: 18   Height: 5\' 6"  (1.676 m)   Weight: 110.678 kg (244 lb)   SpO2: 96%            Physical Exam   Nursing note and vitals reviewed.  Constitutional: She is oriented to person, place, and time. She appears well-developed and well-nourished.   HENT:   Head: Normocephalic and atraumatic.   Neck: Normal range of motion. Neck supple.   Cardiovascular: Normal rate, regular rhythm, normal heart sounds and intact distal pulses.    Pulmonary/Chest: Effort normal and breath sounds normal.   Abdominal: Soft. Bowel sounds are normal.   Musculoskeletal: Normal range of motion.   Neurological: She is alert and oriented to person, place, and time. She has normal strength. No sensory deficit. She exhibits normal muscle tone. She displays no Babinski's sign on the right side. She displays no Babinski's sign on the left side.   Reflex Scores:       Patellar reflexes are 2+ on the right side and 2+ on the left side.  Skin: Skin is warm and dry. No erythema.   Psychiatric: She has a normal mood and affect.        MDM     Differential Diagnosis; Clinical Impression; Plan:     42 y/o F referred  from pcp for MRI eval concern for cauda equina - MRI reviewed, discussed with referring physician - Marks - would like medrol dosepack and short course norco.        Procedures

## 2013-03-20 NOTE — Progress Notes (Signed)
1. Have you been to the ER, urgent care clinic since your last visit?  Hospitalized since your last visit?No  No  2. Have you seen or consulted any other health care providers outside of the [facility] since your last visit?  Include any pap smears or colon screening. No  No  Chief Complaint   Patient presents with   ??? Back Pain     lower and mid back.      Patient stated she took Aleve PM last night but it did not help with her pain.  No Xrays have been done.

## 2013-03-20 NOTE — ED Notes (Signed)
Pt updated on the treatment plan of care and medicated per orders.  Pt to MRI.

## 2013-03-22 NOTE — Telephone Encounter (Signed)
Noted that pt was seen in the ED since this request was made

## 2013-03-25 MED ORDER — CLONAZEPAM 0.5 MG TAB
0.5 mg | ORAL_TABLET | Freq: Two times a day (BID) | ORAL | Status: DC
Start: 2013-03-25 — End: 2013-05-29

## 2013-03-25 MED ORDER — GLIPIZIDE 5 MG TAB
5 mg | ORAL_TABLET | ORAL | Status: DC
Start: 2013-03-25 — End: 2013-05-29

## 2013-03-25 NOTE — Progress Notes (Signed)
I reviewed with the resident the medical history and the resident's findings on the physical examination.  I discussed with the resident the patient's diagnosis and concur with the plan.

## 2013-03-25 NOTE — Progress Notes (Signed)
Chief Complaint   Patient presents with   ??? Medication Refill     Reviewed record in preparation for visit and have obtained necessary documentation.

## 2013-03-25 NOTE — Progress Notes (Signed)
Continues to complain of back pain    Continues to complain of chest pain    Had back surgery in 2007  "two discs exploded"    Did an epi block and filled in where disc was herniated  States that he "messed up the nerves in her back"    Smoking 1/2 ppd   States that she knows that she needs to quit    Needs to go back to see Dr. Noland Fordyce -- he sent pt to the ED for MRI of lumbar spine which showed no stenosis, only degenerative changes    States that she thinks that the steroid helped some -- states that she is unable to take NSAIDs    Will refill medications, will have her followup with Dr. Noland Fordyce, repeat steroids for one additional week (for total of two weeks with adequate taper), and see patient back in one month    Pt aware that she needs to followup monthly

## 2013-03-27 NOTE — ED Provider Notes (Signed)
I was personally available for consultation in the emergency department.  I have reviewed the chart and agree with the documentation recorded by the MLP, including the assessment, treatment plan, and disposition.  Cathye Kreiter R Conal Shetley, MD

## 2013-04-06 NOTE — Telephone Encounter (Signed)
Pt called requesting to speak with nurse regarding her sinus infection and what she should take

## 2013-04-30 NOTE — Progress Notes (Signed)
NN called patient to inform her that the Nexium prescription has been re-ordered through [facility]. Patient was also informed that it will take 7-10 days to arrive at her address. Contact information provided to patient for use when needed.

## 2013-05-01 NOTE — Progress Notes (Signed)
Subjective:      Patient : This patient is a 43 y.o. female with a chief complaint of back pain. Pt with acute on chronic back pain. Hx of L4-5 partial laminectomy. Seen 1 month ago wit hcc of saddle anesthesia and urinary incontinence that has resolved. Sent to ED with MRI negative for causa equina. Has tried 2 rounds of prednisone which she thinks helped. Has tried hydrocodone, ultram and muscle relaxers with no effect. Denies current burning, numbness or tingling. Pain is mostly left>right paraspinal, non radiating. No current bowel or bladder incontinence. No fever, night sweats, or weight changes. Has tried PT previously for her knee.      Review of Systems:  General/Constitutional:  No fever, chills, sweats, fatigue, night sweats, weakness, weight loss or weight gain   Head: No headache, no trauma   Neck: No swelling, masses, stiffness, pain, or limited movement   Cardiac: No chest pain   Respiratory: No cough, shortness of breath, or dyspnea on exertion   GI: No incontinence. No nausea/vomiting, diarrhea, abdominal pain, bloody or dark stools  GU: No incontinence. No change in urinary habits.  Peripheral Vascular: No edema, coldness, numbness, discoloration, pain, or paresthesias   Musculoskeletal: As per HPI  Neurological: No saddle distribution paresthesia. No sciatic pain. No loss of consciousness, dizziness, seizures, dysarthria, cognitive changes, problems with balance, or unilateral weakness.    Past Medical History   Diagnosis Date   ??? Endometrial thickening on ultra sound    ??? Diabetic neuropathy      Per pt diagnosed by a physician a [facility]   ??? Lumbar disc disease    ??? 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 Asthma    ??? GERD (gastroesophageal reflux disease)    ??? Diabetes    ??? Thyroid disease    ??? Psychiatric disorder      depression, anxiety     Family History   Problem Relation Age of Onset   ??? Seizures Mother    ??? Seizures Father    ??? Diabetes Mother    ??? Asthma Mother    ??? High  Cholesterol Mother    ??? Heart Attack Mother    ??? Hypertension Father    ??? Cancer Father      throat cancer   ??? Heart Attack Father    ??? Hypertension Sister    ??? Hypertension Paternal Grandmother    ??? Diabetes Paternal Grandmother    ??? Breast Cancer Paternal Grandmother    ??? Cancer Paternal Grandfather      leukemia     Current Outpatient Prescriptions   Medication Sig Dispense Refill   ??? gabapentin (NEURONTIN) 300 mg capsule Take 1 capsule by mouth three (3) times daily for 30 days.  90 capsule  0   ??? metFORMIN (GLUCOPHAGE) 1,000 mg tablet Take 1 tablet by mouth two (2) times daily (with meals).  60 tablet  3   ??? glipiZIDE (GLUCOTROL) 5 mg tablet Take 2 tabs by mouth in am, one tab in pm  60 tablet  3   ??? sertraline (ZOLOFT) 100 mg tablet Take 2 tablets by mouth daily.  60 tablet  3   ??? clonazePAM (KLONOPIN) 0.5 mg tablet Take 1 tablet by mouth two (2) times a day. As needed  60 tablet  0   ??? albuterol (PROVENTIL VENTOLIN) 2.5 mg /3 mL (0.083 %) nebulizer solution 3 mL by Nebulization route every four (4) hours as needed for  Wheezing.  1 Package  2   ??? esomeprazole (NEXIUM) 20 mg capsule Take 1 Cap by mouth daily.  30 Cap  6     Allergies   Allergen Reactions   ??? Erythromycin Nausea and Vomiting and Swelling   ??? Levaquin [Levofloxacin] Rash and Swelling   ??? Macrolide Antibiotics Rash, Nausea and Vomiting and Swelling   ??? Nsaids (Non-Steroidal Anti-Inflammatory Drug) Other (comments)     Per pt, was told by GI d/t stomach ulcers   ??? Pcn [Penicillins] Nausea and Vomiting and Swelling   ??? Ultram [Tramadol] Nausea Only     History     Social History   ??? Marital Status: MARRIED     Spouse Name: N/A     Number of Children: N/A   ??? Years of Education: N/A     Occupational History   ??? Not on file.     Social History Main Topics   ??? Smoking status: Current Every Day Smoker -- 0.50 packs/day for 10 years     Types: Cigarettes     Last Attempt to Quit: 07/11/2010   ??? Smokeless tobacco: Never Used   ??? Alcohol Use: No   ??? Drug  Use: No   ??? Sexually Active: Yes -- Female partner(s)     Birth Control/ Protection: Surgical      Comment: BTL 19 years ago     Other Topics Concern   ??? Not on file     Social History Narrative   ??? No narrative on file       Objective:     BP 137/79   Pulse 77   Temp(Src) 98.1 ??F (36.7 ??C) (Oral)   Resp 16   Ht 5\' 6"  (1.676 m)   Wt 246 lb (111.585 kg)   BMI 39.72 kg/m2   SpO2 94%   LMP 08/15/2011    General: Alert and oriented and in no acute distress. Responds to all questions appropriately. Obese  LUNGS: Respirations unlabored.  Skin: No obvious rash.  MSK:    Posture: Normal   Deformity: None    ROM:     Flexion: Normal    Extension: Normal     Lateral bending: Normal      Gait: Antalgic      Palpation:    L1-L5: No tenderness    Sacrum: Mild TTP sacroiliac joint on left.    Coccyx: No tenderness    Left Paraspinal: Tenderness    Right Paraspinal: Tenderness     Strength (0-5/5)    Hip Flexion:   Left: 4/5  Right: 4/5    Hip Extension:  Left: 4/5  Right: 4/5    Hip Abduction:  Left: 4/5  Right: 4/5    Hip Adduction:  Left: 4/5  Right: 4/5    Knee Extension:  Left: 4/5  Right: 4/5       Sensation: Bilaterally symmetric, no saddle anesthesia, non-focal.       DTR:    Patella:  Left: +2  Right: +2     Special test:    Straight leg: Left: Negative  Right: Negative    FADIR, stinchfield and log roll negative. FABER - weakly positive.        Imaging - Personally reviewed radiographs of the lumbar spine in July 2014 reveal no listhesis, fracture or arthritic changes . No pars defect. There is minimal narrowing of L4-5 disk height. The MRI from 03/20/2013 with minimal disk protrusion L4-5. Facets and JS normal appearing  heights. No sign of nerve inflammation.       Assessment/Plan:       ICD-9-CM    1. Back pain 724.5 REFERRAL TO PHYSICAL THERAPY     gabapentin (NEURONTIN) 300 mg capsule     Patient with acute on chronic back pain. Likely myofascial in origin, less likely radicular or axial with essentially normal post  surgical MRI. Hypersensitivity to light tough on exam. Will try gabapentin and she is on zoloft and cannot afford to switch to cymbalta for dual effect. DC opoids and use sparingly if needed. NSAIDs and tramadol caused nausea in the past and pt states mscl relaxers ineffective.     Restart PT at [facility]. Difficult to assess if pure back or if there is some SI involvement as she has multiple positive findings.

## 2013-05-04 NOTE — Telephone Encounter (Signed)
Patient asking for the name of the stomach disease she was diagnosed with.

## 2013-05-07 NOTE — Progress Notes (Signed)
I reviewed the patient's medical history, the resident's findings on physical examination, the patient's diagnoses, and treatment plan as documented in the resident note.  I concur with the treatment plan as documented.  Additional suggestions noted.

## 2013-05-29 LAB — AMB POC URINE, MICROALBUMIN, SEMIQUANT (3 RESULTS)
ALBUMIN, URINE POC: 30 mg/L
CREATININE, URINE POC: 200 mg/dL
Microalbumin/creat ratio (POC): <30 mg/g

## 2013-05-29 MED ORDER — SERTRALINE 100 MG TAB
100 mg | ORAL_TABLET | Freq: Every day | ORAL | Status: DC
Start: 2013-05-29 — End: 2014-07-24

## 2013-05-29 MED ORDER — CLONAZEPAM 0.5 MG TAB
0.5 mg | ORAL_TABLET | Freq: Two times a day (BID) | ORAL | Status: DC
Start: 2013-05-29 — End: 2018-11-28

## 2013-05-29 MED ORDER — METFORMIN 1,000 MG TAB
1000 mg | ORAL_TABLET | Freq: Two times a day (BID) | ORAL | Status: AC
Start: 2013-05-29 — End: ?

## 2013-05-29 MED ORDER — GLIPIZIDE 5 MG TAB
5 mg | ORAL_TABLET | ORAL | Status: DC
Start: 2013-05-29 — End: 2014-07-24

## 2013-05-29 NOTE — Progress Notes (Signed)
Chief Complaint   Patient presents with   ??? Diabetes   ??? Depression     med refill     Reviewed record in preparation for visit and have obtained necessary documentation.    1. Have you been to the ER, urgent care clinic since your last visit?  Hospitalized since your last visit?No    2. Have you seen or consulted any other health care providers outside of the [facility] [facility] since your last visit?  Include any pap smears or colon screening. No

## 2013-05-29 NOTE — Progress Notes (Signed)
Here for refill of clonazepam and lab for HgbA1C    Last HgbA1C in November was 5.7    Dr. Pascal Lux put her on gabapentin-- after two days taking it, she had rectal bleeding -- was out of town at the time and went to the ED however was never seen because it took so long for her to be seen; pt states that she is continuing to have low back pain; states that she would like to try steroid pills for it again     Has not had any further bleeding since stopping the gabapentin    Sees GI -- Dr. Milinda Pointer -- had EGD and colonoscopy    Plan:  followup with Dr. Milinda Pointer  followup with Dr. Pascal Lux    Lab:  HgbA1C, urine microalbumin    Monthly followup due to ongoing issues -- pt has been advised to do this several times    rx clonazepam given

## 2013-05-30 LAB — CBC W/O DIFF
HCT: 45.3 % (ref 34.0–46.6)
HGB: 15.6 g/dL (ref 11.1–15.9)
MCH: 31.4 pg (ref 26.6–33.0)
MCHC: 34.4 g/dL (ref 31.5–35.7)
MCV: 91 fL (ref 79–97)
PLATELET: 228 10*3/uL (ref 150–379)
RBC: 4.97 x10E6/uL (ref 3.77–5.28)
RDW: 13.9 % (ref 12.3–15.4)
WBC: 8 10*3/uL (ref 3.4–10.8)

## 2013-05-30 LAB — HEMOGLOBIN A1C WITH EAG: Hemoglobin A1c: 6.8 % — ABNORMAL HIGH (ref 4.8–5.6)

## 2013-06-01 NOTE — Progress Notes (Signed)
Quick Note:    Diabetes control has improved  Normal kidney function  No anemia    ______

## 2014-07-24 DIAGNOSIS — S322XXA Fracture of coccyx, initial encounter for closed fracture: Secondary | ICD-10-CM

## 2014-07-24 NOTE — ED Notes (Signed)
Pt ambulated to room and drove herself here.  Pt c/o tailbone pain x 3 days after slipping in the tub, landing on tailbone.  Didn't hit head and no LOC.  No numbness or tingling.

## 2014-07-24 NOTE — ED Notes (Signed)
The patient was discharged home by [place], [place]  in stable condition. The patient is alert and oriented, in no respiratory distress and discharge vital signs obtained. The patient's diagnosis, condition and treatment were explained to pt. The patient expressed understanding. 1 prescription given. No work/school note given. A discharge plan has been developed. A case manager was not involved in the process. Aftercare instructions were given to the pt.  Pt sitting in her ride's room while that person is being seen.

## 2014-07-24 NOTE — ED Notes (Signed)
Pt states she will get a ride so she can have pain medicine

## 2014-07-24 NOTE — ED Notes (Signed)
Jody [name], [place] at bedside to discuss results

## 2014-07-24 NOTE — ED Provider Notes (Signed)
HPI Comments: 44 year old female with PMHx of diabetic neuropathy, endometrial thickening, lumbar disc disease (s/p lumbar surgery several years ago), uterine cancer, asthma, GERD, diabetes, thyroid disease, and depression/anxiety presents today with complaint of low back and tailbone pain after slipping 2 days ago while she was in the tub and fell, landing directly on the left side of her buttocks. She had pain immediately, but thought it would get better on its own. Since then her pain has worsened and she is having increased urinary urgency. She denies changes or loss in bowel function. Denies urinary or bowel retention. She has pain that radiates into the left buttock, but does not radiate into the left leg. She denies leg weakness, numbness, or tingling. States her pain feels like "burning" pain.     Patient states she feels better walking compared to sitting/laying.    Patient is a 45 y.o. female presenting with coccyx pain.   Tailbone Pain          Past Medical History:   Diagnosis Date   ??? Endometrial thickening on ultra sound    ??? Diabetic neuropathy [facility])      Per pt diagnosed by a physician a [facility]   ??? Lumbar disc disease    ??? Cancer [facility]) 2013     uterine   ??? Asthma    ??? GERD (gastroesophageal reflux disease)    ??? Diabetes ([place])    ??? Thyroid disease    ??? Psychiatric disorder      depression, anxiety       Past Surgical History:   Procedure Laterality Date   ??? Hx orthopaedic       back   ??? Hx appendectomy     ??? Pr hysteroscopy,w/endo bx N/A 02/06/2012     OPERATIVE HYSTEROSCOPY, RESECTION SUBMUCOSSAL FIBROIDS performed by Guy Begin, MD at [facility]   ??? Hx tubal ligation  1994   ??? Hx gyn  03/05/12     TLH/BSO/Lymphadenectomy         Family History:   Problem Relation Age of Onset   ??? Seizures Mother    ??? Seizures Father    ??? Diabetes Mother    ??? Asthma Mother    ??? High Cholesterol Mother    ??? Heart Attack Mother    ??? Hypertension Father    ??? Cancer Father       throat cancer   ??? Heart Attack Father    ??? Hypertension Sister    ??? Hypertension Paternal Grandmother    ??? Diabetes Paternal Grandmother    ??? Breast Cancer Paternal Grandmother    ??? Cancer Paternal Grandfather      leukemia       History     Social History   ??? Marital Status: MARRIED     Spouse Name: N/A   ??? Number of Children: N/A   ??? Years of Education: N/A     Occupational History   ??? Not on file.     Social History Main Topics   ??? Smoking status: Current Every Day Smoker -- 0.50 packs/day for 10 years     Types: Cigarettes     Last Attempt to Quit: 07/11/2010   ??? Smokeless tobacco: Never Used   ??? Alcohol Use: No   ??? Drug Use: No   ??? Sexual Activity:     Partners: Male     Patent examiner Protection: Surgical      Comment: BTL  19 years ago     Other Topics Concern   ??? Not on file     Social History Narrative           ALLERGIES: Erythromycin; Levaquin; Macrolide antibiotics; Nsaids (non-steroidal anti-inflammatory drug); Pcn; and Ultram      Review of Systems   All other systems reviewed and are negative.      Filed Vitals:    07/24/14 2035   BP: 152/72   Pulse: 80   Temp: 98.7 ??F (37.1 ??C)   Resp: 16   Height: 5\' 5"  (1.651 m)   Weight: 112.152 kg (247 lb 4 oz)   SpO2: 97%            Physical Exam   Constitutional: She is oriented to person, place, and time. She appears well-developed and well-nourished.   Neck: Normal range of motion and full passive range of motion without pain. Neck supple.   Cardiovascular: Normal heart sounds and intact distal pulses.    Pulmonary/Chest: Effort normal and breath sounds normal.   Abdominal: Normal appearance.   Musculoskeletal:        Lumbar back: She exhibits decreased range of motion, tenderness, bony tenderness and deformity (healed scar ). She exhibits no swelling, no edema and no laceration.   Neurological: She is alert and oriented to person, place, and time. She has normal strength. No sensory deficit. Gait normal.   Vitals reviewed.       MDM   Number of Diagnoses or Management Options     Amount and/or Complexity of Data Reviewed  Tests in the radiology section of CPT??: ordered and reviewed  Discuss the patient with other providers: yes (Dr. Mallie Mussel (ED attending))  Independent visualization of images, tracings, or specimens: yes        Procedures      Low concern for cauda equina syndrome, given intact motor and sensory exam. No radiation of pain either. Patient is stable and pain is mechanical in nature. Plan for follow up with ortho spine. Patient is not currently having any UTI symptoms and denies dysuria. Explained to her that the urine sample would be sent for culture and if positive, she will be contacted.

## 2014-07-24 NOTE — ED Notes (Signed)
Pt states that her ride is also a patient here in the ER.  Will see if that pt is able to drive before giving pain med

## 2014-07-25 ENCOUNTER — Inpatient Hospital Stay
Admit: 2014-07-25 | Discharge: 2014-07-25 | Disposition: A | Discharge status: Home / Self Care | Payer: Self-pay | Attending: Emergency Medicine

## 2014-07-25 LAB — URINALYSIS W/ REFLEX CULTURE
Bilirubin: NEGATIVE
Glucose: 500 mg/dL — AB
Ketone: NEGATIVE mg/dL
Leukocyte Esterase: NEGATIVE
Nitrites: NEGATIVE
Protein: NEGATIVE mg/dL
Specific gravity: 1.025 (ref 1.003–1.030)
Urobilinogen: 0.2 EU/dL (ref 0.2–1.0)
pH (UA): 6 (ref 5.0–8.0)

## 2014-07-25 MED ORDER — OXYCODONE-ACETAMINOPHEN 5 MG-325 MG TAB
5-325 mg | ORAL | Status: AC
Start: 2014-07-25 — End: 2014-07-24
  Administered 2014-07-25: 02:00:00 via ORAL

## 2014-07-25 MED ORDER — OXYCODONE-ACETAMINOPHEN 5 MG-325 MG TAB
5-325 mg | ORAL_TABLET | ORAL | Status: DC | PRN
Start: 2014-07-25 — End: 2016-11-14

## 2014-07-25 MED FILL — OXYCODONE-ACETAMINOPHEN 5 MG-325 MG TAB: 5-325 mg | ORAL | Qty: 1

## 2014-07-26 LAB — CULTURE, URINE
Colonies Counted: >100000
Colony Count: >100000

## 2014-07-30 ENCOUNTER — Encounter: Attending: Family Medicine | Primary: Family Medicine

## 2014-08-09 ENCOUNTER — Inpatient Hospital Stay
Admit: 2014-08-09 | Discharge: 2014-08-09 | Disposition: A | Discharge status: Home / Self Care | Payer: Self-pay | Attending: Emergency Medicine

## 2014-08-09 DIAGNOSIS — M5116 Intervertebral disc disorders with radiculopathy, lumbar region: Secondary | ICD-10-CM

## 2014-08-09 LAB — CBC WITH AUTOMATED DIFF
ABS. BASOPHILS: 0 10*3/uL (ref 0.0–0.1)
ABS. EOSINOPHILS: 0.3 10*3/uL (ref 0.0–0.4)
ABS. LYMPHOCYTES: 1.9 10*3/uL (ref 0.8–3.5)
ABS. MONOCYTES: 0.3 10*3/uL (ref 0.0–1.0)
ABS. NEUTROPHILS: 5.4 10*3/uL (ref 1.8–8.0)
BASOPHILS: 0 % (ref 0–1)
EOSINOPHILS: 4 % (ref 0–7)
HCT: 43.8 % (ref 35.0–47.0)
HGB: 15.6 g/dL (ref 11.5–16.0)
LYMPHOCYTES: 24 % (ref 12–49)
MCH: 31.3 PG (ref 26.0–34.0)
MCHC: 35.6 g/dL (ref 30.0–36.5)
MCV: 88 FL (ref 80.0–99.0)
MONOCYTES: 4 % — ABNORMAL LOW (ref 5–13)
NEUTROPHILS: 68 % (ref 32–75)
PLATELET: 219 10*3/uL (ref 150–400)
RBC: 4.98 M/uL (ref 3.80–5.20)
RDW: 12.4 % (ref 11.5–14.5)
WBC: 7.9 10*3/uL (ref 3.6–11.0)

## 2014-08-09 LAB — METABOLIC PANEL, BASIC
Anion gap: 8 mmol/L (ref 5–15)
BUN/Creatinine ratio: 13 (ref 12–20)
BUN: 9 MG/DL (ref 6–20)
CO2: 29 mmol/L (ref 21–32)
Calcium: 8.1 MG/DL — ABNORMAL LOW (ref 8.5–10.1)
Chloride: 102 mmol/L (ref 97–108)
Creatinine: 0.71 MG/DL (ref 0.55–1.02)
GFR est AA: >60 mL/min/{1.73_m2} (ref >60)
GFR est non-AA: >60 mL/min/{1.73_m2} (ref >60)
Glucose: 329 mg/dL — ABNORMAL HIGH (ref 65–100)
Potassium: 4 mmol/L (ref 3.5–5.1)
Sodium: 139 mmol/L (ref 136–145)

## 2014-08-09 LAB — EKG, 12 LEAD, INITIAL
Atrial Rate: 312 {beats}/min
Calculated P Axis: 30 degrees
Calculated R Axis: 47 degrees
Calculated T Axis: 42 degrees
Q-T Interval: 396 ms
QRS Duration: 82 ms
QTC Calculation (Bezet): 433 ms
Ventricular Rate: 72 {beats}/min

## 2014-08-09 MED ORDER — LIDOCAINE 5 % (700 MG/PATCH) ADHESIVE PATCH
5 % | CUTANEOUS | Status: DC
Start: 2014-08-09 — End: 2016-11-14

## 2014-08-09 MED ORDER — KETOROLAC TROMETHAMINE 30 MG/ML INJECTION
30 mg/mL (1 mL) | INTRAMUSCULAR | Status: AC
Start: 2014-08-09 — End: 2014-08-09
  Administered 2014-08-09: 15:00:00 via INTRAVENOUS

## 2014-08-09 MED ORDER — ONDANSETRON 4 MG TAB, RAPID DISSOLVE
4 mg | ORAL | Status: AC
Start: 2014-08-09 — End: 2014-08-09
  Administered 2014-08-09: 15:00:00 via ORAL

## 2014-08-09 MED ORDER — PROMETHAZINE 25 MG TAB
25 mg | ORAL_TABLET | Freq: Four times a day (QID) | ORAL | Status: DC | PRN
Start: 2014-08-09 — End: 2018-11-28

## 2014-08-09 MED FILL — ONDANSETRON 4 MG TAB, RAPID DISSOLVE: 4 mg | ORAL | Qty: 1

## 2014-08-09 MED FILL — KETOROLAC TROMETHAMINE 30 MG/ML INJECTION: 30 mg/mL (1 mL) | INTRAMUSCULAR | Qty: 1

## 2014-08-09 NOTE — ED Notes (Addendum)
Patient's call bell rang. Nurse to check on patient. She reports nausea. MD notified and order recv'd.  Updated patient that we were waiting on another lab result before any additional medications.

## 2014-08-09 NOTE — ED Notes (Signed)
Triage note: Patient arrives reporting pain in LEFT low back and buttocks that radiates into leg.

## 2014-08-09 NOTE — ED Provider Notes (Signed)
HPI Comments: Pt is 44 yo WF with DM, depression and anxiety who presents with back pain x weeks radiating into left buttock and down left lag. Reports last night pain was so bad her left leg became numb for 1 hour. Pt reports she did fall in the tub when she was seen here about 2 weeks ago and was taking percocet for pain for fx coccyx. She denies bowel or bladder incontinence. She reports [facility] but did not [facility]100. She currenty lives in [place] and is visiting. She is in process of seeing a spine specialist in [place] but has not yet. pcp has her on norco.  Pt reports she can not remember if she has had another fall since her last visit.she denies taking too much medication    Patient is a 44 y.o. female presenting with back pain. The history is provided by the patient.   Back Pain   Associated symptoms include numbness. Pertinent negatives include no fever and no weakness.        Past Medical History:   Diagnosis Date   ??? Endometrial thickening on ultra sound    ??? Diabetic neuropathy [facility])      Per pt diagnosed by a physician a [facility]   ??? Lumbar disc disease    ??? Cancer [facility]) 2013     uterine   ??? Asthma    ??? GERD (gastroesophageal reflux disease)    ??? Diabetes ([place])    ??? Thyroid disease    ??? Psychiatric disorder      depression, anxiety       Past Surgical History:   Procedure Laterality Date   ??? Hx orthopaedic       back   ??? Hx appendectomy     ??? Pr hysteroscopy,w/endo bx N/A 02/06/2012     OPERATIVE HYSTEROSCOPY, RESECTION SUBMUCOSSAL FIBROIDS performed by Guy Begin, MD at [facility]   ??? Hx tubal ligation  1994   ??? Hx gyn  03/05/12     TLH/BSO/Lymphadenectomy         Family History:   Problem Relation Age of Onset   ??? Seizures Mother    ??? Seizures Father    ??? Diabetes Mother    ??? Asthma Mother    ??? High Cholesterol Mother    ??? Heart Attack Mother    ??? Hypertension Father    ??? Cancer Father      throat cancer   ??? Heart Attack Father     ??? Hypertension Sister    ??? Hypertension Paternal Grandmother    ??? Diabetes Paternal Grandmother    ??? Breast Cancer Paternal Grandmother    ??? Cancer Paternal Grandfather      leukemia       History     Social History   ??? Marital Status: MARRIED     Spouse Name: N/A   ??? Number of Children: N/A   ??? Years of Education: N/A     Occupational History   ??? Not on file.     Social History Main Topics   ??? Smoking status: Current Every Day Smoker -- 0.50 packs/day for 10 years     Types: Cigarettes     Last Attempt to Quit: 07/11/2010   ??? Smokeless tobacco: Never Used   ??? Alcohol Use: No   ??? Drug Use: No   ??? Sexual Activity:     Partners: Male  Birth Control/ Protection: Surgical      Comment: BTL 19 years ago     Other Topics Concern   ??? Not on file     Social History Narrative           ALLERGIES: Erythromycin; Levaquin; Macrolide antibiotics; Nsaids (non-steroidal anti-inflammatory drug); Pcn; and Ultram      Review of Systems   Constitutional: Negative for fever.   Genitourinary: Negative for decreased urine volume and difficulty urinating.   Musculoskeletal: Positive for back pain.   Neurological: Positive for numbness. Negative for weakness.   Psychiatric/Behavioral:        Denies SI or HI   All other systems reviewed and are negative.      Filed Vitals:    08/09/14 0938   BP: 146/81   Pulse: 72   Temp: 97.8 ??F (36.6 ??C)   Resp: 16   Height: 5\' 5"  (1.651 m)   Weight: 112.946 kg (249 lb)   SpO2: 96%            Physical Exam   Constitutional: She is oriented to person, place, and time. She appears well-developed and well-nourished. No distress.   HENT:   Head: Normocephalic and atraumatic.   Eyes: EOM are normal. Pupils are equal, round, and reactive to light.   Neck: Normal range of motion. Neck supple.   Cardiovascular: Normal rate, regular rhythm, normal heart sounds and intact distal pulses.  Exam reveals no friction rub.    No murmur heard.  Pulmonary/Chest: Effort normal and breath sounds normal. No respiratory  distress. She has no wheezes. She has no rales. She exhibits no tenderness.   Abdominal: Soft. Bowel sounds are normal. She exhibits no distension. There is no tenderness. There is no rebound and no guarding.   Musculoskeletal: Normal range of motion. She exhibits no edema or tenderness.   Neurological: She is oriented to person, place, and time. Coordination normal.   Answers questions but appears slow to respond    + ttp entire L spine no step off; + ttp left SI joint; + slr on left; sensation and motor intact- palpable dp and pt pulses   Skin: Skin is warm and dry. She is not diaphoretic. No pallor.   Psychiatric: Her behavior is normal.   Nursing note and vitals reviewed.       MDM  Number of Diagnoses or Management Options  Hyperglycemia:   Lumbar herniated disc:   Lumbar radiculopathy:   Diagnosis management comments: Reviewed prior imaging mri lumbar spine 2014 and visit 1/15 from pcp and dx with myofascial syndrome. ? Sciatica vs herniated disc. ? Coccyx fx acute or old. No narcotics given pt appears overmedicated in general and cant remember if she has had another fall. Check ekg for prolong qt. Ct l spine to eval for possible pathology fx herniated disc spinal stenosis, check for anmeia and electrolytes with ? syncope       Amount and/or Complexity of Data Reviewed  Clinical lab tests: ordered and reviewed  Tests in the radiology section of CPT??: ordered and reviewed    Patient Progress  Patient progress: stable      Procedures    EKG interpretation: (Preliminary)  Rhythm: normal sinus rhythm; and regular . Rate (approx.): 70; Axis: normal; P wave: normal; QRS interval: normal ; ST/T wave: non-specific changes; no prolong qt, pac's    10:53 AM  Spoke with pt about herniated disc at l4-l5 could be some of cause of her pain. Her  prior surgery was by dr Lezlie Octave NSGY. Has not followed with him in a while    11:20 AM  Spoke with pt at length she has not taken her insulin today she needs to  get blood sugar under better control. Reports she will be here for another week so gave SFFM to follow up with. Copy of lumbar ct given so she can bring to physician here or in [place]. Encouraged pt to follow up with ortho as previously referred or dr Lezlie Octave her surgeon for possible ESI as she cant do oral steroids due to blood sugar or nsaids from GI intolerance. Pt on a lot of sedating meds and do not feel comfortable rx'ng narcotics. Gave pt copy of opiate prescribing pamphlet.  Especially given she cant remember if she has fallen and PMP has no meds listed but pt has percocet rx bottle here and was on norco in [place]. rx for lidoderm patches and phenergan given. She can return if worsening sx. No sx of spinal cord compression. No EMC

## 2014-08-09 NOTE — ED Notes (Addendum)
Patient pulled two pill bottles out of her bra. Nurse asked to see pill bottles and patient was reluctant to give them to this nurse. One bottle was for her Klonopin and one for Percocet with 2 1/2 pills left. Both bottles had pills in them. During med rec patient reports that she took her last Percocet last evening.

## 2014-08-09 NOTE — ED Notes (Signed)
Patient given all discharge paperwork and all questions answered and phone numbers highlighted. Patient seen ambulating out of the ED with steady gait and all belongings. Patient in no apparent distress at this time.

## 2015-03-08 NOTE — Progress Notes (Signed)
Resolved episode.

## 2015-11-22 NOTE — Telephone Encounter (Signed)
Please call regarding recent diagnosis she received

## 2015-11-24 NOTE — Telephone Encounter (Signed)
Attempted to return the pt call again and unable to reach. Left a message to call back if needed.

## 2016-03-05 ENCOUNTER — Encounter: Attending: Family Medicine | Primary: Family Medicine

## 2016-11-14 ENCOUNTER — Emergency Department: Admit: 2016-11-14 | Payer: MEDICARE | Primary: Family Medicine

## 2016-11-14 ENCOUNTER — Inpatient Hospital Stay
Admit: 2016-11-14 | Discharge: 2016-11-14 | Disposition: A | Discharge status: Home / Self Care | Payer: MEDICARE | Attending: Emergency Medicine

## 2016-11-14 DIAGNOSIS — M545 Low back pain: Secondary | ICD-10-CM

## 2016-11-14 MED ORDER — OXYCODONE-ACETAMINOPHEN 7.5 MG-325 MG TAB
ORAL | Status: DC
Start: 2016-11-14 — End: 2016-11-14

## 2016-11-14 MED ORDER — OXYCODONE-ACETAMINOPHEN 5 MG-325 MG TAB
5-325 mg | ORAL_TABLET | ORAL | 0 refills | Status: AC | PRN
Start: 2016-11-14 — End: ?

## 2016-11-14 MED FILL — OXYCODONE-ACETAMINOPHEN 7.5 MG-325 MG TAB: ORAL | Qty: 1
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
## 2016-11-14 NOTE — ED Notes (Signed)
Pt has driven herself to ER Dr Gordy Levan made aware. Discussed with pt. Pt requesting pain script Dr Gordy Levan made aware of pt request.
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
## 2016-11-14 NOTE — ED Notes (Signed)
The patient was discharged home by Dr. Gill.  Pt ambulatory to discharge with steady gait.

## 2018-11-28 DIAGNOSIS — I5023 Acute on chronic systolic (congestive) heart failure: Secondary | ICD-10-CM

## 2018-11-28 NOTE — ED Provider Notes (Signed)
The patient is a 48 year old female who presents to the ED from [place], visiting family in [place] with a past medical history significant for asthma, uterine cancer, CHF, diabetes, diabetic neuropathy, GERD, lumbar disc disease, anxiety and depression, status post appendectomy, hysterectomy, back surgery and tubal ligation who presents to the ED with family at the bedside with a complaint of increased generalized swelling especially legs, feet and hands with intermittent episode of shortness of breath on exertion and at rest, dry cough, runny nose and congestion.  The patient was recently placed on Bumex by her PCP in [place].  She denies any headache, sore throat, cough or congestion, fever and chills, nausea, vomiting, abdominal pain, diarrhea, constipation, dysuria, dizziness, extremity weakness or numbness, sick contact, skin rash.           Past Medical History:   Diagnosis Date   ??? Asthma    ??? Cancer ([place]) 2013    uterine   ??? CHF (congestive heart failure) ([place])    ??? Diabetes ([place])    ??? Diabetic neuropathy ([place])     Per pt diagnosed by a physician a [facility]   ??? Endometrial thickening on ultra sound    ??? GERD (gastroesophageal reflux disease)    ??? Lumbar disc disease    ??? Psychiatric disorder     depression, anxiety       Past Surgical History:   Procedure Laterality Date   ??? HX APPENDECTOMY     ??? HX GYN  03/05/12    TLH/BSO/Lymphadenectomy   ??? HX ORTHOPAEDIC      back   ??? HX TUBAL LIGATION  1994   ??? HYSTEROSCOPY,W/ENDO BX N/A 02/06/2012    OPERATIVE HYSTEROSCOPY, RESECTION SUBMUCOSSAL FIBROIDS performed by Guy Begin, MD at [facility] MAIN OR         Family History:   Problem Relation Age of Onset   ??? Seizures Mother    ??? Diabetes Mother    ??? Asthma Mother    ??? High Cholesterol Mother    ??? Heart Attack Mother    ??? Seizures Father    ??? Hypertension Father    ??? Cancer Father         throat cancer   ??? Heart Attack Father    ??? Hypertension Sister    ??? Hypertension Paternal Grandmother    ???  Diabetes Paternal Grandmother    ??? Breast Cancer Paternal Grandmother    ??? Cancer Paternal Grandfather         leukemia       Social History     Socioeconomic History   ??? Marital status: MARRIED     Spouse name: Not on file   ??? Number of children: Not on file   ??? Years of education: Not on file   ??? Highest education level: Not on file   Occupational History   ??? Not on file   Social Needs   ??? Financial resource strain: Not on file   ??? Food insecurity     Worry: Not on file     Inability: Not on file   ??? Transportation needs     Medical: Not on file     Non-medical: Not on file   Tobacco Use   ??? Smoking status: Current Every Day Smoker     Packs/day: 0.50     Years: 10.00     Pack years: 5.00     Types: Cigarettes     Last attempt to  quit: 07/11/2010     Years since quitting: 8.3   ??? Smokeless tobacco: Never Used   Substance and Sexual Activity   ??? Alcohol use: No     Alcohol/week: 0.0 standard drinks   ??? Drug use: No   ??? Sexual activity: Not Currently     Partners: Male     Birth control/protection: Surgical     Comment: BTL 19 years ago   Lifestyle   ??? Physical activity     Days per week: Not on file     Minutes per session: Not on file   ??? Stress: Not on file   Relationships   ??? Social Product manager on phone: Not on file     Gets together: Not on file     Attends religious service: Not on file     Active member of club or organization: Not on file     Attends meetings of clubs or organizations: Not on file     Relationship status: Not on file   ??? Intimate partner violence     Fear of current or ex partner: Not on file     Emotionally abused: Not on file     Physically abused: Not on file     Forced sexual activity: Not on file   Other Topics Concern   ??? Not on file   Social History Narrative   ??? Not on file         ALLERGIES: Erythromycin; Levaquin [levofloxacin]; Macrolide antibiotics; Nsaids (non-steroidal anti-inflammatory drug); Pcn [penicillins]; and Ultram [tramadol]    Review of Systems   All other  systems reviewed and are negative.      Vitals:    11/28/18 2113 11/28/18 2333 11/28/18 2335   BP: 156/69 142/79    Pulse: 82 76    Resp: 19 18    Temp: 98.6 ??F (37 ??C)     SpO2: 95% 94% 94%   Weight: 138.3 kg (304 lb 14.3 oz) 136.1 kg (300 lb)    Height: 5\' 5"  (1.651 m) 5\' 5"  (1.651 m)             Physical Exam  Vitals signs and nursing note reviewed. Exam conducted with a chaperone present.          CONSTITUTIONAL: Well-appearing; well-nourished; in no apparent distress  HEAD: Normocephalic; atraumatic  EYES: PERRL; EOM intact; conjunctiva and sclera are clear bilaterally.  ENT: No rhinorrhea; normal pharynx with no tonsillar hypertrophy; mucous membranes pink/moist, no erythema, no exudate.  NECK: Supple; non-tender; no cervical lymphadenopathy  CARD: Normal S1, S2; no murmurs, rubs, or gallops. Regular rate and rhythm.  RESP: Normal respiratory effort; breath sounds clear and equal bilaterally; no wheezes, rhonchi, or rales.  ABD: Normal bowel sounds; non-distended; non-tender; no palpable organomegaly, no masses, no bruits.  Back Exam: Normal inspection; no vertebral point tenderness, no CVA tenderness. Normal range of motion.  EXT: Normal ROM in all four extremities; non-tender to palpation; no swelling or deformity; distal pulses are normal, no edema.  SKIN: Warm; dry; no rash.  NEURO:Alert and oriented x 3, coherent, NII-XII grossly intact, sensory and motor are non-focal.        MDM  Number of Diagnoses or Management Options  Acute on chronic systolic congestive heart failure [facility]):   Peripheral edema:   Diagnosis management comments: Assessment: 48 year old female who presents to the ED for evaluation for increased swelling and anasarca with history of CHF.  The patient is  hemodynamically stable and appears well.  Differential diagnosis include hypovolemia secondary to fluid followed electrolyte abnormality, CHF with acute exacerbation, ACS.    Plan: EKG/lab/chest x-ray/Lasix/serial exam/ Monitor and  Reevaluate.         Amount and/or Complexity of Data Reviewed  Clinical lab tests: ordered and reviewed  Tests in the radiology section of CPT??: ordered and reviewed  Tests in the medicine section of CPT??: reviewed and ordered  Discussion of test results with the performing providers: yes  Decide to obtain previous medical records or to obtain history from someone other than the patient: yes  Obtain history from someone other than the patient: yes  Review and summarize past medical records: yes  Discuss the patient with other providers: yes  Independent visualization of images, tracings, or specimens: yes    Risk of Complications, Morbidity, and/or Mortality  Presenting problems: moderate  Diagnostic procedures: moderate  Management options: moderate  General comments: Total critical care time spent exclusive of procedures:  45 minutes    Patient Progress  Patient progress: stable         Procedures    ED EKG interpretation:  Rhythm: normal sinus rhythm; and regular . Rate (approx.): 72; Axis: normal; P wave: normal; QRS interval: normal ; ST/T wave: normal; in  Lead: Diffusely; Other findings: borderline ekg. This EKG was interpreted by Eula Listen, MD,ED Provider.    XRAY INTERPRETATION (ED MD)  Chest Xray  No acute process seen. Normal heart size. No bony abnormalities. No infiltrate.  Eula Listen, MD 11:52 PM      Progress Note:   Pt has been reexamined by Eula Listen, MD. Pt is feeling much better. Symptoms have improved.  The patient had approximately 800 cc of urinary output.  She denies any further discomfort. all available results have been reviewed with pt and any available family. Pt understands sx, dx, and tx in ED. Care plan has been outlined and questions have been answered. Pt is ready to go home. Will send home on CHF and peripheral edema instruction.  Resume current medication as usual. Outpatient referral with PCP as needed. Written by Eula Listen, MD,11:52 PM    .   .

## 2018-11-28 NOTE — ED Notes (Signed)
Pt c/o peripheral edema x 2 days. Pt was recently dx with CHF and prescribed Bumex. Pt states that here feet have been so swollen that her toes wont touch the floor. Pt states she has been experiencing SOB. Hx of Asthma and COPD.

## 2018-11-28 NOTE — ED Triage Notes (Signed)
Pt c/o peripheral edema x 2 days. Pt was recently dx with CHF and prescribed Bumex. Pt states that here feet have been so swollen that her toes wont touch the floor. Pt states she has been experiencing SOB. Hx of Asthma and COPD.

## 2018-11-28 NOTE — ED Provider Notes (Signed)
The patient is a 48 year old female who presents to the ED from [place], visiting family in [place] with a past medical history significant for asthma, uterine cancer, CHF, diabetes, diabetic neuropathy, GERD, lumbar disc disease, anxiety and depression, status post appendectomy, hysterectomy, back surgery and tubal ligation who presents to the ED with family at the bedside with a complaint of increased generalized swelling especially legs, feet and hands with intermittent episode of shortness of breath on exertion and at rest, dry cough, runny nose and congestion.  The patient was recently placed on Bumex by her PCP in [place].  She denies any headache, sore throat, cough or congestion, fever and chills, nausea, vomiting, abdominal pain, diarrhea, constipation, dysuria, dizziness, extremity weakness or numbness, sick contact, skin rash.           Past Medical History:   Diagnosis Date   ??? Asthma    ??? Cancer ([place]) 2013    uterine   ??? CHF (congestive heart failure) ([place])    ??? Diabetes ([place])    ??? Diabetic neuropathy ([place])     Per pt diagnosed by a physician a [facility]   ??? Endometrial thickening on ultra sound    ??? GERD (gastroesophageal reflux disease)    ??? Lumbar disc disease    ??? Psychiatric disorder     depression, anxiety       Past Surgical History:   Procedure Laterality Date   ??? HX APPENDECTOMY     ??? HX GYN  03/05/12    TLH/BSO/Lymphadenectomy   ??? HX ORTHOPAEDIC      back   ??? HX TUBAL LIGATION  1994   ??? HYSTEROSCOPY,W/ENDO BX N/A 02/06/2012    OPERATIVE HYSTEROSCOPY, RESECTION SUBMUCOSSAL FIBROIDS performed by Guy Begin, MD at [facility] MAIN OR         Family History:   Problem Relation Age of Onset   ??? Seizures Mother    ??? Diabetes Mother    ??? Asthma Mother    ??? High Cholesterol Mother    ??? Heart Attack Mother    ??? Seizures Father    ??? Hypertension Father    ??? Cancer Father         throat cancer   ??? Heart Attack Father    ??? Hypertension Sister    ??? Hypertension Paternal Grandmother     ??? Diabetes Paternal Grandmother    ??? Breast Cancer Paternal Grandmother    ??? Cancer Paternal Grandfather         leukemia       Social History     Socioeconomic History   ??? Marital status: MARRIED     Spouse name: Not on file   ??? Number of children: Not on file   ??? Years of education: Not on file   ??? Highest education level: Not on file   Occupational History   ??? Not on file   Social Needs   ??? Financial resource strain: Not on file   ??? Food insecurity     Worry: Not on file     Inability: Not on file   ??? Transportation needs     Medical: Not on file     Non-medical: Not on file   Tobacco Use   ??? Smoking status: Current Every Day Smoker     Packs/day: 0.50     Years: 10.00     Pack years: 5.00     Types: Cigarettes     Last attempt to  quit: 07/11/2010     Years since quitting: 8.3   ??? Smokeless tobacco: Never Used   Substance and Sexual Activity   ??? Alcohol use: No     Alcohol/week: 0.0 standard drinks   ??? Drug use: No   ??? Sexual activity: Not Currently     Partners: Male     Birth control/protection: Surgical     Comment: BTL 19 years ago   Lifestyle   ??? Physical activity     Days per week: Not on file     Minutes per session: Not on file   ??? Stress: Not on file   Relationships   ??? Social Product manager on phone: Not on file     Gets together: Not on file     Attends religious service: Not on file     Active member of club or organization: Not on file     Attends meetings of clubs or organizations: Not on file     Relationship status: Not on file   ??? Intimate partner violence     Fear of current or ex partner: Not on file     Emotionally abused: Not on file     Physically abused: Not on file     Forced sexual activity: Not on file   Other Topics Concern   ??? Not on file   Social History Narrative   ??? Not on file         ALLERGIES: Erythromycin; Levaquin [levofloxacin]; Macrolide antibiotics; Nsaids (non-steroidal anti-inflammatory drug); Pcn [penicillins]; and Ultram [tramadol]    Review of Systems    All other systems reviewed and are negative.      Vitals:    11/28/18 2113 11/28/18 2333 11/28/18 2335   BP: 156/69 142/79    Pulse: 82 76    Resp: 19 18    Temp: 98.6 ??F (37 ??C)     SpO2: 95% 94% 94%   Weight: 138.3 kg (304 lb 14.3 oz) 136.1 kg (300 lb)    Height: 5\' 5"  (1.651 m) 5\' 5"  (1.651 m)             Physical Exam  Vitals signs and nursing note reviewed. Exam conducted with a chaperone present.          CONSTITUTIONAL: Well-appearing; well-nourished; in no apparent distress  HEAD: Normocephalic; atraumatic  EYES: PERRL; EOM intact; conjunctiva and sclera are clear bilaterally.  ENT: No rhinorrhea; normal pharynx with no tonsillar hypertrophy; mucous membranes pink/moist, no erythema, no exudate.  NECK: Supple; non-tender; no cervical lymphadenopathy  CARD: Normal S1, S2; no murmurs, rubs, or gallops. Regular rate and rhythm.  RESP: Normal respiratory effort; breath sounds clear and equal bilaterally; no wheezes, rhonchi, or rales.  ABD: Normal bowel sounds; non-distended; non-tender; no palpable organomegaly, no masses, no bruits.  Back Exam: Normal inspection; no vertebral point tenderness, no CVA tenderness. Normal range of motion.  EXT: Normal ROM in all four extremities; non-tender to palpation; no swelling or deformity; distal pulses are normal, no edema.  SKIN: Warm; dry; no rash.  NEURO:Alert and oriented x 3, coherent, NII-XII grossly intact, sensory and motor are non-focal.        MDM  Number of Diagnoses or Management Options  Acute on chronic systolic congestive heart failure [facility]):   Peripheral edema:   Diagnosis management comments: Assessment: 48 year old female who presents to the ED for evaluation for increased swelling and anasarca with history of CHF.  The patient is  hemodynamically stable and appears well.  Differential diagnosis include hypovolemia secondary to fluid followed electrolyte abnormality, CHF with acute exacerbation, ACS.     Plan: EKG/lab/chest x-ray/Lasix/serial exam/ Monitor and Reevaluate.         Amount and/or Complexity of Data Reviewed  Clinical lab tests: ordered and reviewed  Tests in the radiology section of CPT??: ordered and reviewed  Tests in the medicine section of CPT??: reviewed and ordered  Discussion of test results with the performing providers: yes  Decide to obtain previous medical records or to obtain history from someone other than the patient: yes  Obtain history from someone other than the patient: yes  Review and summarize past medical records: yes  Discuss the patient with other providers: yes  Independent visualization of images, tracings, or specimens: yes    Risk of Complications, Morbidity, and/or Mortality  Presenting problems: moderate  Diagnostic procedures: moderate  Management options: moderate  General comments: Total critical care time spent exclusive of procedures:  45 minutes    Patient Progress  Patient progress: stable         Procedures    ED EKG interpretation:  Rhythm: normal sinus rhythm; and regular . Rate (approx.): 72; Axis: normal; P wave: normal; QRS interval: normal ; ST/T wave: normal; in  Lead: Diffusely; Other findings: borderline ekg. This EKG was interpreted by Eula Listen, MD,ED Provider.    XRAY INTERPRETATION (ED MD)  Chest Xray  No acute process seen. Normal heart size. No bony abnormalities. No infiltrate.  Eula Listen, MD 11:52 PM      Progress Note:   Pt has been reexamined by Eula Listen, MD. Pt is feeling much better. Symptoms have improved.  The patient had approximately 800 cc of urinary output.  She denies any further discomfort. all available results have been reviewed with pt and any available family. Pt understands sx, dx, and tx in ED. Care plan has been outlined and questions have been answered. Pt is ready to go home. Will send home on CHF and peripheral edema instruction.  Resume current medication as usual. Outpatient referral with  PCP as needed. Written by Eula Listen, MD,11:52 PM    .   .

## 2018-11-29 ENCOUNTER — Emergency Department: Admit: 2018-11-29 | Payer: MEDICARE | Primary: Family Medicine

## 2018-11-29 ENCOUNTER — Inpatient Hospital Stay
Admit: 2018-11-29 | Discharge: 2018-11-29 | Disposition: A | Discharge status: Home / Self Care | Payer: MEDICARE | Attending: Emergency Medicine

## 2018-11-29 LAB — URINALYSIS W/ REFLEX CULTURE
BACTERIA, URINE: NEGATIVE /hpf
Bacteria: NEGATIVE /hpf
Bilirubin, Urine: NEGATIVE
Bilirubin: NEGATIVE
Blood, Urine: NEGATIVE
Blood: NEGATIVE
Glucose, Ur: 250 mg/dL — AB
Glucose: 250 mg/dL — AB
Ketone: NEGATIVE mg/dL
Ketones, Urine: NEGATIVE mg/dL
Leukocyte Esterase, Urine: NEGATIVE
Leukocyte Esterase: NEGATIVE
Nitrite, Urine: NEGATIVE
Nitrites: NEGATIVE
Protein, UA: NEGATIVE mg/dL
Protein: NEGATIVE mg/dL
Specific Gravity, UA: 1.008 (ref 1.003–1.030)
Specific gravity: 1.008 (ref 1.003–1.030)
Urobilinogen, UA, POCT: 0.2 EU/dL (ref 0.2–1.0)
Urobilinogen: 0.2 EU/dL (ref 0.2–1.0)
pH (UA): 7 (ref 5.0–8.0)
pH, UA: 7 (ref 5.0–8.0)

## 2018-11-29 LAB — EKG 12-LEAD
Atrial Rate: 72 {beats}/min
Diagnosis: NORMAL
P Axis: 44 degrees
P-R Interval: 152 ms
Q-T Interval: 422 ms
QRS Duration: 82 ms
QTc Calculation (Bazett): 462 ms
R Axis: 49 degrees
T Axis: 47 degrees
Ventricular Rate: 72 {beats}/min

## 2018-11-29 LAB — POC CHEM8
ANION GAP ISTAT,IAGAP: 13 mmol/L (ref 10–20)
Anion gap (POC): 13 mmol/L (ref 10–20)
BUN (POC): 16 mg/dL (ref 9–20)
BUN ISTAT,IBUN: 16 mg/dL (ref 9–20)
CHLORIDE ISTAT,ICL: 97 mmol/L — ABNORMAL LOW (ref 98–107)
CO2 (POC): 34 mmol/L — ABNORMAL HIGH (ref 21–32)
Calcium, ionized (POC): 1.15 mmol/L (ref 1.12–1.32)
Calcium, ionized, POC: 1.15 mmol/L (ref 1.12–1.32)
Chloride (POC): 97 mmol/L — ABNORMAL LOW (ref 98–107)
Creatinine (POC): 0.8 mg/dL (ref 0.6–1.3)
GFR African American: >60 mL/min/{1.73_m2} (ref >60)
GFR Non-African American: >60 mL/min/{1.73_m2} (ref >60)
GFRAA, POC: >60 mL/min/{1.73_m2} (ref >60)
GFRNA, POC: >60 mL/min/{1.73_m2} (ref >60)
GLUCOSE ISTAT,IGLU: 284 mg/dL — ABNORMAL HIGH (ref 65–100)
Glucose (POC): 284 mg/dL — ABNORMAL HIGH (ref 65–100)
HEMATOCRIT ISTAT,IHCT: 45 % (ref 35.0–47.0)
Hematocrit (POC): 45 % (ref 35.0–47.0)
POC Creatinine: 0.8 mg/dL (ref 0.6–1.3)
POTASSIUM ISTAT,IK: 4.3 mmol/L (ref 3.5–5.1)
Potassium (POC): 4.3 mmol/L (ref 3.5–5.1)
SODIUM ISTAT,INA: 139 mmol/L (ref 136–145)
Sodium (POC): 139 mmol/L (ref 136–145)
TCO2 ISTAT,ITCO2: 34 mmol/L — ABNORMAL HIGH (ref 21–32)

## 2018-11-29 LAB — COMPREHENSIVE METABOLIC PANEL
ALT: 32 U/L (ref 12–78)
AST: 41 U/L — ABNORMAL HIGH (ref 15–37)
Albumin/Globulin Ratio: 0.9 — ABNORMAL LOW (ref 1.1–2.2)
Albumin: 3.5 g/dL (ref 3.5–5.0)
Alkaline Phosphatase: 82 U/L (ref 45–117)
Anion Gap: 4 mmol/L — ABNORMAL LOW (ref 5–15)
BUN: 14 MG/DL (ref 6–20)
Bun/Cre Ratio: 14 (ref 12–20)
CO2: 33 mmol/L — ABNORMAL HIGH (ref 21–32)
Calcium: 8.4 MG/DL — ABNORMAL LOW (ref 8.5–10.1)
Chloride: 101 mmol/L (ref 97–108)
Creatinine: 1.01 MG/DL (ref 0.55–1.02)
EGFR IF NonAfrican American: 59 mL/min/{1.73_m2} — ABNORMAL LOW (ref >60)
GFR African American: >60 mL/min/{1.73_m2} (ref >60)
Globulin: 3.8 g/dL (ref 2.0–4.0)
Glucose: 276 mg/dL — ABNORMAL HIGH (ref 65–100)
Potassium: 3.8 mmol/L (ref 3.5–5.1)
Sodium: 138 mmol/L (ref 136–145)
Total Bilirubin: 0.4 MG/DL (ref 0.2–1.0)
Total Protein: 7.3 g/dL (ref 6.4–8.2)

## 2018-11-29 LAB — CBC WITH AUTO DIFFERENTIAL
Basophils %: 0 % (ref 0–1)
Basophils Absolute: 0 10*3/uL (ref 0.0–0.1)
Eosinophils %: 2 % (ref 0–7)
Eosinophils Absolute: 0.2 10*3/uL (ref 0.0–0.4)
Granulocyte Absolute Count: 0 10*3/uL (ref 0.00–0.04)
Hematocrit: 43.3 % (ref 35.0–47.0)
Hemoglobin: 14.7 g/dL (ref 11.5–16.0)
Immature Granulocytes: 0 % (ref 0.0–0.5)
Lymphocytes %: 33 % (ref 12–49)
Lymphocytes Absolute: 2.7 10*3/uL (ref 0.8–3.5)
MCH: 32 PG (ref 26.0–34.0)
MCHC: 33.9 g/dL (ref 30.0–36.5)
MCV: 94.1 FL (ref 80.0–99.0)
MPV: 10.4 FL (ref 8.9–12.9)
Monocytes %: 5 % (ref 5–13)
Monocytes Absolute: 0.4 10*3/uL (ref 0.0–1.0)
NRBC Absolute: 0 10*3/uL (ref 0.00–0.01)
Neutrophils %: 60 % (ref 32–75)
Neutrophils Absolute: 4.9 10*3/uL (ref 1.8–8.0)
Nucleated RBCs: 0 PER 100 WBC
Platelets: 205 10*3/uL (ref 150–400)
RBC: 4.6 M/uL (ref 3.80–5.20)
RDW: 13.2 % (ref 11.5–14.5)
WBC: 8.2 10*3/uL (ref 3.6–11.0)

## 2018-11-29 LAB — TROPONIN: Troponin I: <0.05 ng/mL (ref <0.05)

## 2018-11-29 LAB — CK W/ REFLX CKMB
CK: 61 U/L (ref 26–192)
CK: 61 U/L (ref 26–192)

## 2018-11-29 LAB — PHOSPHORUS
Phosphorus: 2.5 MG/DL — ABNORMAL LOW (ref 2.6–4.7)
Phosphorus: 2.5 MG/DL — ABNORMAL LOW (ref 2.6–4.7)

## 2018-11-29 LAB — PROBNP, N-TERMINAL: BNP: 136 PG/ML — ABNORMAL HIGH (ref <125)

## 2018-11-29 LAB — METABOLIC PANEL, COMPREHENSIVE
A-G Ratio: 0.9 — ABNORMAL LOW (ref 1.1–2.2)
ALT (SGPT): 32 U/L (ref 12–78)
AST (SGOT): 41 U/L — ABNORMAL HIGH (ref 15–37)
Albumin: 3.5 g/dL (ref 3.5–5.0)
Alk. phosphatase: 82 U/L (ref 45–117)
Anion gap: 4 mmol/L — ABNORMAL LOW (ref 5–15)
BUN/Creatinine ratio: 14 (ref 12–20)
BUN: 14 MG/DL (ref 6–20)
Bilirubin, total: 0.4 MG/DL (ref 0.2–1.0)
CO2: 33 mmol/L — ABNORMAL HIGH (ref 21–32)
Calcium: 8.4 MG/DL — ABNORMAL LOW (ref 8.5–10.1)
Chloride: 101 mmol/L (ref 97–108)
Creatinine: 1.01 MG/DL (ref 0.55–1.02)
GFR est AA: >60 mL/min/{1.73_m2} (ref >60)
GFR est non-AA: 59 mL/min/{1.73_m2} — ABNORMAL LOW (ref >60)
Globulin: 3.8 g/dL (ref 2.0–4.0)
Glucose: 276 mg/dL — ABNORMAL HIGH (ref 65–100)
Potassium: 3.8 mmol/L (ref 3.5–5.1)
Protein, total: 7.3 g/dL (ref 6.4–8.2)
Sodium: 138 mmol/L (ref 136–145)

## 2018-11-29 LAB — CBC WITH AUTOMATED DIFF
ABS. BASOPHILS: 0 10*3/uL (ref 0.0–0.1)
ABS. EOSINOPHILS: 0.2 10*3/uL (ref 0.0–0.4)
ABS. IMM. GRANS.: 0 10*3/uL (ref 0.00–0.04)
ABS. LYMPHOCYTES: 2.7 10*3/uL (ref 0.8–3.5)
ABS. MONOCYTES: 0.4 10*3/uL (ref 0.0–1.0)
ABS. NEUTROPHILS: 4.9 10*3/uL (ref 1.8–8.0)
ABSOLUTE NRBC: 0 10*3/uL (ref 0.00–0.01)
BASOPHILS: 0 % (ref 0–1)
EOSINOPHILS: 2 % (ref 0–7)
HCT: 43.3 % (ref 35.0–47.0)
HGB: 14.7 g/dL (ref 11.5–16.0)
IMMATURE GRANULOCYTES: 0 % (ref 0.0–0.5)
LYMPHOCYTES: 33 % (ref 12–49)
MCH: 32 PG (ref 26.0–34.0)
MCHC: 33.9 g/dL (ref 30.0–36.5)
MCV: 94.1 FL (ref 80.0–99.0)
MONOCYTES: 5 % (ref 5–13)
MPV: 10.4 FL (ref 8.9–12.9)
NEUTROPHILS: 60 % (ref 32–75)
NRBC: 0 PER 100 WBC
PLATELET: 205 10*3/uL (ref 150–400)
RBC: 4.6 M/uL (ref 3.80–5.20)
RDW: 13.2 % (ref 11.5–14.5)
WBC: 8.2 10*3/uL (ref 3.6–11.0)

## 2018-11-29 LAB — EKG, 12 LEAD, INITIAL
Atrial Rate: 72 {beats}/min
Calculated P Axis: 44 degrees
Calculated R Axis: 49 degrees
Calculated T Axis: 47 degrees
Diagnosis: NORMAL
P-R Interval: 152 ms
Q-T Interval: 422 ms
QRS Duration: 82 ms
QTC Calculation (Bezet): 462 ms
Ventricular Rate: 72 {beats}/min

## 2018-11-29 LAB — TROPONIN I: Troponin-I, Qt.: <0.05 ng/mL (ref <0.05)

## 2018-11-29 LAB — SAMPLES BEING HELD

## 2018-11-29 LAB — NT-PRO BNP: NT pro-BNP: 136 PG/ML — ABNORMAL HIGH (ref <125)

## 2018-11-29 MED ORDER — FUROSEMIDE 10 MG/ML IJ SOLN
10 mg/mL | INTRAMUSCULAR | Status: AC
Start: 2018-11-29 — End: 2018-11-29
  Administered 2018-11-29: 04:00:00 via INTRAVENOUS

## 2018-11-29 MED FILL — FUROSEMIDE 10 MG/ML IJ SOLN: 10 mg/mL | INTRAMUSCULAR | Qty: 10

## 2018-11-29 NOTE — ED Notes (Signed)
 I have reviewed discharge instructions with the patient.  The patient verbalized understanding. Pt confirmed understanding of need for follow up with primary care provider.      Pt is not in any current distress and shows no evidence of clinical instability. Pt left ambulatory  Pt family/friends are present. Pt left with all personal belongings.     Pt states chief complaint has improved with treatment provided    PT is alert and oriented x 4, VSS    Paperwork given by provider and reviewed with patient, opportunity for questions/clarification given.    Patient Vitals for the past 4 hrs:   Pulse Resp BP SpO2   11/29/18 0230 71 29 128/47 92 %   11/29/18 0200 73 19 126/52 --   11/29/18 0130 85 (!) 31 140/48 94 %   11/29/18 0100 74 23 131/42 91 %   11/29/18 0045 74 24 125/57 91 %   11/29/18 0033 82 21 132/54 94 %   11/29/18 0022 77 -- 134/54 --   11/28/18 2335 -- -- -- 94 %   11/28/18 2333 76 18 142/79 94 %

## 2018-11-29 NOTE — ED Notes (Signed)
Rad at bedside for portable xray

## 2018-11-29 NOTE — ED Notes (Signed)
I have reviewed discharge instructions with the patient.  The patient verbalized understanding. Pt confirmed understanding of need for follow up with primary care provider.      Pt is not in any current distress and shows no evidence of clinical instability. Pt left ambulatory  Pt family/friends are present. Pt left with all personal belongings.     Pt states chief complaint has improved with treatment provided    PT is alert and oriented x 4, VSS    Paperwork given by provider and reviewed with patient, opportunity for questions/clarification given.    Patient Vitals for the past 4 hrs:   Pulse Resp BP SpO2   11/29/18 0230 71 29 128/47 92 %   11/29/18 0200 73 19 126/52 ???   11/29/18 0130 85 (!) 31 140/48 94 %   11/29/18 0100 74 23 131/42 91 %   11/29/18 0045 74 24 125/57 91 %   11/29/18 0033 82 21 132/54 94 %   11/29/18 0022 77 ??? 134/54 ???   11/28/18 2335 ??? ??? ??? 94 %   11/28/18 2333 76 18 142/79 94 %

## 2022-09-28 IMAGING — MR MRI CERVICAL SPINE WITHOUT CONTRAST
5 series · 48 of 48 positions shown · IV contrast (Off)
Comparison: Exam compared to prior exam from our facility dated 02/02/2019.

MRI OF THE CERVICAL SPINE WITHOUT CONTRAST
CLINICAL HISTORY: Worsening left-sided neck pain.
TECHNIQUE: Multisequential multiplanar imaging was performed of the cervical spinal region.

[Series 1: z scano sag/cor · coronal · 6.0mm · 1.02mm/px · 3 of 6 slices shown]
[im 1/6]
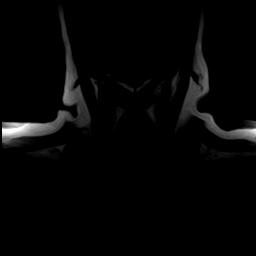
[im 3/6]
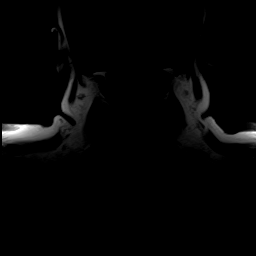
[im 6/6]
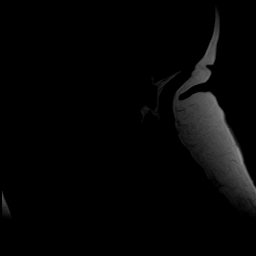

[Series 2: T2 · sagittal · 3.0mm · 0.94mm/px · 9 of 13 slices shown]
[im 1/13]
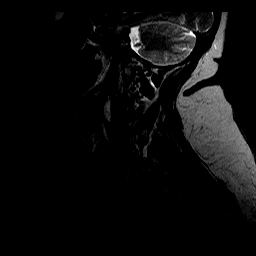
[im 2/13]
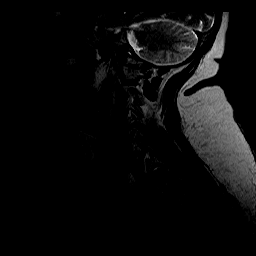
[im 4/13]
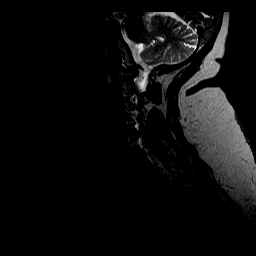
[im 5/13]
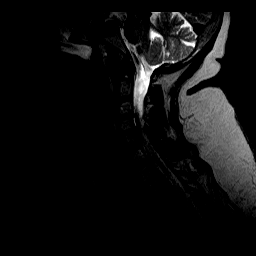
[im 7/13]
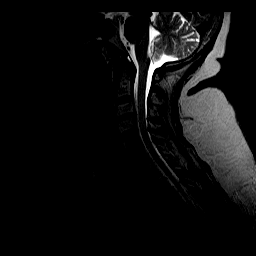
[im 8/13]
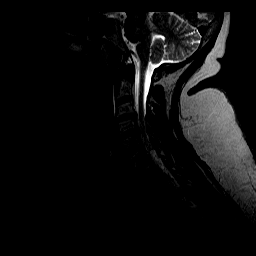
[im 10/13]
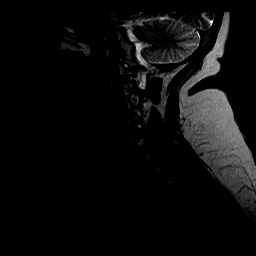
[im 11/13]
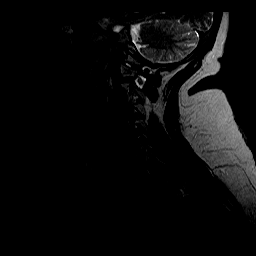
[im 13/13]
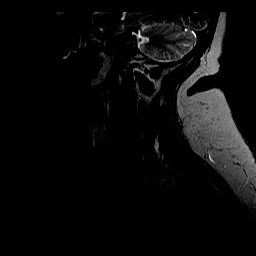

[Series 3: T1 · sagittal · 3.0mm · 0.94mm/px · 9 of 13 slices shown]
[im 1/13]
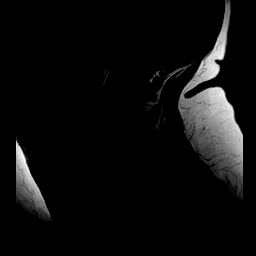
[im 2/13]
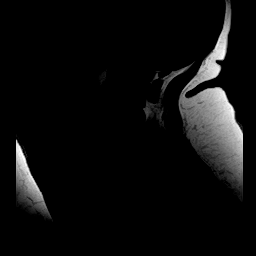
[im 4/13]
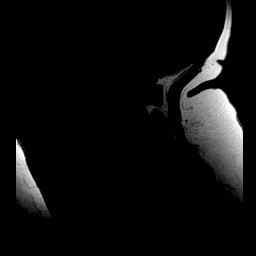
[im 5/13]
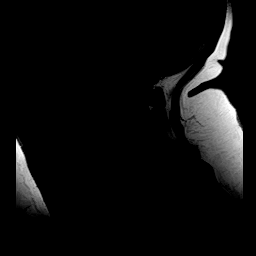
[im 7/13]
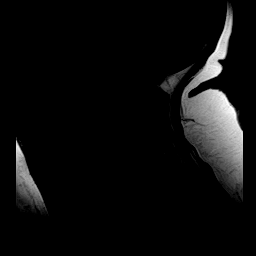
[im 8/13]
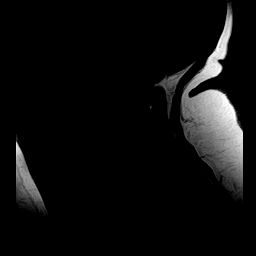
[im 10/13]
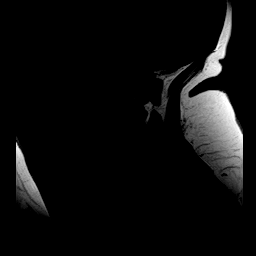
[im 11/13]
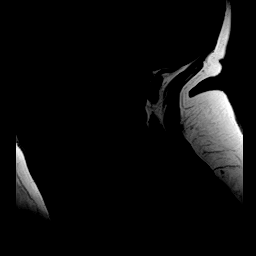
[im 13/13]
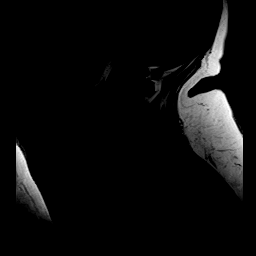

[Series 4: STIR · sagittal · 5.0mm · 0.94mm/px · 9 of 13 slices shown]
[im 1/13]
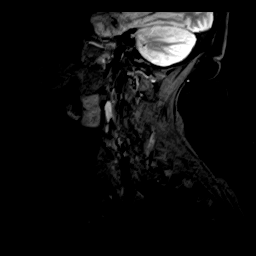
[im 2/13]
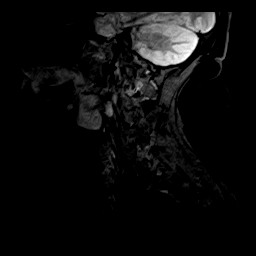
[im 4/13]
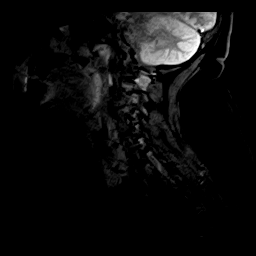
[im 5/13]
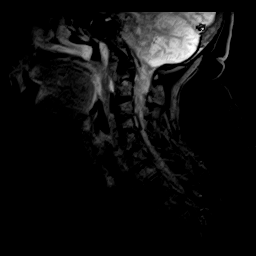
[im 7/13]
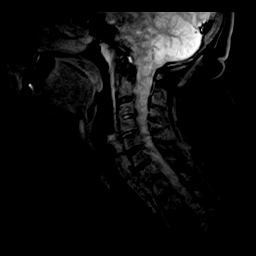
[im 8/13]
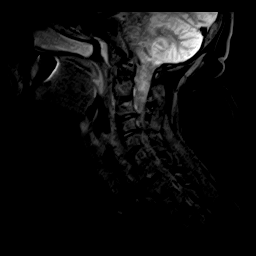
[im 10/13]
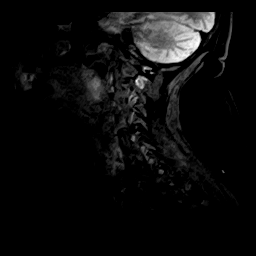
[im 11/13]
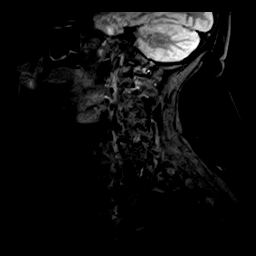
[im 13/13]
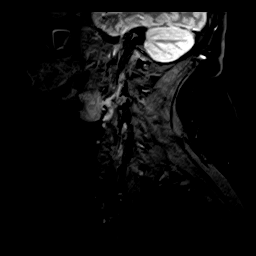

[Series 6: gr (person_name)/ mtc · axial · 3.0mm · 0.94mm/px · z∈[-95,+1]mm · 18 of 26 slices shown]
[im 1/26]
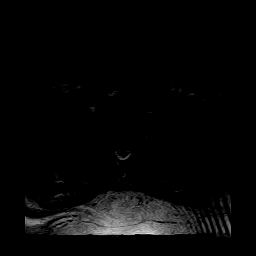
[im 2/26]
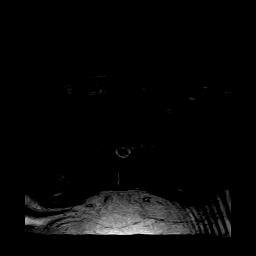
[im 3/26]
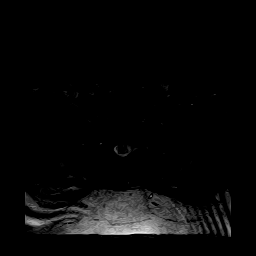
[im 5/26]
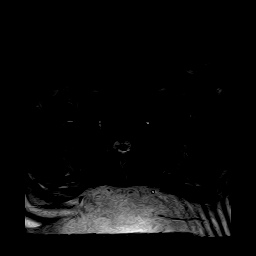
[im 6/26]
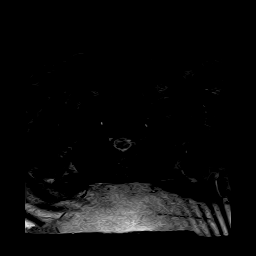
[im 8/26]
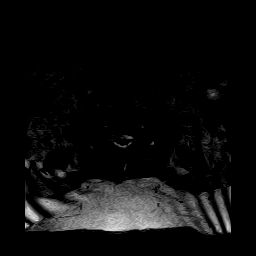
[im 9/26]
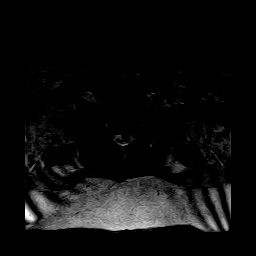
[im 11/26]
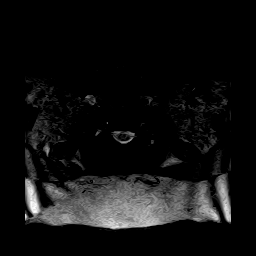
[im 12/26]
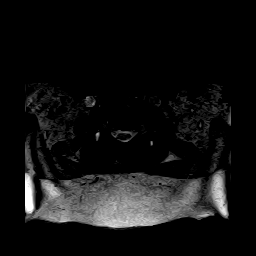
[im 14/26]
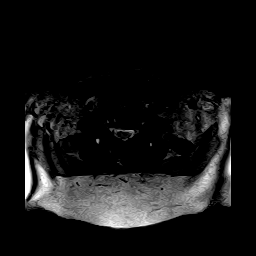
[im 15/26]
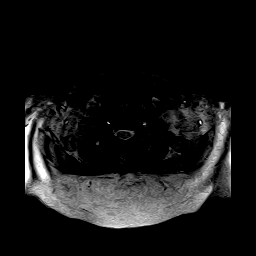
[im 17/26]
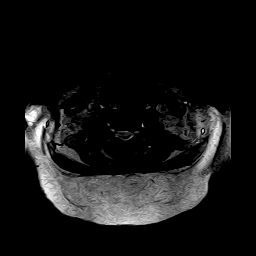
[im 18/26]
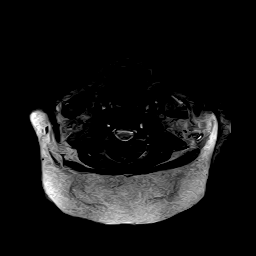
[im 20/26]
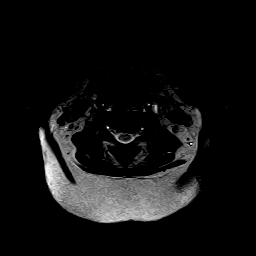
[im 21/26]
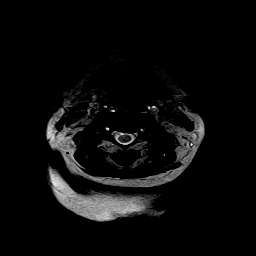
[im 23/26]
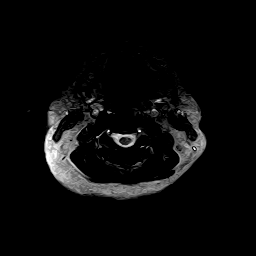
[im 24/26]
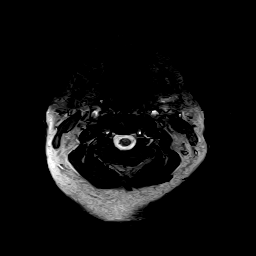
[im 26/26]
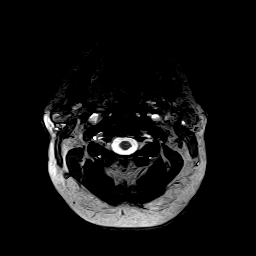

[48 of 48 positions shown; findings below may reference images not displayed]

FINDINGS: Normal marrow signal is noted throughout the cervical vertebral bodies. There is no abnormal signal identified of the cervical spinal cord. There is slight straightening of the cervical lordotic curvature. 

At C2-C3 level, there is no evidence of disc herniation, neural foraminal narrowing, or spinal stenosis. 

At C3-C4 level, there is 1.5 to 2 mm herniation asymmetric toward the left side. Mild left neural foraminal narrowing. 

At C4-C5 level, there is 2 to 2.5 mm broad-based herniation with early cord effacement. Moderate bilateral neural foraminal narrowing. 

At C5-C6 level, there is 1.5 mm bulging asymmetric toward the left side. There is mild to moderate left neural foraminal narrowing. 

At C6-C7 level, there is mild left neural foraminal narrowing. 

At C7-T1 level, there is a hemangioma in T1.
IMPRESSION: 1.
2 to 2.5 mm broad-based herniation with early cord effacement at C4-C5, 1.5 to 2 mm herniation asymmetric toward the left side at C3-C4, and 1.5 mm bulging asymmetric toward the left side at C5-C6. Visually these findings are unchanged. 

2.
Lateral osteophyte and uncovertebral hypertrophy are causing moderate bilateral neural foraminal narrowing at C4-C5, mild to moderate left neural foraminal narrowing at C5-C6, and mild left neural foraminal narrowing at C3-C4 and C6-C7. 

3.
Slight straightening of the cervical lordotic curvature may be secondary to positioning vs. muscle spasm.
